# Patient Record
Sex: Female | Born: 1965 | Race: Black or African American | Hispanic: No | Marital: Married | Smoking: Current every day smoker
Health system: Southern US, Community
[De-identification: ages and names within clinical notes are randomized; demographics above are authoritative.]

## PROBLEM LIST (undated history)

## (undated) DIAGNOSIS — F319 Bipolar disorder, unspecified: Secondary | ICD-10-CM

## (undated) DIAGNOSIS — B1920 Unspecified viral hepatitis C without hepatic coma: Secondary | ICD-10-CM

## (undated) DIAGNOSIS — Z800 Family history of malignant neoplasm of digestive organs: Secondary | ICD-10-CM

## (undated) DIAGNOSIS — K219 Gastro-esophageal reflux disease without esophagitis: Secondary | ICD-10-CM

## (undated) HISTORY — DX: Gastro-esophageal reflux disease without esophagitis: K21.9

## (undated) HISTORY — PX: COLON SURGERY: SHX602

## (undated) HISTORY — DX: Bipolar disorder, unspecified: F31.9

## (undated) HISTORY — DX: Unspecified viral hepatitis C without hepatic coma: B19.20

## (undated) HISTORY — DX: Family history of malignant neoplasm of digestive organs: Z80.0

## (undated) HISTORY — PX: OTHER SURGICAL HISTORY: SHX169

---

## 2011-11-22 ENCOUNTER — Emergency Department: Payer: Self-pay | Admitting: Emergency Medicine

## 2011-11-22 LAB — COMPREHENSIVE METABOLIC PANEL
Albumin: 3.9 g/dL (ref 3.4–5.0)
Anion Gap: 7 (ref 7–16)
Calcium, Total: 9 mg/dL (ref 8.5–10.1)
Chloride: 105 mmol/L (ref 98–107)
Creatinine: 0.82 mg/dL (ref 0.60–1.30)
Potassium: 3.1 mmol/L — ABNORMAL LOW (ref 3.5–5.1)
Total Protein: 8.1 g/dL (ref 6.4–8.2)

## 2011-11-22 LAB — URINALYSIS, COMPLETE
Glucose,UR: NEGATIVE mg/dL (ref 0–75)
Nitrite: NEGATIVE
Ph: 5 (ref 4.5–8.0)
Specific Gravity: 1.026 (ref 1.003–1.030)
Squamous Epithelial: 1
WBC UR: 3 /HPF (ref 0–5)

## 2011-11-22 LAB — CBC
HCT: 44.7 % (ref 35.0–47.0)
MCH: 27.6 pg (ref 26.0–34.0)
MCV: 85 fL (ref 80–100)
Platelet: 271 10*3/uL (ref 150–440)
RDW: 13.2 % (ref 11.5–14.5)

## 2011-11-22 LAB — DRUG SCREEN, URINE
Cocaine Metabolite,Ur ~~LOC~~: POSITIVE (ref ?–300)
MDMA (Ecstasy)Ur Screen: POSITIVE (ref ?–500)
Methadone, Ur Screen: NEGATIVE (ref ?–300)
Phencyclidine (PCP) Ur S: NEGATIVE (ref ?–25)
Tricyclic, Ur Screen: NEGATIVE (ref ?–1000)

## 2011-11-22 LAB — ETHANOL
Ethanol %: <0.003 % (ref 0.000–0.080)
Ethanol: <3 mg/dL

## 2011-11-22 LAB — MAGNESIUM: Magnesium: 2 mg/dL

## 2011-11-22 LAB — TSH: Thyroid Stimulating Horm: 0.13 u[IU]/mL — ABNORMAL LOW

## 2012-02-07 ENCOUNTER — Emergency Department: Payer: Self-pay | Admitting: Emergency Medicine

## 2012-05-02 ENCOUNTER — Inpatient Hospital Stay: Payer: Self-pay | Admitting: Psychiatry

## 2012-05-02 LAB — URINALYSIS, COMPLETE
Bacteria: NONE SEEN
Bilirubin,UR: NEGATIVE
Ketone: NEGATIVE
Ph: 5 (ref 4.5–8.0)
RBC,UR: 4 /HPF (ref 0–5)
Squamous Epithelial: 6
WBC UR: 30 /HPF (ref 0–5)

## 2012-05-02 LAB — CBC
HGB: 13.3 g/dL (ref 12.0–16.0)
MCH: 29 pg (ref 26.0–34.0)
MCHC: 33.3 g/dL (ref 32.0–36.0)
Platelet: 271 10*3/uL (ref 150–440)
RBC: 4.58 10*6/uL (ref 3.80–5.20)

## 2012-05-02 LAB — COMPREHENSIVE METABOLIC PANEL
Albumin: 3.7 g/dL (ref 3.4–5.0)
Alkaline Phosphatase: 88 U/L (ref 50–136)
Anion Gap: 10 (ref 7–16)
BUN: 6 mg/dL — ABNORMAL LOW (ref 7–18)
Creatinine: 0.62 mg/dL (ref 0.60–1.30)
Glucose: 71 mg/dL (ref 65–99)
Osmolality: 281 (ref 275–301)
SGOT(AST): 33 U/L (ref 15–37)
SGPT (ALT): 26 U/L (ref 12–78)
Total Protein: 7.5 g/dL (ref 6.4–8.2)

## 2012-05-02 LAB — DRUG SCREEN, URINE
Barbiturates, Ur Screen: NEGATIVE (ref ?–200)
Cannabinoid 50 Ng, Ur ~~LOC~~: NEGATIVE (ref ?–50)
Cocaine Metabolite,Ur ~~LOC~~: NEGATIVE (ref ?–300)
Methadone, Ur Screen: NEGATIVE (ref ?–300)
Opiate, Ur Screen: NEGATIVE (ref ?–300)
Phencyclidine (PCP) Ur S: NEGATIVE (ref ?–25)
Tricyclic, Ur Screen: NEGATIVE (ref ?–1000)

## 2012-05-03 LAB — LIPID PANEL
Cholesterol: 168 mg/dL (ref 0–200)
HDL Cholesterol: 66 mg/dL — ABNORMAL HIGH (ref 40–60)
Ldl Cholesterol, Calc: 89 mg/dL (ref 0–100)
VLDL Cholesterol, Calc: 13 mg/dL (ref 5–40)

## 2012-05-03 LAB — TSH: Thyroid Stimulating Horm: 0.315 u[IU]/mL — ABNORMAL LOW

## 2012-05-24 ENCOUNTER — Emergency Department: Payer: Self-pay | Admitting: Emergency Medicine

## 2012-05-29 LAB — CBC
HCT: 45.9 % (ref 35.0–47.0)
MCH: 29.6 pg (ref 26.0–34.0)
MCHC: 33.5 g/dL (ref 32.0–36.0)
MCV: 88 fL (ref 80–100)
Platelet: 231 10*3/uL (ref 150–440)
RDW: 13.7 % (ref 11.5–14.5)

## 2012-05-29 LAB — URINALYSIS, COMPLETE
Blood: NEGATIVE
Nitrite: NEGATIVE
Ph: 6 (ref 4.5–8.0)
Protein: NEGATIVE
Specific Gravity: 1.003 (ref 1.003–1.030)
Squamous Epithelial: 4

## 2012-05-29 LAB — COMPREHENSIVE METABOLIC PANEL
Albumin: 4.1 g/dL (ref 3.4–5.0)
Alkaline Phosphatase: 96 U/L (ref 50–136)
Anion Gap: 10 (ref 7–16)
Calcium, Total: 9.4 mg/dL (ref 8.5–10.1)
Chloride: 103 mmol/L (ref 98–107)
Co2: 25 mmol/L (ref 21–32)
EGFR (African American): >60
EGFR (Non-African Amer.): >60
Glucose: 69 mg/dL (ref 65–99)
Potassium: 3.7 mmol/L (ref 3.5–5.1)
SGOT(AST): 23 U/L (ref 15–37)
SGPT (ALT): 15 U/L (ref 12–78)
Total Protein: 8.6 g/dL — ABNORMAL HIGH (ref 6.4–8.2)

## 2012-05-29 LAB — DRUG SCREEN, URINE
Benzodiazepine, Ur Scrn: NEGATIVE (ref ?–200)
Cocaine Metabolite,Ur ~~LOC~~: NEGATIVE (ref ?–300)
Opiate, Ur Screen: NEGATIVE (ref ?–300)
Phencyclidine (PCP) Ur S: NEGATIVE (ref ?–25)
Tricyclic, Ur Screen: NEGATIVE (ref ?–1000)

## 2012-05-29 LAB — PREGNANCY, URINE: Pregnancy Test, Urine: NEGATIVE m[IU]/mL

## 2012-05-29 LAB — ETHANOL: Ethanol %: <0.003 % (ref 0.000–0.080)

## 2012-06-02 ENCOUNTER — Inpatient Hospital Stay: Payer: Self-pay | Admitting: Psychiatry

## 2012-06-03 LAB — URINALYSIS, COMPLETE
Bacteria: NONE SEEN
Blood: NEGATIVE
Ketone: NEGATIVE
Leukocyte Esterase: NEGATIVE
Protein: NEGATIVE
RBC,UR: 1 /HPF (ref 0–5)
Specific Gravity: 1.004 (ref 1.003–1.030)
Squamous Epithelial: <1

## 2012-06-06 LAB — VALPROIC ACID LEVEL: Valproic Acid: 107 ug/mL — ABNORMAL HIGH

## 2012-06-06 LAB — AMMONIA: Ammonia, Plasma: 50 mcmol/L — ABNORMAL HIGH (ref 11–32)

## 2012-06-18 LAB — COMPREHENSIVE METABOLIC PANEL
Albumin: 3.3 g/dL — ABNORMAL LOW (ref 3.4–5.0)
BUN: 5 mg/dL — ABNORMAL LOW (ref 7–18)
Bilirubin,Total: 0.2 mg/dL (ref 0.2–1.0)
Calcium, Total: 8.5 mg/dL (ref 8.5–10.1)
Chloride: 102 mmol/L (ref 98–107)
Co2: 29 mmol/L (ref 21–32)
Creatinine: 0.72 mg/dL (ref 0.60–1.30)
EGFR (Non-African Amer.): >60
Potassium: 4.1 mmol/L (ref 3.5–5.1)
Sodium: 139 mmol/L (ref 136–145)
Total Protein: 7.4 g/dL (ref 6.4–8.2)

## 2012-06-18 LAB — CBC
HCT: 36.6 % (ref 35.0–47.0)
HGB: 12 g/dL (ref 12.0–16.0)
Platelet: 203 10*3/uL (ref 150–440)
RBC: 4.13 10*6/uL (ref 3.80–5.20)
WBC: 4 10*3/uL (ref 3.6–11.0)

## 2012-06-18 LAB — AMMONIA: Ammonia, Plasma: 42 mcmol/L — ABNORMAL HIGH (ref 11–32)

## 2012-10-02 ENCOUNTER — Emergency Department: Payer: Self-pay | Admitting: Emergency Medicine

## 2012-10-02 LAB — COMPREHENSIVE METABOLIC PANEL
Alkaline Phosphatase: 89 U/L (ref 50–136)
BUN: 7 mg/dL (ref 7–18)
Bilirubin,Total: 0.4 mg/dL (ref 0.2–1.0)
Calcium, Total: 8.9 mg/dL (ref 8.5–10.1)
Chloride: 104 mmol/L (ref 98–107)
Co2: 30 mmol/L (ref 21–32)
Creatinine: 0.71 mg/dL (ref 0.60–1.30)
EGFR (African American): >60
Glucose: 97 mg/dL (ref 65–99)
Potassium: 3.8 mmol/L (ref 3.5–5.1)
SGOT(AST): 17 U/L (ref 15–37)
SGPT (ALT): 14 U/L (ref 12–78)
Sodium: 139 mmol/L (ref 136–145)
Total Protein: 7.4 g/dL (ref 6.4–8.2)

## 2012-10-02 LAB — DRUG SCREEN, URINE
Amphetamines, Ur Screen: NEGATIVE (ref ?–1000)
Cannabinoid 50 Ng, Ur ~~LOC~~: NEGATIVE (ref ?–50)
Cocaine Metabolite,Ur ~~LOC~~: NEGATIVE (ref ?–300)
Methadone, Ur Screen: NEGATIVE (ref ?–300)
Phencyclidine (PCP) Ur S: NEGATIVE (ref ?–25)

## 2012-10-02 LAB — URINALYSIS, COMPLETE
Bacteria: NONE SEEN
Ph: 6 (ref 4.5–8.0)
Protein: NEGATIVE
RBC,UR: 1 /HPF (ref 0–5)
Specific Gravity: 1.024 (ref 1.003–1.030)
WBC UR: 2 /HPF (ref 0–5)

## 2012-10-02 LAB — TSH: Thyroid Stimulating Horm: 0.65 u[IU]/mL

## 2012-10-02 LAB — VALPROIC ACID LEVEL: Valproic Acid: 69 ug/mL

## 2012-10-02 LAB — CBC
HCT: 40.3 % (ref 35.0–47.0)
HGB: 13.6 g/dL (ref 12.0–16.0)
MCH: 30.3 pg (ref 26.0–34.0)
MCV: 90 fL (ref 80–100)
RDW: 12.1 % (ref 11.5–14.5)

## 2012-10-02 LAB — ETHANOL: Ethanol %: <0.003 % (ref 0.000–0.080)

## 2014-10-19 NOTE — Consult Note (Signed)
PATIENT NAME:  [name] Ball, [name] Ball MR#:  [id number] DATE OF BIRTH:  1966-03-21  DATE OF CONSULTATION:  05/31/2012  REFERRING PHYSICIAN:   CONSULTING PHYSICIAN:  Devion Chriscoe K. Henrietta Cieslewicz, MD  SUBJECTIVE: Staff reports that the patient has been agitated and upset during early hours of the morning but currently she has calmed down. The patient was started on her medications from the group home which will be continued.   OBJECTIVE: The patient is seen laying in the bed, appears sleepy. She is alert and oriented but she continues to have delusional thinking and some of the delusions are fixed and gets agitated during the day. Currently she is calm. Behavior is unpredictable and she can get very upset and argumentative with agitation. Insight and judgment guarded versus impaired at this time.   IMPRESSION: Schizoaffective disorder with psychosis.   PLAN: Continue current medications and watch. When the patient is stabilized, she will be considered to be moved to inpatient unit for further help.   ____________________________ Wallace Cullens. Franchot Mimes, MD skc:drc D: 05/31/2012 20:36:32 ET T: 06/01/2012 11:43:17 ET JOB#: [id number]  cc: Arlyn Leak K. Franchot Mimes, MD, <Dictator> Dewain Penning MD ELECTRONICALLY SIGNED 06/01/2012 16:26
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
## 2014-10-19 NOTE — Consult Note (Signed)
Brief Consult Note: Diagnosis: cocaine abuse.   Patient was seen by consultant.   Consult note dictated.   Discussed with Attending MD.   Comments: Psychiatry: Patient seen. She is currently dening any suicidal ideation and is calm and cooperative. Currently lucid. Patient not acutely dangerous and not likely to benefit from hospital. Releaase IVC. Advise dc from ER.  Electronic Signatures: Gonzella Lex (MD)  (Signed 08-Aug-13 17:37)  Authored: Brief Consult Note   Last Updated: 08-Aug-13 17:37 by Gonzella Lex (MD)

## 2014-10-19 NOTE — Discharge Summary (Signed)
PATIENT NAME:  [name] Ball, [name] Ball MR#:  [id number] DATE OF BIRTH:  09/07/1965  DATE OF ADMISSION:  05/02/2012 DATE OF DISCHARGE:  05/09/2012  HOSPITAL COURSE: See dictated history and physical for details of admission. This is a 49 year old woman with a history of schizophrenia who was petitioned in the Emergency Room because of her obvious psychosis and bizarre behavior. Her insight remained impaired for most of her hospital stay. She was often argumentative. She was not, however, hostile or violent and did not display any suicidal behavior. She was passively cooperative with medication but did not participate very well for many days with programming. She was given daily information and supportive therapy about the diagnosis and the purpose of treatment. We also worked with her around her living situation. The patient does have a guardian and is going to be seen by the ACT team at her group home but the responsibility has recently changed and they are only barely beginning to be familiar with her. Eventually the patient was able to have a reasonable agreement and negotiate medicine that she thought was appropriate. It turned out that she thought that Risperdal and Depakote were appropriate medications. I thought that was a completely reasonable choice as well and her medicine was changed to Risperdal and Depakote. She tolerated these fine and actually did seem to be less psychotic and less bizarre, although if one got her on her delusional topics they were still present. 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 Fortunately, it turns out her Hepatitis B, her syphilis, and her HIV were all negative. Hepatitis C was negative. She had concerns about her thyroid function which is understandable given her appearance which does look hyperthyroid. However, it did not actually  appear from the testing that her thyroid condition required any further change. She had slightly low TSH. She is referred back to her outpatient physician for follow-up treatment with this. Prior to discharge the patient was counseled about the use of long-acting injectable medications. She actually consented to have a Risperdal decanoate shot done and was given 37.5 mg of Risperdal Sustenna prior to discharge. She tolerated this fine. The ACT team felt more confident with her likelihood of outpatient compliance. All in all the patient seemed to be functioning much better, have a more reasonable plan for the future, show good insight about the importance of staying on medication and staying away from drugs. No acute dangerousness at discharge.   LABORATORY DATA: CBC on admission normal. Chemistry panel just showed a slightly low BUN at 6, chloride slightly elevated at 108. Alcohol undetectable. TSH slightly low at 0.35. Urinalysis showed positive leukocyte esterase, multiple white blood cells. Drug screen was positive for MDMA. Lipid panel showed an elevated HDL at 66, total cholesterol 168, triglycerides 66. Generally good news. Follow-up thyroid was still only slightly low at 0.3. Thyroid panel showed that while she had a low TSH her T4 was normal at 8.4 as was her T3 uptake and free thyroxine. HIV test was nonreactive. Hepatitis C test was negative. Hepatitis B surface antigen was negative. RPR titer was nonreactive.   DISCHARGE MEDICATIONS:  1. Risperdal long-acting 37.5 mg q.2 weeks with the next dose due about November 20th.   2. Depakote 500 mg per day.  3. Doxycycline 100 mg q.12 hours for another 7 days. 4. Risperdal oral 1 mg twice a day.  5. Desyrel 100  mg at night.  6. Hydroxyzine 50 mg q.4 hours p.r.n. for agitation.  7. The patient was treated with Macrodantin for five days in the hospital for the urinary tract infection.   MENTAL STATUS EXAM AT DISCHARGE: Reasonably well groomed, casually  dressed woman, looks her stated age. Good eye contact. Normal psychomotor activity during the interview, although at times she will still rock herself back and forth when she's sitting alone. It does not appear to be tardive dyskinesia or akathisia. Affect was euthymic and reactive. Smiling. Thoughts were lucid. Generally no loosening of associations. No bizarre content to her speech. Still has evidence of some delusional content if one asks about the right things such as whether she was a secret agent when she was a baby. Denies auditory or visual hallucinations. Denies suicidal or homicidal ideation. Baseline intelligence at least average, possibly elevated. Short and long-term memory appear to be grossly intact. Insight and judgment still a little bit impaired but certainly better.   DISPOSITION: She is being discharged back to a group home with follow-up by the ACT team.   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   SECONDARY DIAGNOSES:  AXIS I: History of polysubstance dependence, in early remission.   AXIS II: Deferred.   AXIS III: Urinary tract infection, currently treated.   AXIS IV: Severe from chronic burden of illness, having to change her environment recently, being away from her family.   AXIS V: Functioning at time of discharge 56.   ____________________________ Gonzella Lex, MD jtc:drc D: 05/09/2012 14:51:50 ET T: 05/10/2012 15:22:12 ET JOB#: [id number]  cc: Gonzella Lex, MD, <Dictator> Gonzella Lex MD ELECTRONICALLY SIGNED 05/12/2012 10:48
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
## 2014-10-24 NOTE — Consult Note (Signed)
Brief Consult Note: Diagnosis: schizophrenia.   Patient was seen by consultant.   Consult note dictated.   Comments: Psychiatry: Patient seen.She has a past history of schizophrenia and substance abuse. Recent move to area. Called 911 herself to come to ER. Complains of anxiety. Denies any suicidal or homicidal ideation. No active positive psychotic symptoms. Currently calm. Alreadt set up for ACT treatment at group home. Suggest she beb discharged back to her group home. She agrees.  Electronic Signatures: Gonzella Lex (MD)  (Signed 24-May-13 15:45)  Authored: Brief Consult Note   Last Updated: 24-May-13 15:45 by Gonzella Lex (MD)

## 2016-03-14 DIAGNOSIS — K219 Gastro-esophageal reflux disease without esophagitis: Secondary | ICD-10-CM | POA: Insufficient documentation

## 2016-03-14 DIAGNOSIS — K2100 Gastro-esophageal reflux disease with esophagitis, without bleeding: Secondary | ICD-10-CM

## 2016-03-14 DIAGNOSIS — B1920 Unspecified viral hepatitis C without hepatic coma: Secondary | ICD-10-CM | POA: Insufficient documentation

## 2016-03-14 DIAGNOSIS — F319 Bipolar disorder, unspecified: Secondary | ICD-10-CM | POA: Insufficient documentation

## 2016-03-14 DIAGNOSIS — F3111 Bipolar disorder, current episode manic without psychotic features, mild: Secondary | ICD-10-CM

## 2016-03-15 ENCOUNTER — Encounter: Payer: Self-pay | Admitting: Gastroenterology

## 2016-03-15 ENCOUNTER — Ambulatory Visit (INDEPENDENT_AMBULATORY_CARE_PROVIDER_SITE_OTHER): Payer: Medicaid Other | Admitting: Gastroenterology

## 2016-03-15 DIAGNOSIS — B1920 Unspecified viral hepatitis C without hepatic coma: Secondary | ICD-10-CM | POA: Diagnosis not present

## 2016-03-15 NOTE — Patient Instructions (Signed)
You are scheduled for a RUQ abdominal US/tissue elastography at [facility] on Wednesday, Sept 20th @ 9:30am. Please arrive at 9:15am and check in at the medical mall. You are NOT to eat or drink Tuesday after midnight. If you need to rescheduled for any reason, please call [phone].
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
## 2016-03-21 ENCOUNTER — Ambulatory Visit: Payer: Self-pay

## 2016-03-26 ENCOUNTER — Ambulatory Visit
Admission: RE | Admit: 2016-03-26 | Discharge: 2016-03-26 | Disposition: A | Discharge status: Home / Self Care | Payer: Medicaid Other | Source: Ambulatory Visit | Attending: Gastroenterology | Admitting: Gastroenterology

## 2016-03-26 DIAGNOSIS — B1920 Unspecified viral hepatitis C without hepatic coma: Secondary | ICD-10-CM | POA: Diagnosis present

## 2016-03-26 DIAGNOSIS — R932 Abnormal findings on diagnostic imaging of liver and biliary tract: Secondary | ICD-10-CM | POA: Insufficient documentation

## 2016-03-28 ENCOUNTER — Telehealth: Payer: Self-pay

## 2016-03-28 NOTE — Telephone Encounter (Signed)
Contacted group home and results given to nurse. Called [phone].

## 2016-03-28 NOTE — Telephone Encounter (Signed)
Opened encounter in error  

## 2016-03-28 NOTE — Telephone Encounter (Signed)
-----   Message from Lucilla Lame, MD sent at 03/27/2016  7:58 AM EDT ----- Let this patient know that her hepatitis C viral load is negative and her antibodies positive meaning that she has either been treated or resolved the infection on her own. She should have her viral level checked again in 1 year.

## 2016-03-30 LAB — CBC WITH DIFFERENTIAL/PLATELET
BASOS ABS: 0 10*3/uL (ref 0.0–0.2)
BASOS: 0 %
EOS (ABSOLUTE): 0.1 10*3/uL (ref 0.0–0.4)
Eos: 2 %
Hematocrit: 41.4 % (ref 34.0–46.6)
Hemoglobin: 13.9 g/dL (ref 11.1–15.9)
Immature Grans (Abs): 0 10*3/uL (ref 0.0–0.1)
Immature Granulocytes: 0 %
Lymphocytes Absolute: 2.2 10*3/uL (ref 0.7–3.1)
Lymphs: 38 %
MCH: 29.6 pg (ref 26.6–33.0)
MCHC: 33.6 g/dL (ref 31.5–35.7)
MCV: 88 fL (ref 79–97)
MONOS ABS: 0.5 10*3/uL (ref 0.1–0.9)
Monocytes: 9 %
NEUTROS ABS: 2.8 10*3/uL (ref 1.4–7.0)
Neutrophils: 51 %
PLATELETS: 288 10*3/uL (ref 150–379)
RBC: 4.7 x10E6/uL (ref 3.77–5.28)
RDW: 13.6 % (ref 12.3–15.4)
WBC: 5.7 10*3/uL (ref 3.4–10.8)

## 2016-03-30 LAB — HEPATIC FUNCTION PANEL
ALT: 6 IU/L (ref 0–32)
AST: 14 IU/L (ref 0–40)
Albumin: 4.3 g/dL (ref 3.5–5.5)
Alkaline Phosphatase: 93 IU/L (ref 39–117)
Bilirubin Total: 0.4 mg/dL (ref 0.0–1.2)
Bilirubin, Direct: 0.11 mg/dL (ref 0.00–0.40)
Total Protein: 7.5 g/dL (ref 6.0–8.5)

## 2016-03-30 LAB — HEPATITIS B SURFACE ANTIGEN: HEP B S AG: NEGATIVE

## 2016-03-30 LAB — ANA: Anti Nuclear Antibody(ANA): NEGATIVE

## 2016-03-30 LAB — ANTI-SMOOTH MUSCLE ANTIBODY, IGG: Smooth Muscle Ab: 37 Units — ABNORMAL HIGH (ref 0–19)

## 2016-03-30 LAB — MITOCHONDRIAL ANTIBODIES: Mitochondrial Ab: 12.2 Units (ref 0.0–20.0)

## 2016-03-30 LAB — HEPATITIS A ANTIBODY, TOTAL: HEP A TOTAL AB: NEGATIVE

## 2016-03-30 LAB — HEPATITIS C GENOTYPE

## 2016-03-30 LAB — HCV RT-PCR, QUANT (NON-GRAPH)

## 2016-03-30 LAB — IGG, IGA, IGM
IgA/Immunoglobulin A, Serum: 198 mg/dL (ref 87–352)
IgG (Immunoglobin G), Serum: 1453 mg/dL (ref 700–1600)
IgM (Immunoglobulin M), Srm: 65 mg/dL (ref 26–217)

## 2016-03-30 LAB — HEPATITIS B SURFACE ANTIBODY,QUALITATIVE: HEP B SURFACE AB, QUAL: NONREACTIVE

## 2016-03-30 LAB — PROTIME-INR
INR: 1 (ref 0.8–1.2)
PROTHROMBIN TIME: 10.8 s (ref 9.1–12.0)

## 2016-03-30 LAB — CERULOPLASMIN: Ceruloplasmin: 36.9 mg/dL (ref 19.0–39.0)

## 2016-03-30 LAB — HEPATITIS C ANTIBODY: Hep C Virus Ab: 4.7 s/co ratio — ABNORMAL HIGH (ref 0.0–0.9)

## 2016-03-30 LAB — ALPHA-1-ANTITRYPSIN: A1 ANTITRYPSIN: 144 mg/dL (ref 90–200)

## 2017-03-27 ENCOUNTER — Emergency Department (HOSPITAL_COMMUNITY): Payer: Medicaid Other

## 2017-03-27 ENCOUNTER — Emergency Department (HOSPITAL_COMMUNITY)
Admission: EM | Admit: 2017-03-27 | Discharge: 2017-03-27 | Disposition: A | Discharge status: Home / Self Care | Payer: Medicaid Other | Attending: Emergency Medicine | Admitting: Emergency Medicine

## 2017-03-27 ENCOUNTER — Encounter (HOSPITAL_COMMUNITY): Payer: Self-pay | Admitting: *Deleted

## 2017-03-27 DIAGNOSIS — Z79899 Other long term (current) drug therapy: Secondary | ICD-10-CM | POA: Insufficient documentation

## 2017-03-27 DIAGNOSIS — F17200 Nicotine dependence, unspecified, uncomplicated: Secondary | ICD-10-CM | POA: Diagnosis not present

## 2017-03-27 DIAGNOSIS — F22 Delusional disorders: Secondary | ICD-10-CM | POA: Insufficient documentation

## 2017-03-27 DIAGNOSIS — R42 Dizziness and giddiness: Secondary | ICD-10-CM | POA: Insufficient documentation

## 2017-03-27 LAB — URINALYSIS, ROUTINE W REFLEX MICROSCOPIC
BILIRUBIN URINE: NEGATIVE
Glucose, UA: NEGATIVE mg/dL
Hgb urine dipstick: NEGATIVE
KETONES UR: 5 mg/dL — AB
LEUKOCYTES UA: NEGATIVE
Nitrite: NEGATIVE
PH: 5 (ref 5.0–8.0)
Protein, ur: NEGATIVE mg/dL
SPECIFIC GRAVITY, URINE: 1.024 (ref 1.005–1.030)

## 2017-03-27 LAB — COMPREHENSIVE METABOLIC PANEL
ALBUMIN: 3.8 g/dL (ref 3.5–5.0)
ALT: 14 U/L (ref 14–54)
ANION GAP: 10 (ref 5–15)
AST: 26 U/L (ref 15–41)
Alkaline Phosphatase: 59 U/L (ref 38–126)
BUN: 15 mg/dL (ref 6–20)
CHLORIDE: 103 mmol/L (ref 101–111)
CO2: 26 mmol/L (ref 22–32)
CREATININE: 0.73 mg/dL (ref 0.44–1.00)
Calcium: 9.4 mg/dL (ref 8.9–10.3)
GFR calc non Af Amer: >60 mL/min (ref >60)
Glucose, Bld: 80 mg/dL (ref 65–99)
POTASSIUM: 4.1 mmol/L (ref 3.5–5.1)
SODIUM: 139 mmol/L (ref 135–145)
Total Bilirubin: 0.6 mg/dL (ref 0.3–1.2)
Total Protein: 7.2 g/dL (ref 6.5–8.1)

## 2017-03-27 LAB — CBC WITH DIFFERENTIAL/PLATELET
BASOS PCT: 0 %
Basophils Absolute: 0 10*3/uL (ref 0.0–0.1)
EOS ABS: 0 10*3/uL (ref 0.0–0.7)
EOS PCT: 1 %
HCT: 39.9 % (ref 36.0–46.0)
Hemoglobin: 13.3 g/dL (ref 12.0–15.0)
LYMPHS ABS: 1.4 10*3/uL (ref 0.7–4.0)
Lymphocytes Relative: 33 %
MCH: 29.9 pg (ref 26.0–34.0)
MCHC: 33.3 g/dL (ref 30.0–36.0)
MCV: 89.7 fL (ref 78.0–100.0)
MONO ABS: 0.5 10*3/uL (ref 0.1–1.0)
MONOS PCT: 11 %
Neutro Abs: 2.3 10*3/uL (ref 1.7–7.7)
Neutrophils Relative %: 55 %
Platelets: 184 10*3/uL (ref 150–400)
RBC: 4.45 MIL/uL (ref 3.87–5.11)
RDW: 12.1 % (ref 11.5–15.5)
WBC: 4.1 10*3/uL (ref 4.0–10.5)

## 2017-03-27 LAB — RAPID URINE DRUG SCREEN, HOSP PERFORMED
AMPHETAMINES: NOT DETECTED
BENZODIAZEPINES: POSITIVE — AB
Barbiturates: NOT DETECTED
COCAINE: NOT DETECTED
OPIATES: NOT DETECTED
TETRAHYDROCANNABINOL: NOT DETECTED

## 2017-03-27 LAB — I-STAT BETA HCG BLOOD, ED (MC, WL, AP ONLY)

## 2017-03-27 LAB — ETHANOL

## 2017-03-27 MED ORDER — QUETIAPINE FUMARATE 100 MG PO TABS
50.0000 mg | ORAL_TABLET | Freq: Every day | ORAL | Status: DC
Start: 2017-03-27 — End: 2017-03-27
  Administered 2017-03-27: 50 mg via ORAL
  Filled 2017-03-27: qty 1

## 2017-03-27 MED ORDER — QUETIAPINE FUMARATE 50 MG PO TABS: 50.0000 mg | ORAL_TABLET | Freq: Every day | ORAL | 0 refills | Status: DC

## 2017-03-27 NOTE — ED Notes (Signed)
Has eaten meal   A bit restless but easily re-directed

## 2017-03-27 NOTE — ED Notes (Signed)
Meal provided 

## 2017-03-27 NOTE — ED Notes (Signed)
Awaiting reeval and disposition

## 2017-03-27 NOTE — ED Notes (Signed)
Pt insistant that she go outside to smoke

## 2017-03-27 NOTE — ED Notes (Signed)
Call to [facility] they will call back

## 2017-03-27 NOTE — ED Notes (Signed)
Out of bed awaiting results

## 2017-03-27 NOTE — ED Triage Notes (Signed)
Pt brought in by [facility] from [facility]. EMS reports that pt's medication, Haldol was changed on 03/20/17 and since then pt has dizzy, stumbling around and unsteady on her feet. EMS reports that pt has not fallen since this change in medication. Pt is alert and verbal, but pt is not able to carry conversation without getting very sidetracked. Pt alert to self and place, disoriented to time.

## 2017-03-28 ENCOUNTER — Emergency Department (HOSPITAL_COMMUNITY)
Admission: EM | Admit: 2017-03-28 | Discharge: 2017-03-28 | Disposition: A | Discharge status: Home / Self Care | Payer: Medicaid Other | Attending: Emergency Medicine | Admitting: Emergency Medicine

## 2017-03-28 ENCOUNTER — Encounter (HOSPITAL_COMMUNITY): Payer: Self-pay

## 2017-03-28 DIAGNOSIS — Z79899 Other long term (current) drug therapy: Secondary | ICD-10-CM | POA: Diagnosis not present

## 2017-03-28 DIAGNOSIS — R4589 Other symptoms and signs involving emotional state: Secondary | ICD-10-CM | POA: Diagnosis not present

## 2017-03-28 DIAGNOSIS — F319 Bipolar disorder, unspecified: Secondary | ICD-10-CM | POA: Insufficient documentation

## 2017-03-28 DIAGNOSIS — F17200 Nicotine dependence, unspecified, uncomplicated: Secondary | ICD-10-CM | POA: Diagnosis not present

## 2017-03-28 DIAGNOSIS — F69 Unspecified disorder of adult personality and behavior: Secondary | ICD-10-CM

## 2017-03-28 MED ORDER — QUETIAPINE FUMARATE 100 MG PO TABS
50.0000 mg | ORAL_TABLET | Freq: Every day | ORAL | Status: DC
  Administered 2017-03-28: 50 mg via ORAL
  Filled 2017-03-28: qty 1

## 2017-03-28 NOTE — ED Provider Notes (Signed)
[facility] DEPT Provider Note   CSN: [id number] Arrival date & time: 03/28/17  1921     History   Chief Complaint Chief Complaint  Patient presents with  . Medical Clearance    HPI [name] Ball is a 51 y.o. female.  HPI   43yF sent for evaluation of aggressive behavior and not sleeping. Just evaluated for the same. Recommended seroquel be restarted but apparently hasnt even had it yet. From what I can tell, there doesn't seem to be an acute change otherwise.   Past Medical History:  Diagnosis Date  . Bipolar disorder ([place])   . GERD (gastroesophageal reflux disease)   . Hepatitis C virus infection without hepatic coma     Patient Active Problem List   Diagnosis Date Noted  . Hepatitis C virus infection without hepatic coma   . Bipolar disorder ([place])   . GERD (gastroesophageal reflux disease)     Past Surgical History:  Procedure Laterality Date  . abdominal cyst removed    . CESAREAN SECTION    . COLON SURGERY      OB History    No data available       Home Medications    Prior to Admission medications   Medication Sig Start Date End Date Taking? Authorizing Provider  divalproex (DEPAKOTE) 250 MG DR tablet Take 250 mg by mouth 3 (three) times daily.    [provider]  donepezil (ARICEPT) 5 MG tablet Take 5 mg by mouth at bedtime.    [provider]  haloperidol decanoate (HALDOL DECANOATE) 50 MG/ML injection Inject 100 mg into the muscle every 28 (twenty-eight) days.    [provider]  ibuprofen (ADVIL,MOTRIN) 200 MG tablet Take 200 mg by mouth every 6 (six) hours as needed.    [provider]  meclizine (ANTIVERT) 25 MG tablet Take 25 mg by mouth 2 (two) times daily.    [provider]  pantoprazole (PROTONIX) 20 MG tablet Take 20 mg by mouth daily.    [provider]  QUEtiapine (SEROQUEL) 50 MG tablet Take 1 tablet (50 mg total) by mouth at bedtime. 03/27/17   Mesner, Corene Cornea, MD    Family  History No family history on file.  Social History Social History  Substance Use Topics  . Smoking status: Current Every Day Smoker  . Smokeless tobacco: Never Used  . Alcohol use No     Allergies   Trazodone and nefazodone   Review of Systems Review of Systems  Level 5 caveat because of psychiatric illness.   Physical Exam Updated Vital Signs BP (!) 117/98 (BP Location: Left Arm)   Pulse 89   Temp 97.6 F (36.4 C) (Oral)   SpO2 97%   Physical Exam  Constitutional: She appears well-developed and well-nourished. No distress.  HENT:  Head: Normocephalic and atraumatic.  Eyes: Conjunctivae are normal. Right eye exhibits no discharge. Left eye exhibits no discharge.  Neck: Neck supple.  Cardiovascular: Normal rate, regular rhythm and normal heart sounds.  Exam reveals no gallop and no friction rub.   No murmur heard. Pulmonary/Chest: Effort normal and breath sounds normal. No respiratory distress.  Abdominal: Soft. She exhibits no distension. There is no tenderness.  Musculoskeletal: She exhibits no edema or tenderness.  Neurological: She is alert.  Skin: Skin is warm and dry.  Psychiatric:  Awake. Alert. Answers questions but most answers unintelligible. Generally cooperative.   Nursing note and vitals reviewed.    ED Treatments / Results  Labs (all labs  ordered are listed, but only abnormal results are displayed) Labs Reviewed - No data to display  EKG  EKG Interpretation None       Radiology Mr Brain Wo Contrast  Result Date: 03/27/2017 CLINICAL DATA:  Ataxia, stroke suspected EXAM: MRI HEAD WITHOUT CONTRAST TECHNIQUE: Multiplanar, multiecho pulse sequences of the brain and surrounding structures were obtained without intravenous contrast. COMPARISON:  None. FINDINGS: Brain: Image quality degraded by motion. The patient was not able to complete the study Ventricle size and cerebral volume normal. Negative for acute infarct. No significant chronic  ischemia. Negative for mass or edema. Vascular: Normal arterial flow void Skull and upper cervical spine: Negative Sinuses/Orbits: Mucous retention cyst right maxillary sinus. Remaining sinuses clear. Negative orbit Other: None IMPRESSION: Incomplete study, degraded by motion. No acute abnormality. Electronically Signed   By: Franchot Gallo M.D.   On: 03/27/2017 13:28   Dg Femur Min 2 Views Right  Result Date: 03/27/2017 CLINICAL DATA:  Several recent falls EXAM: RIGHT FEMUR 2 VIEWS COMPARISON:  None. FINDINGS: Frontal and lateral views were obtained. There is no appreciable fracture or dislocation. No abnormal periosteal reaction. There is slight narrowing in the hip joint region. There is slight patellofemoral joint space narrowing. No erosive change. 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 No fracture or dislocation. No abnormal periosteal reaction. Electronically Signed   By: Lowella Grip III M.D.   On: 03/27/2017 13:43    Procedures Procedures (including critical care time)  Medications Ordered in ED Medications - No data to display   Initial Impression / Assessment and Plan / ED Course  I have reviewed the triage vital signs and the nursing notes.  Pertinent labs & imaging results that were available during my care of the patient were reviewed by me and considered in my medical decision making (see chart for details).     51yF sent for behavior reasons after just evaluated for the same. Advised restarting on seroquel but apparently hasn't even taken it yet. No acute change since last evaluation. Needs time to be on meds. I do not feel she requires repeat psych or medical w/u at this point.   Final Clinical Impressions(s) / ED Diagnoses   Final diagnoses:  Behavior problem, adult    New Prescriptions New Prescriptions   No medications on file     Virgel Manifold, MD 04/16/17 1132

## 2017-03-28 NOTE — ED Triage Notes (Signed)
Pt is from [facility] home where she reportedly has been uncooperative, talking about "friends" in her room, hearing voices. Pt seen here yesterday for same.  Pt apparently had medication doses adjusted recently

## 2017-03-28 NOTE — ED Notes (Signed)
Spoke with [employer], asked about transportation,  She stated that she would have to call her supervisor.  Stated that she would call back.

## 2017-03-30 NOTE — ED Provider Notes (Signed)
[facility] DEPT Provider Note   CSN: [id number] Arrival date & time: 03/27/17  1142     History   Chief Complaint Chief Complaint  Patient presents with  . Dizziness    HPI [name] Ball is a 51 y.o. female.   Mental Health Problem  Presenting symptoms: delusional and paranoid behavior   Patient accompanied by:  Caregiver Degree of incapacity (severity):  Mild Onset quality:  Gradual Duration:  7 days Timing:  Constant Progression:  Worsening Chronicity:  Recurrent Context: recent medication change   Treatment compliance:  Some of the time Relieved by:  None tried Worsened by:  Nothing Ineffective treatments:  None tried   Past Medical History:  Diagnosis Date  . Bipolar disorder ([place])   . GERD (gastroesophageal reflux disease)   . Hepatitis C virus infection without hepatic coma     Patient Active Problem List   Diagnosis Date Noted  . Hepatitis C virus infection without hepatic coma   . Bipolar disorder ([place])   . GERD (gastroesophageal reflux disease)     Past Surgical History:  Procedure Laterality Date  . abdominal cyst removed    . CESAREAN SECTION    . COLON SURGERY      OB History    No data available       Home Medications    Prior to Admission medications   Medication Sig Start Date End Date Taking? Authorizing Provider  benztropine (COGENTIN) 0.5 MG tablet Take 0.5 mg by mouth 2 (two) times daily.    [provider]  diltiazem (DILACOR XR) 120 MG 24 hr capsule Take 120 mg by mouth daily.    [provider]  divalproex (DEPAKOTE) 250 MG DR tablet Take 750 mg by mouth at bedtime.     [provider]  haloperidol (HALDOL) 10 MG tablet Take 10 mg by mouth 3 (three) times daily.    [provider]  haloperidol decanoate (HALDOL DECANOATE) 100 MG/ML injection Inject 100 mg into the muscle every 28 (twenty-eight) days.    [provider]  ibuprofen (ADVIL,MOTRIN) 200 MG tablet Take 200-400 mg  by mouth every 4 (four) hours as needed for moderate pain.     [provider]  LORazepam (ATIVAN) 1 MG tablet Take 1 mg by mouth every 6 (six) hours as needed for anxiety.    [provider]  pantoprazole (PROTONIX) 20 MG tablet Take 20 mg by mouth daily.    [provider]  QUEtiapine (SEROQUEL) 50 MG tablet Take 50 mg by mouth at bedtime.    [provider]  traZODone (DESYREL) 100 MG tablet Take 100 mg by mouth at bedtime as needed for sleep.    [provider]  zolpidem (AMBIEN) 10 MG tablet Take 10 mg by mouth at bedtime.    [provider]    Family History No family history on file.  Social History Social History  Substance Use Topics  . Smoking status: Current Every Day Smoker  . Smokeless tobacco: Never Used  . Alcohol use No     Allergies   Trazodone and nefazodone   Review of Systems Review of Systems  Psychiatric/Behavioral: Positive for paranoia.  All other systems reviewed and are negative.    Physical Exam Updated Vital Signs BP 126/67 (BP Location: Left Arm)   Pulse 95   Temp 97.7 F (36.5 C) (Oral)   Resp 18   Ht 5\' 6"  (1.676 m)   Wt 81.6 kg (180 lb)  SpO2 100%   BMI 29.05 kg/m   Physical Exam  Constitutional: She is oriented to person, place, and time. She appears well-developed and well-nourished.  HENT:  Head: Normocephalic and atraumatic.  Eyes: Conjunctivae and EOM are normal.  Neck: Normal range of motion.  Cardiovascular: Normal rate and regular rhythm.   Pulmonary/Chest: No stridor. No respiratory distress.  Abdominal: Soft. Bowel sounds are normal. She exhibits no distension.  Neurological: She is alert and oriented to person, place, and time. No cranial nerve deficit. Coordination normal.  Skin: Skin is warm and dry.  Psychiatric: She is actively hallucinating. Thought content is paranoid and delusional. She expresses no homicidal and no suicidal ideation.  Nursing note and  vitals reviewed.    ED Treatments / Results  Labs (all labs ordered are listed, but only abnormal results are displayed) Labs Reviewed  RAPID URINE DRUG SCREEN, HOSP PERFORMED - Abnormal; Notable for the following:       Result Value   Benzodiazepines POSITIVE (*)    All other components within normal limits  URINALYSIS, ROUTINE W REFLEX MICROSCOPIC - Abnormal; Notable for the following:    Color, Urine AMBER (*)    APPearance TURBID (*)    Ketones, ur 5 (*)    Bacteria, UA RARE (*)    Squamous Epithelial / LPF 6-30 (*)    All other components within normal limits  COMPREHENSIVE METABOLIC PANEL  ETHANOL  CBC WITH DIFFERENTIAL/PLATELET  I-STAT BETA HCG BLOOD, ED ([facility], [facility], [facility] ONLY)    EKG  EKG Interpretation  Date/Time:  Wednesday March 27 2017 12:21:06 EDT Ventricular Rate:  77 PR Interval:  178 QRS Duration: 76 QT Interval:  370 QTC Calculation: 418 R Axis:   46 Text Interpretation:  Normal sinus rhythm Normal ECG Confirmed by Noemi Chapel [id number]) on 03/29/2017 7:41:55 AM       Radiology No results found.  Procedures Procedures (including critical care time)  Medications Ordered in ED Medications - No data to display   Initial Impression / Assessment and Plan / ED Course  I have reviewed the triage vital signs and the nursing notes.  Pertinent labs & imaging results that were available during my care of the patient were reviewed by me and considered in my medical decision making (see chart for details).     Worsening psychosis since being taken off of seroquel a few weeks ago. Started having ataxia as well and has some ataxia in ER. Rest of neuro exam ok. No ct scanner so got MRI of brain which was limited but normal. Workup negative. Suspect she has worsening delusions but is in a group home with a psychiatrist helping take care of her and thus is in a safe place and not in danger of harming herself. Will restart seroquel.   Final Clinical Impressions(s)  / ED Diagnoses   Final diagnoses:  Dizziness  Delusions [facility])    New Prescriptions Discharge Medication List as of 03/27/2017  4:36 PM       Joquan Lotz, Corene Cornea, MD 03/30/17 [phone]

## 2017-03-31 ENCOUNTER — Emergency Department (HOSPITAL_COMMUNITY)
Admission: EM | Admit: 2017-03-31 | Discharge: 2017-04-03 | Disposition: A | Discharge status: Other Acute Inpatient Hospital | Payer: Medicaid Other | Attending: Emergency Medicine | Admitting: Emergency Medicine

## 2017-03-31 ENCOUNTER — Encounter (HOSPITAL_COMMUNITY): Payer: Self-pay | Admitting: Cardiology

## 2017-03-31 DIAGNOSIS — Z79899 Other long term (current) drug therapy: Secondary | ICD-10-CM | POA: Insufficient documentation

## 2017-03-31 DIAGNOSIS — F17200 Nicotine dependence, unspecified, uncomplicated: Secondary | ICD-10-CM | POA: Insufficient documentation

## 2017-03-31 DIAGNOSIS — R440 Auditory hallucinations: Secondary | ICD-10-CM | POA: Diagnosis not present

## 2017-03-31 DIAGNOSIS — R4589 Other symptoms and signs involving emotional state: Secondary | ICD-10-CM | POA: Diagnosis present

## 2017-03-31 DIAGNOSIS — F319 Bipolar disorder, unspecified: Secondary | ICD-10-CM | POA: Insufficient documentation

## 2017-03-31 LAB — CBC WITH DIFFERENTIAL/PLATELET
Basophils Absolute: 0 10*3/uL (ref 0.0–0.1)
Basophils Relative: 0 %
Eosinophils Absolute: 0 10*3/uL (ref 0.0–0.7)
Eosinophils Relative: 0 %
HCT: 41.4 % (ref 36.0–46.0)
HEMOGLOBIN: 13.8 g/dL (ref 12.0–15.0)
LYMPHS ABS: 1.1 10*3/uL (ref 0.7–4.0)
LYMPHS PCT: 24 %
MCH: 30 pg (ref 26.0–34.0)
MCHC: 33.3 g/dL (ref 30.0–36.0)
MCV: 90 fL (ref 78.0–100.0)
MONOS PCT: 8 %
Monocytes Absolute: 0.4 10*3/uL (ref 0.1–1.0)
NEUTROS PCT: 67 %
Neutro Abs: 3.1 10*3/uL (ref 1.7–7.7)
Platelets: 189 10*3/uL (ref 150–400)
RBC: 4.6 MIL/uL (ref 3.87–5.11)
RDW: 12.6 % (ref 11.5–15.5)
WBC: 4.7 10*3/uL (ref 4.0–10.5)

## 2017-03-31 LAB — COMPREHENSIVE METABOLIC PANEL
ALBUMIN: 4.3 g/dL (ref 3.5–5.0)
ALK PHOS: 73 U/L (ref 38–126)
ALT: 17 U/L (ref 14–54)
ANION GAP: 13 (ref 5–15)
AST: 26 U/L (ref 15–41)
BILIRUBIN TOTAL: 0.7 mg/dL (ref 0.3–1.2)
BUN: 10 mg/dL (ref 6–20)
CALCIUM: 9.5 mg/dL (ref 8.9–10.3)
CO2: 27 mmol/L (ref 22–32)
Chloride: 101 mmol/L (ref 101–111)
Creatinine, Ser: 0.78 mg/dL (ref 0.44–1.00)
GFR calc Af Amer: >60 mL/min (ref >60)
GFR calc non Af Amer: >60 mL/min (ref >60)
GLUCOSE: 104 mg/dL — AB (ref 65–99)
POTASSIUM: 3.5 mmol/L (ref 3.5–5.1)
SODIUM: 141 mmol/L (ref 135–145)
TOTAL PROTEIN: 7.8 g/dL (ref 6.5–8.1)

## 2017-03-31 LAB — ACETAMINOPHEN LEVEL: Acetaminophen (Tylenol), Serum: <10 ug/mL — ABNORMAL LOW (ref 10–30)

## 2017-03-31 LAB — SALICYLATE LEVEL: Salicylate Lvl: <7 mg/dL (ref 2.8–30.0)

## 2017-03-31 LAB — ETHANOL: Alcohol, Ethyl (B): <5 mg/dL (ref <10)

## 2017-03-31 MED ORDER — LORAZEPAM 1 MG PO TABS
1.0000 mg | ORAL_TABLET | Freq: Four times a day (QID) | ORAL | Status: DC | PRN
  Administered 2017-03-31 – 2017-04-02 (×4): 1 mg via ORAL
  Filled 2017-03-31 (×4): qty 1

## 2017-03-31 MED ORDER — DILTIAZEM HCL ER 120 MG PO CP24
120.0000 mg | ORAL_CAPSULE | Freq: Every day | ORAL | Status: DC
  Administered 2017-03-31 – 2017-04-03 (×4): 120 mg via ORAL
  Filled 2017-03-31 (×7): qty 1

## 2017-03-31 MED ORDER — ALUM & MAG HYDROXIDE-SIMETH 200-200-20 MG/5ML PO SUSP: 30.0000 mL | Freq: Four times a day (QID) | ORAL | Status: DC | PRN

## 2017-03-31 MED ORDER — ONDANSETRON HCL 4 MG PO TABS
4.0000 mg | ORAL_TABLET | Freq: Three times a day (TID) | ORAL | Status: DC | PRN
  Filled 2017-03-31: qty 1

## 2017-03-31 MED ORDER — BENZTROPINE MESYLATE 1 MG PO TABS
0.5000 mg | ORAL_TABLET | Freq: Two times a day (BID) | ORAL | Status: DC
  Administered 2017-03-31 – 2017-04-03 (×6): 0.5 mg via ORAL
  Filled 2017-03-31 (×7): qty 1

## 2017-03-31 MED ORDER — ACETAMINOPHEN 325 MG PO TABS: 650.0000 mg | ORAL_TABLET | ORAL | Status: DC | PRN

## 2017-03-31 MED ORDER — IBUPROFEN 400 MG PO TABS
200.0000 mg | ORAL_TABLET | ORAL | Status: DC | PRN
  Administered 2017-03-31: 400 mg via ORAL
  Filled 2017-03-31: qty 1

## 2017-03-31 MED ORDER — HALOPERIDOL 5 MG PO TABS
10.0000 mg | ORAL_TABLET | Freq: Three times a day (TID) | ORAL | Status: DC
  Administered 2017-03-31 – 2017-04-03 (×9): 10 mg via ORAL
  Filled 2017-03-31 (×10): qty 2

## 2017-03-31 MED ORDER — DIVALPROEX SODIUM 250 MG PO DR TAB
750.0000 mg | DELAYED_RELEASE_TABLET | Freq: Every day | ORAL | Status: DC
  Administered 2017-03-31 – 2017-04-02 (×3): 750 mg via ORAL
  Filled 2017-03-31 (×3): qty 3

## 2017-03-31 MED ORDER — PANTOPRAZOLE SODIUM 20 MG PO TBEC
20.0000 mg | DELAYED_RELEASE_TABLET | Freq: Every day | ORAL | Status: DC
  Administered 2017-03-31: 20 mg via ORAL
  Filled 2017-03-31 (×2): qty 1

## 2017-03-31 MED ORDER — DILTIAZEM HCL 30 MG PO TABS
ORAL_TABLET | ORAL | Status: AC
  Filled 2017-03-31: qty 1

## 2017-03-31 MED ORDER — ZOLPIDEM TARTRATE 5 MG PO TABS
10.0000 mg | ORAL_TABLET | Freq: Every day | ORAL | Status: DC
  Administered 2017-03-31 – 2017-04-02 (×3): 10 mg via ORAL
  Filled 2017-03-31 (×3): qty 2

## 2017-03-31 NOTE — ED Notes (Signed)
Received report on pt, pt lying supine on stretcher, no distress noted, resp even and non labored, sitter at bedside,

## 2017-03-31 NOTE — ED Notes (Signed)
Meal provided 
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
## 2017-03-31 NOTE — ED Provider Notes (Signed)
[facility] DEPT Provider Note   CSN: [id number] Arrival date & time: 03/31/17  1217     History   Chief Complaint Chief Complaint  Patient presents with  . Psychiatric Evaluation    HPI [name] Ball is a 51 y.o. female.  HPI  51 year old female with a history of hepatitis C and bipolar disorder presents from her group home with aggressive behavior. The history is limited as the patient appears to have active psychiatric disease and does not follow along well. I talked to one of the members of the group home who told me that for the past 1 week she's not been acting herself. She has been wandering around, taking her clothes off, being aggressive to staff, and talking about killing herself. She had her Seroquel removed by her psychiatrist about one week ago and staff thinks this is when this started to become apparent. She was seen in the ED 4 days ago and discharged home on Seroquel. Seen again a couple days ago and then discharge back. Because she continues to be aggressive she was sent back to the ER. No fevers at the facility.  To me patient denies suicidal thoughts. She complains of her right thigh hurting but cannot vomit what happened. She otherwise denies chest pain or headaches. Appears to be talking to someone else.  Past Medical History:  Diagnosis Date  . Bipolar disorder ([place])   . GERD (gastroesophageal reflux disease)   . Hepatitis C virus infection without hepatic coma     Patient Active Problem List   Diagnosis Date Noted  . Hepatitis C virus infection without hepatic coma   . Bipolar disorder ([place])   . GERD (gastroesophageal reflux disease)     Past Surgical History:  Procedure Laterality Date  . abdominal cyst removed    . CESAREAN SECTION    . COLON SURGERY      OB History    No data available       Home Medications    Prior to Admission medications   Medication Sig Start Date End Date Taking? Authorizing Provider  benztropine (COGENTIN)  0.5 MG tablet Take 0.5 mg by mouth 2 (two) times daily.   Yes [provider]  diltiazem (DILACOR XR) 120 MG 24 hr capsule Take 120 mg by mouth daily.   Yes [provider]  divalproex (DEPAKOTE) 250 MG DR tablet Take 750 mg by mouth at bedtime.    Yes [provider]  haloperidol (HALDOL) 10 MG tablet Take 10 mg by mouth 3 (three) times daily.   Yes [provider]  haloperidol decanoate (HALDOL DECANOATE) 100 MG/ML injection Inject 100 mg into the muscle every 28 (twenty-eight) days.   Yes [provider]  ibuprofen (ADVIL,MOTRIN) 200 MG tablet Take 200-400 mg by mouth every 4 (four) hours as needed for moderate pain.    Yes [provider]  LORazepam (ATIVAN) 1 MG tablet Take 1 mg by mouth every 6 (six) hours as needed for anxiety.   Yes [provider]  pantoprazole (PROTONIX) 20 MG tablet Take 20 mg by mouth daily.   Yes [provider]  QUEtiapine (SEROQUEL) 50 MG tablet Take 50 mg by mouth at bedtime.   Yes [provider]  traZODone (DESYREL) 100 MG tablet Take 100 mg by mouth at bedtime as needed for sleep.   Yes [provider]  zolpidem (AMBIEN) 10 MG tablet Take 10 mg by mouth at bedtime.   Yes [provider]  Family History History reviewed. No pertinent family history.  Social History Social History  Substance Use Topics  . Smoking status: Current Every Day Smoker  . Smokeless tobacco: Never Used  . Alcohol use No     Allergies   Trazodone and nefazodone   Review of Systems Review of Systems  Unable to perform ROS: Psychiatric disorder     Physical Exam Updated Vital Signs BP (!) 142/90   Pulse (!) 113   Temp 98.7 F (37.1 C) (Oral)   Resp 18   Ht 5\' 7"  (1.702 m)   Wt 81.6 kg (180 lb)   SpO2 100%   BMI 28.19 kg/m   Physical Exam  Constitutional: She appears well-developed and well-nourished.  HENT:  Head: Normocephalic and atraumatic.  Right Ear:  External ear normal.  Left Ear: External ear normal.  Nose: Nose normal.  Eyes: Right eye exhibits no discharge. Left eye exhibits no discharge.  Cardiovascular: Normal rate, regular rhythm and normal heart sounds.   Pulmonary/Chest: Effort normal and breath sounds normal.  Abdominal: Soft. There is no tenderness.  Musculoskeletal:  No thigh tenderness or swelling  Neurological: She is alert.  Awake, alert but disoriented. Frequently mutters, sometimes incomprehensible. No facial droop. 5/5 strength in all 4 extremities.  Normal gait  Skin: Skin is warm and dry.  Psychiatric: She is not agitated and not aggressive.  Nursing note and vitals reviewed.    ED Treatments / Results  Labs (all labs ordered are listed, but only abnormal results are displayed) Labs Reviewed  ACETAMINOPHEN LEVEL - Abnormal; Notable for the following:       Result Value   Acetaminophen (Tylenol), Serum <10 (*)    All other components within normal limits  COMPREHENSIVE METABOLIC PANEL - Abnormal; Notable for the following:    Glucose, Bld 104 (*)    All other components within normal limits  ETHANOL  SALICYLATE LEVEL  CBC WITH DIFFERENTIAL/PLATELET  RAPID URINE DRUG SCREEN, HOSP PERFORMED  URINALYSIS, ROUTINE W REFLEX MICROSCOPIC    EKG  EKG Interpretation None       Radiology No results found.  Procedures Procedures (including critical care time)  Medications Ordered in ED Medications  benztropine (COGENTIN) tablet 0.5 mg (not administered)  diltiazem (DILACOR XR) 24 hr capsule 120 mg (not administered)  divalproex (DEPAKOTE) DR tablet 750 mg (not administered)  ibuprofen (ADVIL,MOTRIN) tablet 200-400 mg (not administered)  LORazepam (ATIVAN) tablet 1 mg (not administered)  pantoprazole (PROTONIX) EC tablet 20 mg (not administered)  zolpidem (AMBIEN) tablet 10 mg (not administered)  haloperidol (HALDOL) tablet 10 mg (not administered)  acetaminophen (TYLENOL) tablet 650 mg (not  administered)  ondansetron (ZOFRAN) tablet 4 mg (not administered)  alum & mag hydroxide-simeth (MAALOX/MYLANTA) 200-200-20 MG/5ML suspension 30 mL (not administered)     Initial Impression / Assessment and Plan / ED Course  I have reviewed the triage vital signs and the nursing notes.  Pertinent labs & imaging results that were available during my care of the patient were reviewed by me and considered in my medical decision making (see chart for details).     Patient is calm and in no acute distress here. However given her erratic and aggressive behavior that the group home is reporting, she will need psychiatric consult and management. She appears medically stable for psychiatric treatment and disposition. Her home medicines have been ordered.  Final Clinical Impressions(s) / ED Diagnoses   Final diagnoses:  Bipolar affective disorder, remission status unspecified ([place])  New Prescriptions New Prescriptions   No medications on file     Sherwood Gambler, MD 03/31/17 [phone]

## 2017-03-31 NOTE — BH Assessment (Addendum)
Tele Assessment Note   Patient Name: Amandine Covino MRN: [id number] Referring Physician: Sherwood Gambler, MD Location of Patient: [facility] ED Location of Provider: [place] is an 51 y.o. female who presents unaccompanied to [facility] ED via EMS from [facility] group home. Pt has a history of bipolar disorder and was treated at [facility] ED 03/27/17-03/28/17 due to dizziness and decreased function following a change in Pt's Haldol prescription on 03/20/17. Pt returned to [facility] today because group home staff reported she has not slept and been more aggressive since discharge.  Tried to push staff member out of chair this morning. Group home staff reported to ED staff that Pt has not been acting normally for the past week. They reported she has been wandering around, taking her clothes off, being aggressive to staff, and talking about killing herself.    Pt is disorganized and unable to answer questions appropriately. When asked to give her name Pt attempted to spell it but was incorrect. Pt cannot say where she is or why she is here. Her speech is mostly incoherent. When asked if she could say where she was Pt replied, "I want a pair of red glasses." Pt has poor eye contact and is restless. Pt was difficult for ED staff to redirect. Pt appears confused and disorganized. She appeared pleasant at times but preoccupied.   Pt's guardian is listed as [name] [phone].   Diagnosis: Bipolar I Disorder, Current Episode Manic, Severe With Psychotic Features  Past Medical History:  Past Medical History:  Diagnosis Date  . Bipolar disorder ([place])   . GERD (gastroesophageal reflux disease)   . Hepatitis C virus infection without hepatic coma     Past Surgical History:  Procedure Laterality Date  . abdominal cyst removed    . CESAREAN SECTION    . COLON SURGERY      Family History: History reviewed. No pertinent family  history.  Social History:  reports that she has been smoking.  She has never used smokeless tobacco. She reports that she does not drink alcohol or use drugs.  Additional Social History:  Alcohol / Drug Use Pain Medications: See MAR Prescriptions: See MAR Over the Counter: See MAR History of alcohol / drug use?: No history of alcohol / drug abuse Longest period of sobriety (when/how long): NA  CIWA: CIWA-Ar BP: (!) 142/90 Pulse Rate: (!) 113 COWS:    PATIENT STRENGTHS: (choose at least two) Average or above average intelligence Financial means  Allergies:  Allergies  Allergen Reactions  . Trazodone And Nefazodone     Pt stated doesn't make her feel good    Home Medications:  (Not in a hospital admission)  OB/GYN Status:  No LMP recorded. Patient is postmenopausal.  General Assessment Data Location of Assessment: [facility] TTS Assessment: In system Is this a Tele or Face-to-Face Assessment?: Tele Assessment Is this an Initial Assessment or a Re-assessment for this encounter?: Initial Assessment Marital status: Single Maiden name: unknown Is patient pregnant?: No Pregnancy Status: No Living Arrangements: Group Home ([facility] group [facility]) Can pt return to current living arrangement?: Yes Admission Status: Voluntary Is patient capable of signing voluntary admission?: No Referral Source: Other (Group home) Insurance type: Medicaid     Crisis Care Plan Living Arrangements: Group Home ([facility] group [facility]) Legal Guardian: Other: Ludger Nutting [phone]) Name of Psychiatrist: Unknown Name of Therapist: Unknown  Education Status Is patient currently in school?: No Current Grade:  NA Highest grade of school patient has completed: NA Name of school: NA Contact person: NA  Risk to self with the past 6 months Suicidal Ideation: No (Unable to assess due to active psychosis) Has patient been a risk to self within the past 6 months prior to  admission? : Other (comment) (Unable to assess due to active psychosis) Suicidal Intent:  (Unable to assess due to active psychosis) Has patient had any suicidal intent within the past 6 months prior to admission? : Other (comment) (Unable to assess due to active psychosis) Is patient at risk for suicide?: No Suicidal Plan?: No Has patient had any suicidal plan within the past 6 months prior to admission? : Other (comment) (Unable to assess due to active psychosis) Access to Means: No What has been your use of drugs/alcohol within the last 12 months?: None known Previous Attempts/Gestures:  (Unable to assess due to active psychosis) How many times?: 0 (Unable to assess due to active psychosis) Other Self Harm Risks: Unable to assess due to active psychosis Triggers for Past Attempts: Other (Comment) (Unable to assess due to active psychosis) Intentional Self Injurious Behavior: None Family Suicide History: Unknown, Unable to assess Recent stressful life event(s): Other (Comment) (Unable to assess due to active psychosis) Persecutory voices/beliefs?:  (Unable to assess due to active psychosis) Depression:  (Unable to assess due to active psychosis) Depression Symptoms:  (Unable to assess due to active psychosis) Substance abuse history and/or treatment for substance abuse?: No Suicide prevention information given to non-admitted patients: Not applicable  Risk to Others within the past 6 months Homicidal Ideation: No Does patient have any lifetime risk of violence toward others beyond the six months prior to admission? : No Thoughts of Harm to Others: No Current Homicidal Intent: No Current Homicidal Plan: No Access to Homicidal Means: No Identified Victim: none History of harm to others?: No Assessment of Violence: None Noted Violent Behavior Description: No known history of violence Does patient have access to weapons?: No Criminal Charges Pending?: No Does patient have a court  date: No Is patient on probation?: No  Psychosis Hallucinations: Auditory (Pt appears to be responding to internal stimuli) Delusions: Unspecified  Mental Status Report Appearance/Hygiene: Disheveled Eye Contact: Poor Motor Activity: Restlessness Speech: Incoherent Level of Consciousness: Unable to assess Mood: Pleasant Affect: Preoccupied Anxiety Level: None Thought Processes: Irrelevant Judgement: Impaired Orientation: Person Obsessive Compulsive Thoughts/Behaviors: Unable to Assess  Cognitive Functioning Concentration: Poor Memory: Unable to Assess IQ: Average Insight: Poor Impulse Control: Poor Appetite:  (Unable to assess due to active psychosis) Weight Loss:  (Unable to assess due to active psychosis) Weight Gain:  (Unable to assess due to active psychosis) Sleep: Unable to Assess Vegetative Symptoms: Decreased grooming  ADLScreening [facility] Assessment Services) Patient's cognitive ability adequate to safely complete daily activities?: No Patient able to express need for assistance with ADLs?: Yes Independently performs ADLs?: Yes (appropriate for developmental age)  Prior Inpatient Therapy Prior Inpatient Therapy: Yes Prior Therapy Dates: unknown Prior Therapy Facilty/Provider(s): unknown Reason for Treatment: Bipolar disorder  Prior Outpatient Therapy Prior Outpatient Therapy: Yes Prior Therapy Dates: Current Prior Therapy Facilty/Provider(s): unknown Reason for Treatment: bipolar disorder Does patient have an ACCT team?: No Does patient have Intensive In-House Services?  : No Does patient have [facility] services? : No Does patient have P4CC services?: No  ADL Screening (condition at time of admission) Patient's cognitive ability adequate to safely complete daily activities?: No Is the patient deaf or have difficulty hearing?: No Does the  patient have difficulty seeing, even when wearing glasses/contacts?: No Does the patient have difficulty concentrating,  remembering, or making decisions?: Yes Patient able to express need for assistance with ADLs?: Yes Does the patient have difficulty dressing or bathing?: No Independently performs ADLs?: Yes (appropriate for developmental age) Feeding: Independent Does the patient have difficulty walking or climbing stairs?: No Weakness of Legs: None Weakness of Arms/Hands: None       Abuse/Neglect Assessment (Assessment to be complete while patient is alone) Physical Abuse:  (Unable to assess due to psychosis.) Verbal Abuse:  (Unable to assess due to psychosis.) Sexual Abuse:  (Unable to assess due to psychosis.) Exploitation of patient/patient's resources:  (Unable to assess due to psychosis.) Self-Neglect:  (Unable to assess due to psychosis.)     Advance Directives (For Healthcare) Does Patient Have a Medical Advance Directive?: No Would patient like information on creating a medical advance directive?: No - Patient declined    Additional Information 1:1 In Past 12 Months?: Yes CIRT Risk: No Elopement Risk: Yes Does patient have medical clearance?: Yes     Disposition: Gave clinical report to Lindon Romp, NP who saId Pt meets criteria for inpatient psychiatric treatment. Inocencio Homes, [facility], confirmed adult unit is at capacity. TTS will contact facilities for placement. Notified Dr. Lajean Saver and Adonis Brook, RN of recommendation.  Disposition Initial Assessment Completed for this Encounter: Yes Disposition of Patient: Other dispositions Other disposition(s): Other (Comment)  This service was provided via telemedicine using a 2-way, interactive audio and video technology.  Names of all persons participating in this telemedicine service and their role in this encounter. Name: Silvanna Morrow Role: Patient            Orpah Greek Anson Fret, [facility], [facility], [facility] Triage Specialist [phone]  Evelena Peat 03/31/2017 7:43 PM

## 2017-04-01 LAB — RAPID URINE DRUG SCREEN, HOSP PERFORMED
AMPHETAMINES: NOT DETECTED
BARBITURATES: NOT DETECTED
BENZODIAZEPINES: POSITIVE — AB
Cocaine: NOT DETECTED
Opiates: NOT DETECTED
Tetrahydrocannabinol: NOT DETECTED

## 2017-04-01 LAB — URINALYSIS, ROUTINE W REFLEX MICROSCOPIC
BILIRUBIN URINE: NEGATIVE
GLUCOSE, UA: NEGATIVE mg/dL
HGB URINE DIPSTICK: NEGATIVE
KETONES UR: 5 mg/dL — AB
LEUKOCYTES UA: NEGATIVE
NITRITE: NEGATIVE
PROTEIN: 100 mg/dL — AB
Specific Gravity, Urine: 1.03 (ref 1.005–1.030)
pH: 5 (ref 5.0–8.0)

## 2017-04-01 MED ORDER — PANTOPRAZOLE SODIUM 40 MG PO TBEC
40.0000 mg | DELAYED_RELEASE_TABLET | Freq: Every day | ORAL | Status: DC
  Administered 2017-04-01 – 2017-04-03 (×3): 40 mg via ORAL
  Filled 2017-04-01 (×3): qty 1

## 2017-04-01 NOTE — ED Notes (Signed)
Per [facility], patient needs EKG is requested by facility looking at patient for acceptance. EKG obtained.

## 2017-04-01 NOTE — Progress Notes (Signed)
Patient meets criteria for inpatient treatment. CSW faxed referrals to the following inpatient facilities for review:  [facility], [facility], [facility], [facility]    TTS will continue to seek bed placement.   Radonna Ricker MSW, [place] Disposition [phone]

## 2017-04-01 NOTE — ED Notes (Signed)
Pt remains resting, no distress noted, sitter remains at bedside,

## 2017-04-01 NOTE — ED Notes (Signed)
Pt resting will arouse, sitter remains at bedside, no distress noted

## 2017-04-01 NOTE — ED Notes (Signed)
EKG given to Dr. Mesner. 

## 2017-04-02 NOTE — Progress Notes (Addendum)
CSW received call back from [facility].  They are denying acceptance due to acuity.  Their MD does not think she would be able to participate in treatment due to confusion.   CSW spoke with [facility] ED Nurse, Theadora Rama D., RN and asked if patient appeared to be less confused and or communicative.  Per nurse, pt continues to be restless and confused but not aggressive.  When asked she does not know why she is in the hospital.    Pt declined at [facility] as pt is confused and their MD felt she would likely not be able to participate in treatment.  Patient was last assessed on 03/31/17.  Disposition CSW is requesting that patient be assessed by [facility] on 04/03/17 for updated disposition and treatment recommendation.Pt currently resides in a group home and has care from a psychiatrist.    Romie Minus T. Judi Cong, MSW, [place] Disposition Clinical Social Work [phone] (cell) [phone] (office)

## 2017-04-02 NOTE — ED Notes (Signed)
Pt very restless, up and down from bed.  Pt currently playing with sheet from bed. Pt cooperative in taking her meds.

## 2017-04-02 NOTE — ED Notes (Signed)
Pt continues to be restless in room, activity blanket ordered through house supervisor

## 2017-04-02 NOTE — ED Notes (Signed)
Per sitter pt up in room and restless, no change since ativan given per sitter.

## 2017-04-03 ENCOUNTER — Inpatient Hospital Stay (HOSPITAL_COMMUNITY)
Admission: AD | Admit: 2017-04-03 | Discharge: 2017-04-26 | DRG: 885 | Disposition: A | Discharge status: Home / Self Care | Payer: Medicaid Other | Attending: Psychiatry | Admitting: Psychiatry

## 2017-04-03 ENCOUNTER — Encounter (HOSPITAL_COMMUNITY): Payer: Self-pay | Admitting: Registered Nurse

## 2017-04-03 ENCOUNTER — Encounter (HOSPITAL_COMMUNITY): Payer: Self-pay

## 2017-04-03 DIAGNOSIS — R270 Ataxia, unspecified: Secondary | ICD-10-CM | POA: Diagnosis present

## 2017-04-03 DIAGNOSIS — F29 Unspecified psychosis not due to a substance or known physiological condition: Secondary | ICD-10-CM | POA: Diagnosis not present

## 2017-04-03 DIAGNOSIS — F312 Bipolar disorder, current episode manic severe with psychotic features: Principal | ICD-10-CM | POA: Diagnosis present

## 2017-04-03 DIAGNOSIS — F419 Anxiety disorder, unspecified: Secondary | ICD-10-CM | POA: Diagnosis not present

## 2017-04-03 DIAGNOSIS — G4700 Insomnia, unspecified: Secondary | ICD-10-CM | POA: Diagnosis present

## 2017-04-03 DIAGNOSIS — F319 Bipolar disorder, unspecified: Secondary | ICD-10-CM | POA: Diagnosis not present

## 2017-04-03 DIAGNOSIS — F209 Schizophrenia, unspecified: Secondary | ICD-10-CM | POA: Diagnosis not present

## 2017-04-03 DIAGNOSIS — F17210 Nicotine dependence, cigarettes, uncomplicated: Secondary | ICD-10-CM | POA: Diagnosis present

## 2017-04-03 DIAGNOSIS — K219 Gastro-esophageal reflux disease without esophagitis: Secondary | ICD-10-CM | POA: Diagnosis present

## 2017-04-03 DIAGNOSIS — F0391 Unspecified dementia with behavioral disturbance: Secondary | ICD-10-CM | POA: Diagnosis not present

## 2017-04-03 DIAGNOSIS — B1920 Unspecified viral hepatitis C without hepatic coma: Secondary | ICD-10-CM | POA: Diagnosis present

## 2017-04-03 DIAGNOSIS — R443 Hallucinations, unspecified: Secondary | ICD-10-CM | POA: Diagnosis not present

## 2017-04-03 DIAGNOSIS — R450 Nervousness: Secondary | ICD-10-CM | POA: Diagnosis not present

## 2017-04-03 DIAGNOSIS — F39 Unspecified mood [affective] disorder: Secondary | ICD-10-CM | POA: Diagnosis not present

## 2017-04-03 LAB — AMMONIA: AMMONIA: 34 umol/L (ref 9–35)

## 2017-04-03 LAB — VALPROIC ACID LEVEL: VALPROIC ACID LVL: 91 ug/mL (ref 50.0–100.0)

## 2017-04-03 MED ORDER — LORAZEPAM 1 MG PO TABS
ORAL_TABLET | ORAL | Status: AC
  Administered 2017-04-03: 1 mg via ORAL
  Filled 2017-04-03: qty 1

## 2017-04-03 MED ORDER — LORAZEPAM 1 MG PO TABS
1.0000 mg | ORAL_TABLET | Freq: Four times a day (QID) | ORAL | Status: DC | PRN
  Administered 2017-04-03: 1 mg via ORAL

## 2017-04-03 MED ORDER — BENZTROPINE MESYLATE 0.5 MG PO TABS
0.5000 mg | ORAL_TABLET | Freq: Two times a day (BID) | ORAL | Status: DC
  Administered 2017-04-04 – 2017-04-26 (×45): 0.5 mg via ORAL
  Filled 2017-04-03 (×52): qty 1

## 2017-04-03 MED ORDER — IBUPROFEN 400 MG PO TABS
ORAL_TABLET | ORAL | Status: AC
  Administered 2017-04-03: 400 mg via ORAL
  Filled 2017-04-03: qty 1

## 2017-04-03 MED ORDER — DILTIAZEM HCL ER 120 MG PO CP24
120.0000 mg | ORAL_CAPSULE | Freq: Every day | ORAL | Status: DC
  Administered 2017-04-04 – 2017-04-26 (×22): 120 mg via ORAL
  Filled 2017-04-03 (×25): qty 1

## 2017-04-03 MED ORDER — IBUPROFEN 200 MG PO TABS
200.0000 mg | ORAL_TABLET | ORAL | Status: DC | PRN
  Administered 2017-04-03 – 2017-04-25 (×8): 400 mg via ORAL
  Filled 2017-04-03 (×5): qty 2
  Filled 2017-04-03: qty 1
  Filled 2017-04-03 (×2): qty 2

## 2017-04-03 MED ORDER — HALOPERIDOL 5 MG PO TABS
5.0000 mg | ORAL_TABLET | Freq: Three times a day (TID) | ORAL | Status: DC
  Administered 2017-04-04 – 2017-04-06 (×8): 5 mg via ORAL
  Filled 2017-04-03 (×14): qty 1

## 2017-04-03 MED ORDER — LORAZEPAM 0.5 MG PO TABS
0.5000 mg | ORAL_TABLET | Freq: Four times a day (QID) | ORAL | Status: DC | PRN
  Administered 2017-04-04 – 2017-04-05 (×2): 0.5 mg via ORAL
  Filled 2017-04-03 (×4): qty 1

## 2017-04-03 MED ORDER — ZOLPIDEM TARTRATE 5 MG PO TABS: 5.0000 mg | ORAL_TABLET | Freq: Every day | ORAL | Status: DC

## 2017-04-03 MED ORDER — ZOLPIDEM TARTRATE 10 MG PO TABS
ORAL_TABLET | ORAL | Status: AC
  Filled 2017-04-03: qty 1

## 2017-04-03 MED ORDER — ONDANSETRON HCL 4 MG PO TABS
4.0000 mg | ORAL_TABLET | Freq: Three times a day (TID) | ORAL | Status: DC | PRN
  Administered 2017-04-24: 4 mg via ORAL
  Filled 2017-04-03: qty 1

## 2017-04-03 MED ORDER — PANTOPRAZOLE SODIUM 40 MG PO TBEC
40.0000 mg | DELAYED_RELEASE_TABLET | Freq: Every day | ORAL | Status: DC
  Administered 2017-04-04 – 2017-04-26 (×23): 40 mg via ORAL
  Filled 2017-04-03 (×25): qty 1

## 2017-04-03 MED ORDER — NICOTINE 7 MG/24HR TD PT24
7.0000 mg | MEDICATED_PATCH | Freq: Every day | TRANSDERMAL | Status: DC
  Administered 2017-04-04 – 2017-04-25 (×13): 7 mg via TRANSDERMAL
  Filled 2017-04-03 (×26): qty 1

## 2017-04-03 MED ORDER — ZOLPIDEM TARTRATE 5 MG PO TABS
10.0000 mg | ORAL_TABLET | Freq: Once | ORAL | Status: AC
  Administered 2017-04-03: 10 mg via ORAL

## 2017-04-03 MED ORDER — QUETIAPINE FUMARATE 50 MG PO TABS
50.0000 mg | ORAL_TABLET | Freq: Every day | ORAL | Status: DC
  Filled 2017-04-03 (×4): qty 1

## 2017-04-03 MED ORDER — MAGNESIUM HYDROXIDE 400 MG/5ML PO SUSP: 30.0000 mL | Freq: Every day | ORAL | Status: DC | PRN

## 2017-04-03 MED ORDER — DIVALPROEX SODIUM 500 MG PO DR TAB
750.0000 mg | DELAYED_RELEASE_TABLET | Freq: Every day | ORAL | Status: DC
  Administered 2017-04-04 – 2017-04-18 (×14): 750 mg via ORAL
  Filled 2017-04-03 (×20): qty 1

## 2017-04-03 MED ORDER — HALOPERIDOL 5 MG PO TABS
10.0000 mg | ORAL_TABLET | Freq: Three times a day (TID) | ORAL | Status: DC
  Filled 2017-04-03: qty 2

## 2017-04-03 NOTE — Consult Note (Signed)
  Chart and medications reviewed.  Notes indicated that patient continues to be restless and confused.  Medications indicated that she is currently taking:  Cogentin 0.5 mg bid Depakote XR 750 mg Q hs Haldol 10 mg Tid Ativan 1 mg Q 6 hr prn Ambien 10 mg Q hs No Depakote level taken.  Last recorded level 10/02/2012.  Will order level prior to any medication changes.  Patient recently denied at [facility] related to confusion.    Shuvon B. Rankin FNP-BC

## 2017-04-03 NOTE — ED Notes (Signed)
IVC papers faxed to Magistrate and called earlier. 

## 2017-04-03 NOTE — Tx Team (Signed)
Initial Treatment Plan 04/03/2017 11:06 PM [name] Ball FVW:[id number]    PATIENT STRESSORS: Medication change or noncompliance   PATIENT STRENGTHS: Ability for insight Communication skills   PATIENT IDENTIFIED PROBLEMS: Bipolar, schizoaffective  "My back hurts"  "My mom died"  Medication changes  Hallucinations  Suicide Risk           DISCHARGE CRITERIA:  Adequate post-discharge living arrangements Improved stabilization in mood, thinking, and/or behavior Need for constant or close observation no longer present  PRELIMINARY DISCHARGE PLAN: Attend aftercare/continuing care group Return to previous living arrangement  PATIENT/FAMILY INVOLVEMENT: This treatment plan has been presented to and reviewed with the patient, [name] Ball .The patient and family have been given the opportunity to ask questions and make suggestions.  Elenore Rota, RN 04/03/2017, 11:06 PM

## 2017-04-03 NOTE — ED Provider Notes (Signed)
Pt stable She is awaiting placement No acute issues at this time    Ripley Fraise, MD 04/03/17 [phone]

## 2017-04-03 NOTE — ED Notes (Signed)
Per [facility], pt needs to be IVC'd because she is psychotic and fax ivc paperwork to [phone].  Reports that pt should be able to come to [facility] this afternoon.

## 2017-04-03 NOTE — Progress Notes (Signed)
Patient ID: [name] Ball, female   DOB: 1966-06-10, 51 y.o.   MRN: [id number]  Pt arrives from [facility] ED after being IVC'ed due to confusion, aggressive behaviors, hallucinations and suicidal ideation. Pt behavior is restless, mood is imitable. In paper scrubs. Speech is soft and slow, incoherent at times. Pt forwards little to writer during interaction, electively mute for most of our interaction. Reports back pain and denies any thoughts of hurting herself or others. Also states "my mother died recently" with a sad facial expression. Pt presents as confused and delusional. Reports to Probation officer that her husband and sister are at [facility] while trying to open the main door and other patients doors on the unit. Pt seen pressing random buttons in the hallway. Pt says "you have to let me leave." Refuses to let staff take her vitals and place armband on her wrist.   Admission completed with information from reporting nurse, chart review and interaction with patient. Pt refused to sign consent forms and allow staff to do a skin search. Belongings search completed by Probation officer, see belongings sheet. AC and charge RN notified of patients behaviors. Other staff member was able to approach pt and she agreed to take some of her medications. See MAR. Report given to accepting RN. Pt currently lying in bed. Will continue to monitor for 1:1 need.

## 2017-04-03 NOTE — Progress Notes (Signed)
Psychoeducational Group Note  Date:  04/03/2017 Time:  2210 Group Topic/Focus:  Wrap-Up Group:   The focus of this group is to help patients review their daily goal of treatment and discuss progress on daily workbooks.  Participation Level: Did Not Attend  Participation Quality:  Not Applicable  Affect:  Not Applicable  Cognitive:  Not Applicable  Insight:  Not Applicable  Engagement in Group: Not Applicable  Additional Comments:  The patient did not attend group since she was too agitated.   Yaron Grasse S 04/03/2017, 10:10 PM

## 2017-04-03 NOTE — Progress Notes (Signed)
Report received from admitting RN.  Pt is resting in bed with eyes closed.  Respirations are even and unlabored.  Will continue to monitor and assess.

## 2017-04-03 NOTE — Social Work (Signed)
Call from Symphanie ([place] guardian), states she has been guardian for patient for several years.  Patient lives at [facility] and can return at [place]; is on ACT team w [facility].  Requests call from [place] to give further information.  Edwyna Shell, LCSW Lead Clinical Social Worker Phone:  [phone]

## 2017-04-03 NOTE — Progress Notes (Addendum)
Per Leonia Reader , [facility], patient has been accepted to [facility], bed 506-1 ; Accepting provider is Dr. Dwyane Dee; Attending provider is Dr. Parke Poisson.  Patient can arrive once [facility] has received the patient's IVC paperwork and once the bed is available. [facility] anticipating discharges.  Number for report is [phone].   Leonia Reader, notified the patient's nurse. AC will call when bed is available and patient can transport.    Radonna Ricker MSW, [place] Disposition [phone]  Per Leonia Reader, AC< RN, Patient may arrive at 8:30 PM.  [facility] still needs IVC paperwork prior to admission.  Areatha Keas. Judi Cong, MSW, [place] Disposition Clinical Social Work [phone] (cell) [phone] (office)

## 2017-04-04 DIAGNOSIS — F17210 Nicotine dependence, cigarettes, uncomplicated: Secondary | ICD-10-CM

## 2017-04-04 DIAGNOSIS — F312 Bipolar disorder, current episode manic severe with psychotic features: Principal | ICD-10-CM

## 2017-04-04 DIAGNOSIS — K219 Gastro-esophageal reflux disease without esophagitis: Secondary | ICD-10-CM

## 2017-04-04 DIAGNOSIS — B1920 Unspecified viral hepatitis C without hepatic coma: Secondary | ICD-10-CM

## 2017-04-04 MED ORDER — ZOLPIDEM TARTRATE 5 MG PO TABS
5.0000 mg | ORAL_TABLET | Freq: Every evening | ORAL | Status: DC | PRN
  Administered 2017-04-04 – 2017-04-25 (×21): 5 mg via ORAL
  Filled 2017-04-04 (×22): qty 1

## 2017-04-04 MED ORDER — LORAZEPAM 1 MG PO TABS
1.0000 mg | ORAL_TABLET | Freq: Once | ORAL | Status: AC
  Administered 2017-04-04: 1 mg via ORAL
  Filled 2017-04-04: qty 1

## 2017-04-04 NOTE — Progress Notes (Signed)
Pt came out of her room and began slowly pacing the hallway, talking softly, incomprehensible for the most part.  PRN medication for anxiety was offered and pt took the medication in her hand and dropped it on the ground.  Will continue to monitor and assess.

## 2017-04-04 NOTE — BHH Suicide Risk Assessment (Signed)
[place] INPATIENT:  Family/Significant Other Suicide Prevention Education  Suicide Prevention Education:  Education Completed; Ludger Nutting, [phone] (Legal Guardian) has been identified by the patient as the family member/significant other with whom the patient will be residing, and identified as the person(s) who will aid the patient in the event of a mental health crisis (suicidal ideations/suicide attempt).  With written consent from the patient, the family member/significant other has been provided the following suicide prevention education, prior to the and/or following the discharge of the patient.  The suicide prevention education provided includes the following:  Suicide risk factors  Suicide prevention and interventions  National Suicide Hotline telephone number  [facility] assessment telephone number  [facility] Emergency Assistance [place] and/or Residential Mobile Crisis Unit telephone number  Request made of family/significant other to:  Remove weapons (e.g., guns, rifles, knives), all items previously/currently identified as safety concern.    Remove drugs/medications (over-the-counter, prescriptions, illicit drugs), all items previously/currently identified as a safety concern.  The family member/significant other verbalizes understanding of the suicide prevention education information provided.  The family member/significant other agrees to remove the items of safety concern listed above.  Frutoso Chase Timothea Bodenheimer 04/04/2017, 11:45 AM

## 2017-04-04 NOTE — Tx Team (Signed)
Interdisciplinary Treatment and Diagnostic Plan Update  04/04/2017 Time of Session: 10:52 AM  [name] Ball MRN: [id number]  Principal Diagnosis: Bipolar affective disorder, current episode, mania with psychotic features. Secondary Diagnoses: Active Problems:   Bipolar disorder, current episode manic, severe with psychotic features ([place])   Current Medications:  Current Facility-Administered Medications  Medication Dose Route Frequency Provider Last Rate Last Dose  . benztropine (COGENTIN) tablet 0.5 mg  0.5 mg Oral BID Rankin, Shuvon B, NP   0.5 mg at 04/04/17 0818  . diltiazem (DILACOR XR) 24 hr capsule 120 mg  120 mg Oral Daily Rankin, Shuvon B, NP   120 mg at 04/04/17 0817  . divalproex (DEPAKOTE) DR tablet 750 mg  750 mg Oral QHS Rankin, Shuvon B, NP      . haloperidol (HALDOL) tablet 5 mg  5 mg Oral TID Patriciaann Clan E, PA-C   5 mg at 04/04/17 0818  . ibuprofen (ADVIL,MOTRIN) tablet 200-400 mg  200-400 mg Oral Q4H PRN Rankin, Shuvon B, NP   400 mg at 04/03/17 2039  . LORazepam (ATIVAN) tablet 0.5 mg  0.5 mg Oral Q6H PRN Patriciaann Clan E, PA-C      . magnesium hydroxide (MILK OF MAGNESIA) suspension 30 mL  30 mL Oral Daily PRN Rankin, Shuvon B, NP      . nicotine (NICODERM CQ - dosed in mg/24 hr) patch 7 mg  7 mg Transdermal Daily Simon, Spencer E, PA-C      . ondansetron (ZOFRAN) tablet 4 mg  4 mg Oral Q8H PRN Rankin, Shuvon B, NP      . pantoprazole (PROTONIX) EC tablet 40 mg  40 mg Oral Daily Rankin, Shuvon B, NP   40 mg at 04/04/17 0818  . QUEtiapine (SEROQUEL) tablet 50 mg  50 mg Oral QHS Patriciaann Clan E, PA-C        PTA Medications: Prescriptions Prior to Admission  Medication Sig Dispense Refill Last Dose  . benztropine (COGENTIN) 0.5 MG tablet Take 0.5 mg by mouth 2 (two) times daily.   unknown  . diltiazem (DILACOR XR) 120 MG 24 hr capsule Take 120 mg by mouth daily.   unknown  . divalproex (DEPAKOTE) 250 MG DR tablet Take 750 mg by mouth at bedtime.    unknown  .  haloperidol (HALDOL) 10 MG tablet Take 10 mg by mouth 3 (three) times daily.   unknown  . haloperidol decanoate (HALDOL DECANOATE) 100 MG/ML injection Inject 100 mg into the muscle every 28 (twenty-eight) days.   unknown  . ibuprofen (ADVIL,MOTRIN) 200 MG tablet Take 200-400 mg by mouth every 4 (four) hours as needed for moderate pain.    unknown  . LORazepam (ATIVAN) 1 MG tablet Take 1 mg by mouth every 6 (six) hours as needed for anxiety.   unknown  . pantoprazole (PROTONIX) 20 MG tablet Take 20 mg by mouth daily.   unknown  . QUEtiapine (SEROQUEL) 50 MG tablet Take 50 mg by mouth at bedtime.   unknown  . traZODone (DESYREL) 100 MG tablet Take 100 mg by mouth at bedtime as needed for sleep.   unknown  . zolpidem (AMBIEN) 10 MG tablet Take 10 mg by mouth at bedtime.   unknown    Treatment Modalities: Medication Management, Group therapy, Case management,  1 to 1 session with clinician, Psychoeducation, Recreational therapy.  Patient Stressors: Medication change or noncompliance  Patient Strengths: Ability for insight Communication skills   Physician Treatment Plan for Primary Diagnosis: Bipolar affective disorder, current episode,  mania with psychotic features. Long Term Goal(s): Improvement in symptoms so as ready for discharge  Short Term Goals: Ability to identify changes in lifestyle to reduce recurrence of condition will improve Ability to verbalize feelings will improve Ability to demonstrate self-control will improve  Medication Management: Evaluate patient's response, side effects, and tolerance of medication regimen.  Therapeutic Interventions: 1 to 1 sessions, Unit Group sessions and Medication administration.  Evaluation of Outcomes: Progressing  Physician Treatment Plan for Secondary Diagnosis: Active Problems:   Bipolar disorder, current episode manic, severe with psychotic features ([place])  Long Term Goal(s): Improvement in symptoms so as ready for discharge  Short  Term Goals: Ability to identify changes in lifestyle to reduce recurrence of condition will improve Ability to verbalize feelings will improve Ability to demonstrate self-control will improve  Medication Management: Evaluate patient's response, side effects, and tolerance of medication regimen.  Therapeutic Interventions: 1 to 1 sessions, Unit Group sessions and Medication administration.  Evaluation of Outcomes: Progressing   RN Treatment Plan for Primary Diagnosis: Bipolar affective disorder, current episode, mania with psychotic features. Long Term Goal(s): Knowledge of disease and therapeutic regimen to maintain health will improve  Short Term Goals: Ability to remain free from injury will improve, Ability to disclose and discuss suicidal ideas, Ability to identify and develop effective coping behaviors will improve and Compliance with prescribed medications will improve  Medication Management: RN will administer medications as ordered by provider, will assess and evaluate patient's response and provide education to patient for prescribed medication. RN will report any adverse and/or side effects to prescribing provider.  Therapeutic Interventions: 1 on 1 counseling sessions, Psychoeducation, Medication administration, Evaluate responses to treatment, Monitor vital signs and CBGs as ordered, Perform/monitor CIWA, COWS, AIMS and Fall Risk screenings as ordered, Perform wound care treatments as ordered.  Evaluation of Outcomes: Progressing   LCSW Treatment Plan for Primary Diagnosis: Bipolar affective disorder, current episode, mania with psychotic features. Long Term Goal(s): Safe transition to appropriate next level of care at discharge, Engage patient in therapeutic group addressing interpersonal concerns.  Short Term Goals: Engage patient in aftercare planning with referrals and resources, Facilitate acceptance of mental health diagnosis and concerns, Identify triggers associated with  mental health/substance abuse issues and Increase skills for wellness and recovery  Therapeutic Interventions: Assess for all discharge needs, 1 to 1 time with Social worker, Explore available resources and support systems, Assess for adequacy in community support network, Educate family and significant other(s) on suicide prevention, Complete Psychosocial Assessment, Interpersonal group therapy.  Evaluation of Outcomes: Progressing   Progress in Treatment: Attending groups: No Participating in groups: No Taking medication as prescribed: Yes Toleration of medication: Yes, no side effects reported at this time Family/Significant other contact made: Ludger Nutting [phone], guardian  Patient understands diagnosis: No, limited insight  Discussing patient identified problems/goals with staff: Yes Medical problems stabilized or resolved: Yes Denies suicidal/homicidal ideation: Yes Issues/concerns per patient self-inventory: None Other: N/A  New problem(s) identified: None identified at this time.   New Short Term/Long Term Goal(s):  Guardian, Symphanie Uvaldo Rising, wants Kim to receive help with "Medication stabilization".   Discharge Plan or Barriers: Pt will return to [facility]   Reason for Continuation of Hospitalization: Delusions  Hallucinations Medication stabilization Suicidal ideation   Estimated Length of Stay: 04/09/17  Attendees: Patient: [name] Ball  04/04/2017  10:52 AM  Physician: Neita Garnet, MD 04/04/2017  10:52 AM  Nursing: Jonette Mate, RN 04/04/2017  10:52 AM  RN Care  Manager: Lars Pinks, RN 04/04/2017  10:52 AM  Social Worker: Ripley Fraise, LCSW; Verdis Frederickson, Social Work Intern 04/04/2017  10:52 AM  Recreational Therapist: Victorino Sparrow, LRT 04/04/2017  10:52 AM  Other: Norberto Sorenson, [place] 04/04/2017  10:52 AM  Other:  04/04/2017  10:52 AM  Other: 04/04/2017  10:52 AM    Scribe for Treatment Team: Darleen Crocker,  Student-Social Work 04/04/2017 10:52 AM

## 2017-04-04 NOTE — Progress Notes (Signed)
Adult Psychoeducational Group Note  Date:  04/04/2017 Time:  10:53 PM  Group Topic/Focus:  Wrap-Up Group:   The focus of this group is to help patients review their daily goal of treatment and discuss progress on daily workbooks.  Participation Level:  Active  Participation Quality:  Appropriate  Affect:  Appropriate  Cognitive:  Appropriate  Insight: Appropriate  Engagement in Group:  Engaged  Modes of Intervention:  Discussion  Additional Comments:  Pt stated her goal was to interact more with her peers. Pt stated that she accomplished her goal today. Pt rated her day a 6 out of 10. Pt stated the goal for tomorrow was to attend all groupsl  Candy Sledge 04/04/2017, 10:53 PM

## 2017-04-04 NOTE — BHH Counselor (Signed)
Adult Comprehensive Assessment  Patient ID: [name] Ball, female   DOB: 09-20-1965, 51 y.o.   MRN: [id number]  Information Source: Information source: Patient (Legal guardian, Ludger Nutting)  Current Stressors:  Educational / Learning stressors: N/A Employment / Job issues: Pt receives disability, is unemployed  Family Relationships: Pt has 2 sisters, mother passed away last year, no contact with father  Museum/gallery curator / Lack of resources (include bankruptcy): [place] / Lack of housing: Lives at [facility] group home  Physical health (include injuries & life threatening diseases): N/A Social relationships: Pt has limited contact with 2 sisters and 1 daughter Substance abuse: Prior history of Cocaine use before 2013  Bereavement / Loss: Mother passed away last year   Living/Environment/Situation:  Living Arrangements: Group Home How long has patient lived in current situation?: Since April 2017 What is atmosphere in current home: Chaotic  Family History:  Marital status: Single Are you sexually active?: No What is your sexual orientation?: Heterosexual  Does patient have children?: Yes How many children?: 3 How is patient's relationship with their children?: Pt only has contact with one adult daughter, has never lived with the children   Childhood History:  By whom was/is the patient raised?: Mother Description of patient's relationship with caregiver when they were a child: No information at this time Patient's description of current relationship with people who raised him/her: None, mother has passed away, has no contact with father Does patient have siblings?: Yes Number of Siblings: 2 Description of patient's current relationship with siblings: Limited contact with both sisters  Did patient suffer any verbal/emotional/physical/sexual abuse as a child?: No (No information at this time ) Did patient suffer from severe childhood neglect?: No Has  patient ever been sexually abused/assaulted/raped as an adolescent or adult?: No Was the patient ever a victim of a crime or a disaster?: No Witnessed domestic violence?: No Has patient been effected by domestic violence as an adult?: No  Education:  Highest grade of school patient has completed: No information at this time Currently a student?: No Learning disability?: No  Employment/Work Situation:   Employment situation: On disability Why is patient on disability: Mental Health Diagnosis How long has patient been on disability: Entire life Patient's job has been impacted by current illness: Yes Describe how patient's job has been impacted: Pt is unable to work due to mental health diagnosis What is the longest time patient has a held a job?: No information at this time Where was the patient employed at that time?: No information at this time  Has patient ever been in the [employer]?: No Has patient ever served in combat?: No Did You Receive Any Psychiatric Treatment/Services While in Passenger transport manager?: No Are There Guns or Other Weapons in [place]?: No Are These Weapons Safely Secured?: Yes  Financial Resources:   Financial resources: Teacher, early years/pre Does patient have a Programmer, applications or guardian?: Yes Name of representative payee or guardian: Brewing technologist   Alcohol/Substance Abuse:   What has been your use of drugs/alcohol within the last 12 months?: N/A (Prior Cocaine use before 2013 ) If attempted suicide, did drugs/alcohol play a role in this?: No Alcohol/Substance Abuse Treatment Hx: Denies past history Has alcohol/substance abuse ever caused legal problems?: No  Social Support System:   Heritage manager System: Poor Describe Community Support System: Group home residents, limited contact with siblings and adult children  Type of faith/religion: No information at this time How does patient's faith help to cope with current  illness?: No information at this  time   Leisure/Recreation:   Leisure and Hobbies: No information at this time  Strengths/Needs:   What things does the patient do well?: No information at this time In what areas does patient struggle / problems for patient: No information at this time  Discharge Plan:   Does patient have access to transportation?: Yes Will patient be returning to same living situation after discharge?: Yes Currently receiving community mental health services: Yes (From Whom) If no, would patient like referral for services when discharged?: No Does patient have financial barriers related to discharge medications?: No  Summary/Recommendations:   Summary and Recommendations (to be completed by the evaluator): Shabree is a 51 year old African-American female who has been diagnosed with BIPOLAR 1 DISORDER CURRENT EPISODE ;MANIC SEVERE WITH PSYCHOTIC FEATURES.  She presents with VH and AH.  Her legal guardian is Ludger Nutting, of [place]. DSS.  Upon discharge she will return to [facility] group home.  While in the hospital she can benefit from crisis stabilization, medication management, therapeutic milieu, and referral for services.   Darleen Crocker. 04/04/2017

## 2017-04-04 NOTE — BHH Suicide Risk Assessment (Signed)
[facility] Admission Suicide Risk Assessment   Nursing information obtained from:   patient and chart  Demographic factors:   51 year old , separated,  Current Mental Status:   see below Loss Factors:   chronic mental illness Historical Factors:   has been diagnosed with Bipolar Disorder  Risk Reduction Factors:   resilience   Total Time spent with patient: 45 minutes Principal Problem: Bipolar Disorder with Psychotic Features versus Schizoaffective Disorder Diagnosis:   Patient Active Problem List   Diagnosis Date Noted  . Bipolar disorder, current episode manic, severe with psychotic features ([place]) [F31.2] 04/03/2017  . Hepatitis C virus infection without hepatic coma [B19.20]   . Bipolar disorder ([place]) [F31.9]   . GERD (gastroesophageal reflux disease) [K21.9]     Continued Clinical Symptoms:    The "Alcohol Use Disorders Identification Test", Guidelines for Use in Primary Care, Second Edition.  World Pharmacologist [facility]). Score between 0-7:  no or low risk or alcohol related problems. Score between 8-15:  moderate risk of alcohol related problems. Score between 16-19:  high risk of alcohol related problems. Score 20 or above:  warrants further diagnostic evaluation for alcohol dependence and treatment.   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 She has a legal guardian ( [name] Ball via [place]) . She is managed by ACT team.  At this time she is  limited historian and presents thought disordered, disorganized. Presented to ED restless, confused, disorganized , internally preoccupied  Chart notes indicate patient worsened following recent changes made to her medication regimen, resulting in decreased functional level , aggression, poor sleep. Reports from ACT team to CSW is that they suspect patient may have been cheeking /non compliant with PO medications prior to admission  At this time presents alert, pleasant,  not angry or irritable,significantly  thought disordered, at times laughing or giggling inappropriately. Presents with  rambling speech at times,difficult to follow what she is saying . States, for example " moneymen, wait for a moment, I need to go get clothes, but I will come back". She states she does experience auditory hallucinations at times, but I don't know what they are about ". She reports she sometimes feels like people want to " get her" but denies any suicidal or homicidal  Ideations. Denies feeling depressed at this time.  9/30 BMP and CBC unremarkable, 10/3 Ammonia serum level 34 10/3 Valproic Acid Serum level 91. 9/26 Head MRI poor quality due to motion but no acute abnormality noted  Dx-  Schizoaffective Disorder   Plan- Inpatient Admission. Continue Haldol 5 mgrs TID. Patient is also on Haldol Decanoate IM Q month- team will work on determining when next scheduled IM Haldol Injection is scheduled for. Continue Depakote 750 mgrs QHS . Continue Cogentin 0.5 mgrs BID. Continue Ativan 0.5 mgrs Q 6 hours PRN Anxiety .     Musculoskeletal: Strength & Muscle Tone: within normal limits Gait & Station: slow Patient leans: N/A  Psychiatric Specialty Exam: Physical Exam  ROS denies headache, denies chest pain, no shortness of breath, no vomiting   Blood pressure 107/65, pulse (!) 103, temperature 98 F (36.7 C), temperature source Oral, resp. rate 18, height 5\' 6"  (1.676 m), weight 81.6 kg (179 lb 14.3 oz).Body mass index is 29.04 kg/m.  General Appearance: Fairly Groomed  Eye Contact:  Fair  Speech:  variable, rambling at times   Volume:  Normal not loud   Mood:  denies depression and does not present sad or subdued   Affect:  inappropriate affect at times, laughs for no apparent reason  Thought Process:  Disorganized and Descriptions of Associations: Loose  Orientation:  Other:  alert and attentive, but presents confused, unable to answer orientation questions , but this may be  in part due to thought disorder, does not present with fluctuating sensorium at this time  Thought Content:  denies hallucinations, does appear internally preoccupied at times   Suicidal Thoughts:  No denies suicidal or self injurious ideations, denies homicidal or violent ideations   Homicidal Thoughts:  No  Memory:  recent and remote limited  Judgement:  Impaired  Insight:  Lacking  Psychomotor Activity:  some pacing , but not overtly restless or psychomotorically agitated   Concentration:  Concentration: Poor and Attention Span: Poor  Recall:  Poor  Fund of Knowledge:  Fair  Language:  Fair  Akathisia:  Negative  Handed:  Right  AIMS (if indicated):     Assets:  Resilience Social Support  ADL's:  Impaired  Cognition:  Impaired,  Moderate  Sleep:  Number of Hours: 5.5      COGNITIVE FEATURES THAT CONTRIBUTE TO RISK:  Closed-mindedness and Loss of executive function    SUICIDE RISK:   Moderate:  Frequent suicidal ideation with limited intensity, and duration, some specificity in terms of plans, no associated intent, good self-control, limited dysphoria/symptomatology, some risk factors present, and identifiable protective factors, including available and accessible social support.  PLAN OF CARE: Patient will be admitted to inpatient psychiatric unit for stabilization and safety. Will provide and encourage milieu participation. Provide medication management and maked adjustments as needed.  Will follow daily.    I certify that inpatient services furnished can reasonably be expected to improve the patient's condition.   Jenne Campus, MD 04/04/2017, 3:08 PM

## 2017-04-04 NOTE — Progress Notes (Signed)
Recreation Therapy Notes  04/04/17  0910:  LRT met with patient to complete recreation assessment.  Pt unable to complete assessment.  Pt couldn't focus on the questions.  Pt kept talking about her sister, children and other things.  Pt wouldn't sit steal and was preoccupied with leaving.  LRT will try to assess pt again on tomorrow.   Victorino Sparrow, LRT/CTRS      Ria Comment, Jakory Matsuo A 04/04/2017 2:36 PM

## 2017-04-04 NOTE — Plan of Care (Signed)
Problem: Safety: Goal: Periods of time without injury will increase Outcome: Progressing Pt has not harmed self or others tonight.  She denies SI/HI.

## 2017-04-04 NOTE — Progress Notes (Signed)
Pt testing door to the unit and walking up to doors of other pts.  Pt placed on unit restriction.  Pt redirectable at this time.  Requires frequent redirection.  She allowed staff to obtain vital signs this morning.  Pt is currently resting in her bed.  Will continue to monitor and assess.

## 2017-04-04 NOTE — BHH Group Notes (Signed)
LCSW Group Therapy 04/04/2017 1:15pm  Type of Therapy and Topic:  Group Therapy:  Change and Accountability  Participation Level:  None  Description of Group In this group, patients discussed power and accountability for change.  The group identified the challenges related to accountability and the difficulty of accepting the outcomes of negative behaviors.  Patients were encouraged to openly discuss a challenge/change they could take responsibility for.  Patients discussed the use of "change talk" and positive thinking as ways to support achievement of personal goals.  The group discussed ways to give support and empowerment to peers.  Therapeutic Goals: 1. Patients will state the relationship between personal power and accountability in the change process 2. Patients will identify the positive and negative consequences of a personal choice they have made 3. Patients will identify one challenge/choice they will take responsibility for making 4. Patients will discuss the role of "change talk" and the impact of positive thinking as it supports successful personal change 5. Patients will verbalize support and affirmation of change efforts in peers  Summary of Patient Progress:  Anniyah attended group but left the room after a few minutes.  She returned to the room and laid down in a chair.  She was staring out the window for a short time before she left again.  She returned near the end of the group session but did not share anything with the group.  She whispered to other group members during the story.  Therapeutic Modalities Solution Focused Brief Therapy Motivational Interviewing Cognitive Behavioral Therapy  Riley Lam Work 04/04/2017 1:27 PM

## 2017-04-04 NOTE — BHH Counselor (Signed)
Spoke with [name] with [facility] ACT team, [phone].  She states they have worked with pt for 1.5 years.  Was stable when they started working with her, but within last 6 months she has become increasingly disorganized and delusional.  Although pt is on an injectable, ACT team is wondering if she may be cheeking her meds as she is on a robust regimen.  They will fax med list over.  [name] knows there is one family member who is in touch with Kady ; they take her for visits on holidays.

## 2017-04-04 NOTE — Progress Notes (Signed)
D: Patient denies SI, HI or AVH although patient intermittently reports seeing her "sister" through the window in the door. Patient has a depressed mood, flat affect and appears internally preoccupied at times.  She is awake and alert but does seem to have some mild confusion and is disorganized with thought process.  Pt. Is visible on the unit and has been attending groups although does require redirection at times without difficulty.  A: Patient given emotional support from RN. Patient encouraged to come to staff with concerns and/or questions. Patient's medication routine continued. Patient's orders and plan of care reviewed.   R: Patient remains appropriate and cooperative. Will continue to monitor patient q15 minutes for safety.

## 2017-04-04 NOTE — Progress Notes (Signed)
D: Pt was in the hallway upon initial approach.  Pt presents with anxious, preoccupied affect and mood.  She paces the hallway frequently, talking when nobody else is speaking to her.  Pt is difficult to understand at times and she has flight of ideas.  Pt asked staff "what did you do with my baby?"  Pt believed a staff member was her daughter.  She states "do me a favor and let's go outside and get some wings."  When asked how her day was, she states "it was a lot of being tedious" and then began talking off topic.  Pt denies SI/HI, reports pain from headache of 6/10.  When asked if she is having hallucinations, she states "ain't got time for hallucinations, baby."  Pt has been visible in milieu.  She is intrusive with staff and peers at times.  Requires frequent redirection to stay away from doors to the unit.  Pt attended evening group.    A: Introduced self to pt.  Actively listened to pt and offered support and encouragement. Medication administered per order.  PRN medication administered for pain and sleep.  On-call provider notified of pt's anxiety and it was not yet time for another PRN medication for anxiety.  Ativan 1 mg POX1 was ordered and administered.  Q15 minute safety checks maintained.  R: Pt is safe on the unit.  Pt is compliant with medications.  Will continue to monitor and assess.

## 2017-04-04 NOTE — H&P (Signed)
Psychiatric Admission Assessment Adult  Patient Identification: [name] Ball  MRN:  [id number]  Date of Evaluation:  04/04/2017  Chief Complaint:  BIPOLAR 1 DISORDER CURRENT EPISODE ;MANIC SEVERE WITH PSYCHOTIC FEATURES  Principal Diagnosis: Bipolar affective disorder, current episode, mania with psychotic features.  Diagnosis:   Patient Active Problem List   Diagnosis Date Noted  . Bipolar disorder, current episode manic, severe with psychotic features ([place]) [F31.2] 04/03/2017  . Hepatitis C virus infection without hepatic coma [B19.20]   . Bipolar disorder ([place]) [F31.9]   . GERD (gastroesophageal reflux disease) [K21.9]    History of Present Illness: This is an admission assessment for this 51 year old AA female with hx of mental illness. 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 She is admitted to the [facility] with complaints of declining mental state, ataxia, worsening delusions, wandering tendencies, aggressive behavior & worsening delusions. During this assessment; [name] Ball reports, "I just went to put an application. I went to some kind of church. I came from [place]. My sister said you should not do that because I want some kind of family. I live [place] [address]. I don't take medicines. I got my apartment & my boyfriend is just there".  Objective: This patient is seen, chart reviewed. She is alert, grossly confused, disoriented & unaware of situation. Her speech is garbled, nonsensical & illogical. She wanders & paces needing frequent redirection.  Associated Signs/Symptoms:  Depression Symptoms:  Unale to assees, patient is a poor historin & highly confused.  (Hypo) Manic Symptoms:  Distractibility,  Anxiety Symptoms:  Unable to assess, patient is highly confused & a poor historian.  Psychotic Symptoms:  Unable to assess due to patient's confused state.  PTSD Symptoms: NA  Total Time spent with patient: 1 hour  Past Psychiatric History: Bipolar affective disorder.  Is  the patient at risk to self? No. she is closely monitored. Has the patient been a risk to self in the past 6 months? Unsure, unable to assess.  Has the patient been a risk to self within the distant past? Unsure, unable to assess.   Is the patient a risk to others?Unsure, unable to assess.    Has the patient been a risk to others in the past 6 months? Unsure, unable to assess.   Has the patient been a risk to others within the distant past? Unsure, unable to assess.    Prior Inpatient Therapy: Possibly, yes. Prior Outpatient Therapy: Possibly, yes.  Alcohol Screening: Patient refused Alcohol Screening Tool: Yes  Substance Abuse History in the last 12 months:  No.  Consequences of Substance Abuse: NA  Previous Psychotropic Medications: Yes   Psychological Evaluations: No   Past Medical History:  Past Medical History:  Diagnosis Date  . Bipolar disorder ([place])   . GERD (gastroesophageal reflux disease)   . Hepatitis C virus infection without hepatic coma     Past Surgical History:  Procedure Laterality Date  . abdominal cyst removed    . CESAREAN SECTION    . COLON SURGERY     Family History: History reviewed. No pertinent family history. Family Psychiatric  History: Unable to assess due to confused state.  Tobacco Screening: Have you used any form of tobacco in the last 30 days? (Cigarettes, Smokeless Tobacco, Cigars, and/or Pipes): Yes (per chart review) Tobacco use, Select all that apply: 4 or less cigarettes per day Are you interested in Tobacco Cessation Medications?: Yes, will notify MD for an order Counseled patient on smoking cessation including recognizing  danger situations, developing coping skills and basic information about quitting provided: Refused/Declined practical counseling  Social History:  History  Alcohol Use No     History  Drug Use No    Additional Social History: Pain Medications: See MAR Prescriptions: See MAR Over the Counter: See  MAR History of alcohol / drug use?: No history of alcohol / drug abuse Longest period of sobriety (when/how long): NA  Allergies:   Allergies  Allergen Reactions  . Trazodone And Nefazodone     Pt stated doesn't make her feel good   Lab Results:  Results for orders placed or performed during the hospital encounter of 03/31/17 (from the past 48 hour(s))  Valproic acid level     Status: None   Collection Time: 04/03/17 10:53 AM  Result Value Ref Range   Valproic Acid Lvl 91 50.0 - 100.0 ug/mL  Ammonia     Status: None   Collection Time: 04/03/17 10:53 AM  Result Value Ref Range   Ammonia 34 9 - 35 umol/L   Blood Alcohol level:  Lab Results  Component Value Date   ETH <5 03/31/2017   ETH <5 66/12/3014   Metabolic Disorder Labs:  No results found for: HGBA1C, MPG No results found for: PROLACTIN Lab Results  Component Value Date   CHOL 168 05/03/2012   TRIG 66 05/03/2012   HDL 66 (H) 05/03/2012   VLDL 13 05/03/2012   LDLCALC 89 05/03/2012   Current Medications: Current Facility-Administered Medications  Medication Dose Route Frequency Provider Last Rate Last Dose  . benztropine (COGENTIN) tablet 0.5 mg  0.5 mg Oral BID Rankin, Shuvon B, NP   0.5 mg at 04/04/17 0818  . diltiazem (DILACOR XR) 24 hr capsule 120 mg  120 mg Oral Daily Rankin, Shuvon B, NP   120 mg at 04/04/17 0817  . divalproex (DEPAKOTE) DR tablet 750 mg  750 mg Oral QHS Rankin, Shuvon B, NP      . haloperidol (HALDOL) tablet 5 mg  5 mg Oral TID Patriciaann Clan E, PA-C   5 mg at 04/04/17 0818  . ibuprofen (ADVIL,MOTRIN) tablet 200-400 mg  200-400 mg Oral Q4H PRN Rankin, Shuvon B, NP   400 mg at 04/03/17 2039  . LORazepam (ATIVAN) tablet 0.5 mg  0.5 mg Oral Q6H PRN Patriciaann Clan E, PA-C      . magnesium hydroxide (MILK OF MAGNESIA) suspension 30 mL  30 mL Oral Daily PRN Rankin, Shuvon B, NP      . nicotine (NICODERM CQ - dosed in mg/24 hr) patch 7 mg  7 mg Transdermal Daily Simon, Spencer E, PA-C      .  ondansetron (ZOFRAN) tablet 4 mg  4 mg Oral Q8H PRN Rankin, Shuvon B, NP      . pantoprazole (PROTONIX) EC tablet 40 mg  40 mg Oral Daily Rankin, Shuvon B, NP   40 mg at 04/04/17 0818  . QUEtiapine (SEROQUEL) tablet 50 mg  50 mg Oral QHS Patriciaann Clan E, PA-C       PTA Medications: Prescriptions Prior to Admission  Medication Sig Dispense Refill Last Dose  . benztropine (COGENTIN) 0.5 MG tablet Take 0.5 mg by mouth 2 (two) times daily.   unknown  . diltiazem (DILACOR XR) 120 MG 24 hr capsule Take 120 mg by mouth daily.   unknown  . divalproex (DEPAKOTE) 250 MG DR tablet Take 750 mg by mouth at bedtime.    unknown  . haloperidol (HALDOL) 10 MG tablet Take 10  mg by mouth 3 (three) times daily.   unknown  . haloperidol decanoate (HALDOL DECANOATE) 100 MG/ML injection Inject 100 mg into the muscle every 28 (twenty-eight) days.   unknown  . ibuprofen (ADVIL,MOTRIN) 200 MG tablet Take 200-400 mg by mouth every 4 (four) hours as needed for moderate pain.    unknown  . LORazepam (ATIVAN) 1 MG tablet Take 1 mg by mouth every 6 (six) hours as needed for anxiety.   unknown  . pantoprazole (PROTONIX) 20 MG tablet Take 20 mg by mouth daily.   unknown  . QUEtiapine (SEROQUEL) 50 MG tablet Take 50 mg by mouth at bedtime.   unknown  . traZODone (DESYREL) 100 MG tablet Take 100 mg by mouth at bedtime as needed for sleep.   unknown  . zolpidem (AMBIEN) 10 MG tablet Take 10 mg by mouth at bedtime.   unknown   Musculoskeletal: Strength & Muscle Tone: within normal limits Gait & Station: normal Patient leans: N/A  Psychiatric Specialty Exam: Physical Exam  ROS  Blood pressure 107/65, pulse (!) 103, temperature 98 F (36.7 C), temperature source Oral, resp. rate 18, height _0  (1.676 m), weight 81.6 kg (179 lb 14.3 oz).Body mass index is 29.04 kg/m.  General Appearance: Confused, disoriented, illogical  Eye Contact:  Fair  Speech:  Garbled, nonsensical  Volume:  Decreased  Mood:  Unable to assess,  patient is confused.  Affect:  Inappropriate  Thought Process:  Disorganized and Descriptions of Associations: Tangential  Orientation:  Other:  Orineted to name only  Thought Content:  Illogical and Tangential  Suicidal Thoughts:  Unable to answer  Homicidal Thoughts:  Unable to assess  Memory:  Immediate;   Poor Recent;   Poor Remote;   Poor  Judgement:  Impaired  Insight:  Lacking  Psychomotor Activity:  Patient is pacing along the unit hall ways, entering other patient's rooms   Concentration:  Concentration: Poor and Attention Span: Poor  Recall:  Poor  Fund of Knowledge:  Poor  Language:  Poor  Akathisia:  Negative  Handed:  Right  AIMS (if indicated):     Assets:  Social Support  ADL's:  Impaired  Cognition:  Impaired,  Severe  Sleep:  Number of Hours: 5.5   Treatment Plan/Recommendations: 1. Admit for crisis management and stabilization, estimated length of stay 3-5 days.   2. Medication management to reduce current symptoms to base line and improve the patient's overall level of functioning: See MAR, Md's SRA & Treatment plan.   3. Treat health problems as indicated.  4. Develop treatment plan to decrease risk of & the need for readmission.  5. Psycho-social education regarding self care.  6. Health care follow up as needed for medical problems.  7. Review, reconcile, and reinstate any pertinent home medications for other health issues where appropriate. 8. Call for consults with hospitalist for any additional specialty patient care services as needed.  Observation Level/Precautions:  15 minute checks  Laboratory:  Per ED  Psychotherapy: Group sessions  Medications: See [facility]   Consultations: As needed.   Discharge Concerns: Safety, mood stability.   Estimated LOS: 5-7 days  Other: Admit to the 500-Hall    Physician Treatment Plan for Primary Diagnosis: Will initiate medication management for mood stability. Set up an outpatient psychiatric services for medication  management. Will encourage medication adherence with psychiatric medications.  Long Term Goal(s): Improvement in symptoms so as ready for discharge  Short Term Goals: Ability to identify changes in lifestyle to  reduce recurrence of condition will improve and Ability to verbalize feelings will improve  Physician Treatment Plan for Secondary Diagnosis: Active Problems:   Bipolar disorder, current episode manic, severe with psychotic features ([employer])  Long Term Goal(s): Improvement in symptoms so as ready for discharge  Short Term Goals: Ability to identify changes in lifestyle to reduce recurrence of condition will improve, Ability to verbalize feelings will improve and Ability to demonstrate self-control will improve  I certify that inpatient services furnished can reasonably be expected to improve the patient's condition.    Encarnacion Slates, NP, PMHNP, FNP-BC. 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 She has a legal guardian ( Ms. Donnella Bi via [place]) . She is managed by ACT team.  At this time she is  limited historian and presents thought disordered, disorganized. Presented to ED restless, confused, disorganized , internally preoccupied  Chart notes indicate patient worsened following recent changes made to her medication regimen, resulting in decreased functional level , aggression, poor sleep. Reports from ACT team to CSW is that they suspect patient may have been cheeking /non compliant with PO medications prior to admission  At this time presents alert, pleasant, not angry or irritable,significantly  thought disordered, at times laughing or giggling inappropriately. Presents with  rambling speech at times,difficult to follow what she is saying . States, for example " moneymen, wait for a moment, I need to go get clothes, but I will come  back". She states she does experience auditory hallucinations at times, but I don't know what they are about ". She reports she sometimes feels like people want to " get her" but denies any suicidal or homicidal  Ideations. Denies feeling depressed at this time.  9/30 BMP and CBC unremarkable, 10/3 Ammonia serum level 34 10/3 Valproic Acid Serum level 91. 9/26 Head MRI poor quality due to motion but no acute abnormality noted  Dx-  Schizoaffective Disorder   Plan- Inpatient Admission. Continue Haldol 5 mgrs TID. Patient is also on Haldol Decanoate IM Q month- team will work on determining when next scheduled IM Haldol Injection is scheduled for. Continue Depakote 750 mgrs QHS . Continue Cogentin 0.5 mgrs BID. Continue Ativan 0.5 mgrs Q 6 hours PRN Anxiety .

## 2017-04-04 NOTE — Progress Notes (Signed)
Patient ID: [name] Ball, female   DOB: 07-18-1965, 51 y.o.   MRN: [id number]  Patients' legal guardian is [name]. She can be reached at [phone].

## 2017-04-04 NOTE — Progress Notes (Signed)
Recreation Therapy Notes  Date: 04/04/17 Time: 1000 Location: 500 Hall Dayroom  Group Topic: Communication, Team Building, Problem Solving  Goal Area(s) Addresses:  Patient will effectively work with peer towards shared goal.  Patient will identify skill used to make activity successful.  Patient will identify how skills used during activity can be used to reach post d/c goals.   Behavioral Response: Minimal  Intervention: Rubber discs  Activity: Each patient was given a rubber disc with one extra disc on the nurse's station.  Patients were to work together to get to the end of the hall and back to their starting point.    Education: Education officer, community, Dentist.   Education Outcome: Acknowledges education/In group clarification offered/Needs additional education.   Clinical Observations/Feedback:  Pt attempted to participate but was unable to focus and stay on task.  Pt stated she was seeing people she was related to.    Victorino Sparrow, LRT/CTRS         Victorino Sparrow A 04/04/2017 10:52 AM

## 2017-04-05 DIAGNOSIS — R450 Nervousness: Secondary | ICD-10-CM

## 2017-04-05 DIAGNOSIS — G4700 Insomnia, unspecified: Secondary | ICD-10-CM

## 2017-04-05 DIAGNOSIS — F419 Anxiety disorder, unspecified: Secondary | ICD-10-CM

## 2017-04-05 DIAGNOSIS — R443 Hallucinations, unspecified: Secondary | ICD-10-CM

## 2017-04-05 LAB — HEMOGLOBIN A1C
Hgb A1c MFr Bld: 4.6 % — ABNORMAL LOW (ref 4.8–5.6)
Mean Plasma Glucose: 85.32 mg/dL

## 2017-04-05 LAB — LIPID PANEL
Cholesterol: 130 mg/dL (ref 0–200)
HDL: 57 mg/dL (ref >40)
LDL Cholesterol: 63 mg/dL (ref 0–99)
TRIGLYCERIDES: 51 mg/dL (ref <150)
Total CHOL/HDL Ratio: 2.3 RATIO
VLDL: 10 mg/dL (ref 0–40)

## 2017-04-05 LAB — RPR: RPR: NONREACTIVE

## 2017-04-05 LAB — FOLATE: Folate: 21.1 ng/mL (ref >5.9)

## 2017-04-05 LAB — TSH: TSH: 0.94 u[IU]/mL (ref 0.350–4.500)

## 2017-04-05 LAB — VITAMIN B12: VITAMIN B 12: 810 pg/mL (ref 180–914)

## 2017-04-05 MED ORDER — HALOPERIDOL DECANOATE 100 MG/ML IM SOLN
150.0000 mg | Freq: Once | INTRAMUSCULAR | Status: AC
  Administered 2017-04-05: 150 mg via INTRAMUSCULAR
  Filled 2017-04-05: qty 2
  Filled 2017-04-05: qty 1.5

## 2017-04-05 NOTE — Progress Notes (Signed)
Adult Psychoeducational Group Note  Date:  04/05/2017 Time:  11:22 PM  Group Topic/Focus:  Wrap-Up Group:   The focus of this group is to help patients review their daily goal of treatment and discuss progress on daily workbooks.  Participation Level:  Did Not Attend  Participation Quality:  Did not attend  Affect:  Did not attend  Cognitive:  Did not attend  Insight: None  Engagement in Group:  Did not attend  Modes of Intervention:  Did not attend  Additional Comments:  Pt did not attend the evening wrap up group.  Candy Sledge 04/05/2017, 11:22 PM

## 2017-04-05 NOTE — Progress Notes (Signed)
D: Pt A to self and place. Denies SI, HI, AVH and pain. However, pt presents anxious, restless and preoccupied. Pt is disorganized with flight of ideas. Per pt "my sister is coming to get me now, I just need to get my clothes baby and get outside, why are you leaving me here". Continues to talk off topic during verbal interaction and intrusive with staff and peers. Spent most of this shift at nursing station and at double doors requiring multiple verbal redirections throughout this shift. A: Medications given as per MD's orders. Emotional support and encouragement offered to pt. Q 15 minutes safety checks maintained without issues.  R: Pt compliant with medications. Denies adverse drug reactions. Tolerates all PO intake well. POC remains effective for mood stability and safety.

## 2017-04-05 NOTE — Social Work (Signed)
MAR requested from [facility].    Edwyna Shell, LCSW Lead Clinical Social Worker Phone:  [phone]

## 2017-04-05 NOTE — Progress Notes (Signed)
Writer has observed patient pacing the hallway and standing in front of the door leading off the 500 hall the majority of the evening. She is redirectable when asked to step back from the door. She has asked several times if she can leave now and that her daughter is coming to pick her up. She has flight of ideas and is preoccupied with leaving. Writer encouraged her to take her medication which she eventually did after much persuasion. Writer informed her that she would not be leaving tonight and her daughter was not her to pick her up. Writer  encouraged her to get some rest for tomorrow. She eventually went to her room and laid down. Safety maintained on unit with 15 min checks.

## 2017-04-05 NOTE — [facility] Group Notes (Signed)
[place] LCSW Group Therapy  04/05/2017 3:13 PM  Type of Therapy:  Group Therapy  Participation Level: Invited,did not attend  Summary of Progress/Problems:  Chaplain led group explored concept of hope and its relevance to mental health recovery.  Patients explored themes including what matters to them personally, how others responses are similar/different, and what they are hopeful for.  Group members discussed relevance of social supports, innter strength and using their own stories to craft a recovery path.    [name] Ball

## 2017-04-05 NOTE — Progress Notes (Signed)
Recreation Therapy Notes  04/05/17  1245:  LRT attempted to do assessment with pt.  Pt was unable to focus.  Pt was preoccupied with going outside to get a cigarette.  Pt also stated she could see her sister and son.  Pt even mentioned seeing smoke in the nurses station.  LRT unable to complete assessment with pt.   Victorino Sparrow, LRT/CTRS        Ria Comment, Windle Huebert A 04/05/2017 2:01 PM

## 2017-04-05 NOTE — Progress Notes (Signed)
[facility] MD Progress Note  04/05/2017 2:18 PM [name] Ball  MRN:  [id number]   Subjective:  Patient evaluated by NP and MD. [name] Ball reports she need to get home to answer the amber alert on her son. Noted that patient was admitted due to delusion and paroniria.  Seen to have disorganized and tangential thoughts. Per staffing notes patient has poor insight with personal space and boundaries issuse.  -[facility] faxed over patient current medication's. Noted that Haloperidol is due, NP to order. Pharmacy has reviewed current home medication that was provided via fax. Support, Encouragement and Reassurance was provided.     Principal Problem: <principal problem not specified> Diagnosis:   Patient Active Problem List   Diagnosis Date Noted  . Bipolar disorder, current episode manic, severe with psychotic features ([place]) [F31.2] 04/03/2017  . Hepatitis C virus infection without hepatic coma [B19.20]   . Bipolar disorder ([place]) [F31.9]   . GERD (gastroesophageal reflux disease) [K21.9]    Total Time spent with patient: 30 minutes  Past Psychiatric History:   Past Medical History:  Past Medical History:  Diagnosis Date  . Bipolar disorder ([place])   . GERD (gastroesophageal reflux disease)   . Hepatitis C virus infection without hepatic coma     Past Surgical History:  Procedure Laterality Date  . abdominal cyst removed    . CESAREAN SECTION    . COLON SURGERY     Family History: History reviewed. No pertinent family history. Family Psychiatric  History:  Social History:  History  Alcohol Use No     History  Drug Use No    Social History   Social History  . Marital status: Unknown    Spouse name: N/A  . Number of children: N/A  . Years of education: N/A   Social History Main Topics  . Smoking status: Current Every Day Smoker  . Smokeless tobacco: Never Used  . Alcohol use No  . Drug use: No  . Sexual activity: Not Asked   Other Topics Concern  . None    Social History Narrative  . None   Additional Social History:    Pain Medications: See MAR Prescriptions: See MAR Over the Counter: See MAR History of alcohol / drug use?: No history of alcohol / drug abuse Longest period of sobriety (when/how long): NA                    Sleep: Fair  Appetite:  Fair  Current Medications: Current Facility-Administered Medications  Medication Dose Route Frequency Provider Last Rate Last Dose  . benztropine (COGENTIN) tablet 0.5 mg  0.5 mg Oral BID Rankin, Shuvon B, NP   0.5 mg at 04/05/17 0808  . diltiazem (DILACOR XR) 24 hr capsule 120 mg  120 mg Oral Daily Rankin, Shuvon B, NP   120 mg at 04/05/17 0808  . divalproex (DEPAKOTE) DR tablet 750 mg  750 mg Oral QHS Rankin, Shuvon B, NP   750 mg at 04/04/17 1958  . haloperidol (HALDOL) tablet 5 mg  5 mg Oral TID Patriciaann Clan E, PA-C   5 mg at 04/05/17 1214  . haloperidol decanoate (HALDOL DECANOATE) 100 MG/ML injection 150 mg  150 mg Intramuscular Once Derrill Center, NP      . ibuprofen (ADVIL,MOTRIN) tablet 200-400 mg  200-400 mg Oral Q4H PRN Rankin, Shuvon B, NP   400 mg at 04/04/17 1958  . LORazepam (ATIVAN) tablet 0.5 mg  0.5 mg Oral Q6H PRN Laverle Hobby,  PA-C   0.5 mg at 04/04/17 1609  . magnesium hydroxide (MILK OF MAGNESIA) suspension 30 mL  30 mL Oral Daily PRN Rankin, Shuvon B, NP      . nicotine (NICODERM CQ - dosed in mg/24 hr) patch 7 mg  7 mg Transdermal Daily Patriciaann Clan E, PA-C   7 mg at 04/05/17 8250  . ondansetron (ZOFRAN) tablet 4 mg  4 mg Oral Q8H PRN Rankin, Shuvon B, NP      . pantoprazole (PROTONIX) EC tablet 40 mg  40 mg Oral Daily Rankin, Shuvon B, NP   40 mg at 04/05/17 0808  . zolpidem (AMBIEN) tablet 5 mg  5 mg Oral QHS PRN Cobos, Myer Peer, MD   5 mg at 04/04/17 2144    Lab Results:  Results for orders placed or performed during the hospital encounter of 04/03/17 (from the past 48 hour(s))  Vitamin B12     Status: None   Collection Time: 04/05/17   6:32 AM  Result Value Ref Range   Vitamin B-12 810 180 - 914 pg/mL    Comment: (NOTE) This assay is not validated for testing neonatal or myeloproliferative syndrome specimens for Vitamin B12 levels. Performed at [facility], [place] [address], [place], [place] [address]   Folate     Status: None   Collection Time: 04/05/17  6:32 AM  Result Value Ref Range   Folate 21.1 >5.9 ng/mL    Comment: Performed at [place] [address] [place], [place] [address]  TSH     Status: None   Collection Time: 04/05/17  6:32 AM  Result Value Ref Range   TSH 0.940 0.350 - 4.500 uIU/mL    Comment: Performed by a 3rd Generation assay with a functional sensitivity of <=0.01 uIU/mL. Performed at [facility], [place] [address] [place], [place] [address]   Lipid panel     Status: None   Collection Time: 04/05/17  6:32 AM  Result Value Ref Range   Cholesterol 130 0 - 200 mg/dL   Triglycerides 51 <150 mg/dL   HDL 57 >40 mg/dL   Total CHOL/HDL Ratio 2.3 RATIO   VLDL 10 0 - 40 mg/dL   LDL Cholesterol 63 0 - 99 mg/dL    Comment:        Total Cholesterol/HDL:CHD Risk Coronary Heart Disease Risk Table                     Men   Women  1/2 Average Risk   3.4   3.3  Average Risk       5.0   4.4  2 X Average Risk   9.6   7.1  3 X Average Risk  23.4   11.0        Use the calculated Patient Ratio above and the CHD Risk Table to determine the patient's CHD Risk.        ATP III CLASSIFICATION (LDL):  <100     mg/dL   Optimal  100-129  mg/dL   Near or Above                    Optimal  130-159  mg/dL   Borderline  160-189  mg/dL   High  >190     mg/dL   Very High Performed at [place] [address], [place], [place] [address]   Hemoglobin A1c     Status: Abnormal  Collection Time: 04/05/17  6:32 AM  Result Value Ref Range   Hgb A1c MFr Bld 4.6 (L) 4.8 - 5.6 %    Comment: (NOTE) Pre diabetes:          5.7%-6.4% Diabetes:              >6.4% Glycemic  control for   <7.0% adults with diabetes    Mean Plasma Glucose 85.32 mg/dL    Comment: Performed at [place] [address] [place], [place] [address]    Blood Alcohol level:  Lab Results  Component Value Date   [facility] <5 03/31/2017   ETH <5 43/15/4008    Metabolic Disorder Labs: Lab Results  Component Value Date   HGBA1C 4.6 (L) 04/05/2017   MPG 85.32 04/05/2017   No results found for: PROLACTIN Lab Results  Component Value Date   CHOL 130 04/05/2017   TRIG 51 04/05/2017   HDL 57 04/05/2017   CHOLHDL 2.3 04/05/2017   VLDL 10 04/05/2017   LDLCALC 63 04/05/2017   LDLCALC 89 05/03/2012    Physical Findings: AIMS:  , ,  ,  ,    CIWA:    COWS:     Musculoskeletal: Strength & Muscle Tone: within normal limits Gait & Station: normal Patient leans: N/A  Psychiatric Specialty Exam: Physical Exam  Vitals reviewed. Neurological: She is alert.  Psychiatric: She has a normal mood and affect.    Review of Systems  Psychiatric/Behavioral: Positive for depression and hallucinations. The patient is nervous/anxious.     Blood pressure 105/85, pulse 74, temperature 98.5 F (36.9 C), temperature source Oral, resp. rate 18, height 5\' 6"  (1.676 m), weight 81.6 kg (179 lb 14.3 oz), SpO2 98 %.Body mass index is 29.04 kg/m.  General Appearance: Disheveled and Disorganized per MD patient noted to have slight improvement   Eye Contact:  Fair  Speech:  Clear and Coherent and mumbled   Volume:  Normal  Mood:  Anxious  Affect:  Patient reports feeling okay and ready to go  Thought Process:  Disorganized and Descriptions of Associations: Tangential  Orientation:  Other:  Person and Place  Thought Content:  Illogical, Paranoid Ideation and Tangential  Suicidal Thoughts:  UTA  Homicidal Thoughts:  UTA  Memory:  NA  Judgement:  Poor  Insight:  disoragnized thoughts UTA  Psychomotor Activity:  Normal- seen throughout the milieu   Concentration:  Concentration: Poor   Recall:  [employer] of Knowledge:  Poor  Language:  Poor  Akathisia:  No  Handed:  Right  AIMS (if indicated):     Assets:  Desire for Improvement Resilience Social Support  ADL's:  Intact  Cognition:  Impaired,  Severe  Sleep:  Number of Hours: 6     I agree with current treatment plan on 04/05/2017, Patient seen face-to-face for psychiatric evaluation follow-up, chart reviewed and case discussed with the MD [place]  and Treatment team. Reviewed the information documented and agree with the treatment plan.  Treatment Plan Summary: Daily contact with patient to assess and evaluate symptoms and progress in treatment and Medication management   Continue Depakote 750 mg and Haldol 5 mg TID - Monthly injection Decanoate 150 mg 04/05/2017  mood stabilization. Continue with Ambien 5mg  for insomnia Will continue to monitor vitals ,medication compliance and treatment side effects while patient is here.  - [facility] faxed over home medication ( was reviewed by pharmacy Elene P)  CSW will start working on disposition.  Patient to participate in therapeutic milieu  Derrill Center, NP 04/05/2017, 2:18 PM   Agree with NP Progress Note

## 2017-04-05 NOTE — Progress Notes (Signed)
Recreation Therapy Notes  Date: 04/05/17 Time: 1000 Location: 500 Hall Dayroom  Group Topic: Leisure Education  Goal Area(s) Addresses:  Patient will identify positive leisure activities.  Patient will identify one positive benefit of participation in leisure activities.   Behavioral Response: None  Intervention: [employer], dry erase marker, eraser, container with words  Activity: Leisure Pictionary.  Patients were divided into two groups.  One person from the group would pick a slip of paper from the container.  Patient would draw the picture of whatever word they picked on the board.  That team would have one minute to draw and guess what the picture is.  After the minute is up, they would get one final guess.  If the team doesn't guess the picture, the other team gets a chance to steal the point.  Education:  Leisure Education, Dentist  Education Outcome: Acknowledges education/In group clarification offered/Needs additional education  Clinical Observations/Feedback: Pt was unable to focus on activity.  Pt was in and out of group.  Pt eventually left and did not return.   Victorino Sparrow, LRT/CTRS         Victorino Sparrow A 04/05/2017 12:08 PM

## 2017-04-06 LAB — PROLACTIN: Prolactin: 50.6 ng/mL — ABNORMAL HIGH (ref 4.8–23.3)

## 2017-04-06 LAB — VITAMIN D 25 HYDROXY (VIT D DEFICIENCY, FRACTURES): Vit D, 25-Hydroxy: 24 ng/mL — ABNORMAL LOW (ref 30.0–100.0)

## 2017-04-06 MED ORDER — OLANZAPINE 10 MG IM SOLR: 5.0000 mg | Freq: Two times a day (BID) | INTRAMUSCULAR | Status: DC | PRN

## 2017-04-06 MED ORDER — OLANZAPINE 5 MG PO TBDP
5.0000 mg | ORAL_TABLET | Freq: Two times a day (BID) | ORAL | Status: DC | PRN
  Administered 2017-04-15 – 2017-04-21 (×2): 5 mg via ORAL
  Filled 2017-04-06 (×3): qty 1

## 2017-04-06 MED ORDER — HALOPERIDOL DECANOATE 100 MG/ML IM SOLN: 150.0000 mg | INTRAMUSCULAR | Status: DC

## 2017-04-06 MED ORDER — HALOPERIDOL 5 MG PO TABS
10.0000 mg | ORAL_TABLET | Freq: Two times a day (BID) | ORAL | Status: DC
  Administered 2017-04-06 – 2017-04-22 (×32): 10 mg via ORAL
  Filled 2017-04-06 (×37): qty 2
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
## 2017-04-06 NOTE — B[facility] Group Notes (Signed)
  [facility]/BMU LCSW Group Therapy Note  Date/Time:  04/06/2017 11:15AM-12:00PM  Type of Therapy and Topic:  Group Therapy:  Feelings About Hospitalization  Participation Level:  Minimal   Description of Group This process group involved patients discussing their feelings related to being hospitalized, as well as the benefits they see to being in the hospital.  These feelings and benefits were itemized.  The group then brainstormed specific ways in which they could seek those same benefits when they discharge and return home.  Therapeutic Goals 1. Patient will identify and describe positive and negative feelings related to hospitalization 2. Patient will verbalize benefits of hospitalization to themselves personally 3. Patients will brainstorm together ways they can obtain similar benefits in the outpatient setting, identify barriers to wellness and possible solutions  Summary of Patient Progress:  The patient expressed her primary feelings about being hospitalized are "at first I didn't want to adapt but then I changed."  When she did share comments in group they were off topic and did not make sense.  She eventually left group.  Therapeutic Modalities Cognitive Behavioral Therapy Motivational Interviewing    Selmer Dominion, LCSW 04/06/2017, 9:53 AM
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
## 2017-04-07 MED ORDER — LOPERAMIDE HCL 2 MG PO CAPS
ORAL_CAPSULE | ORAL | Status: AC
  Administered 2017-04-07: 2 mg
  Filled 2017-04-07: qty 2

## 2017-04-07 MED ORDER — LOPERAMIDE HCL 2 MG PO CAPS: 2.0000 mg | ORAL_CAPSULE | ORAL | Status: DC | PRN

## 2017-04-07 NOTE — Progress Notes (Signed)
Psychoeducational Group Note  Date:  04/07/2017 Time:  2030  Group Topic/Focus:  wrap up group  Participation Level: Did Not Attend  Participation Quality:  Not Applicable  Affect:  Not Applicable  Cognitive:  Not Applicable  Insight:  Not Applicable  Engagement in Group: Not Applicable  Additional Comments:  Pt was notified that group was beginning but remained in bed.   Winfield Rast S 04/07/2017, 9:06 PM

## 2017-04-07 NOTE — Progress Notes (Signed)
Writer observed patient in the dayroom attending group tonight. She reported that she had a good day but then started to ramble about many different subjects. When others were sharing she would laugh out loud at  inappropriate times. She was complaint with her medications tonight and still speaks about needing to take care of her babies. She has stood at the door only a few times tonight and has been redirectable. Support given and safety maintained on unit with 15 min checks.

## 2017-04-07 NOTE — Progress Notes (Signed)
D: Pt presents less irritable and tearful this shift in comparison to previous day. Remains disorganized with loose associations "I told their father to cut and eat the kid up" when asked about how she was doing today". Continue to make request to see her children and to go off the unit. Pt showered and changed clothes. Continue to need redirections and has been effective some what this shift.  A: Medications administered  as per MD's orders with verbal education and effects monitored. Safety checks continues at Q 15 minutes intervals without self harm gestures. Still unable to obtain urine for UA & culture this shift. R: Pt tolerated all PO intake well. Compliant with medications when offered. Denies adverse drug reactions when assessed. Remains safe on unit. POC continues for safety and mood stability.

## 2017-04-07 NOTE — Progress Notes (Signed)
[facility] MD Progress Note  04/07/2017 11:37 AM [name] Ball  MRN:  [id number]   Subjective: [name] Ball reports " I want to go home"   Objective:[name] Ball is awake, alert. Seen standing at the nursing stations. Patient is tangential and disorganized and needs continuous redirection. RN reports patient is taking medications and no side effects noted. Patient continues to report concerns with her children and ex husband. Support, encouragement and reassurance was provided.        Principal Problem: Bipolar disorder, current episode manic, severe with psychotic features ([place]) Diagnosis:   Patient Active Problem List   Diagnosis Date Noted  . Bipolar disorder, current episode manic, severe with psychotic features ([place]) [F31.2] 04/03/2017  . Hepatitis C virus infection without hepatic coma [B19.20]   . Bipolar disorder ([place]) [F31.9]   . GERD (gastroesophageal reflux disease) [K21.9]    Total Time spent with patient: 25 minutes  Past Psychiatric History: Please see H&P.   Past Medical History:  Past Medical History:  Diagnosis Date  . Bipolar disorder ([place])   . GERD (gastroesophageal reflux disease)   . Hepatitis C virus infection without hepatic coma     Past Surgical History:  Procedure Laterality Date  . abdominal cyst removed    . CESAREAN SECTION    . COLON SURGERY     Family History: Please see H&P.  Family Psychiatric  History: Please see H&P.  Social History: Please see H&P.  History  Alcohol Use No     History  Drug Use No    Social History   Social History  . Marital status: Unknown    Spouse name: N/A  . Number of children: N/A  . Years of education: N/A   Social History Main Topics  . Smoking status: Current Every Day Smoker  . Smokeless tobacco: Never Used  . Alcohol use No  . Drug use: No  . Sexual activity: Not Asked   Other Topics Concern  . None   Social History Narrative  . None   Additional Social History:    Pain Medications: See  MAR Prescriptions: See MAR Over the Counter: See MAR History of alcohol / drug use?: No history of alcohol / drug abuse Longest period of sobriety (when/how long): NA                    Sleep: Fair  Appetite:  Fair  Current Medications: Current Facility-Administered Medications  Medication Dose Route Frequency Provider Last Rate Last Dose  . benztropine (COGENTIN) tablet 0.5 mg  0.5 mg Oral BID Rankin, Shuvon B, NP   0.5 mg at 04/07/17 0745  . diltiazem (DILACOR XR) 24 hr capsule 120 mg  120 mg Oral Daily Rankin, Shuvon B, NP   120 mg at 04/07/17 0747  . divalproex (DEPAKOTE) DR tablet 750 mg  750 mg Oral QHS Rankin, Shuvon B, NP   750 mg at 04/06/17 2104  . haloperidol (HALDOL) tablet 10 mg  10 mg Oral BID Ursula Alert, MD   10 mg at 04/07/17 0745  . [START ON 05/06/2017] haloperidol decanoate (HALDOL DECANOATE) 100 MG/ML injection 150 mg  150 mg Intramuscular Q30 days Eappen, Ria Clock, MD      . ibuprofen (ADVIL,MOTRIN) tablet 200-400 mg  200-400 mg Oral Q4H PRN Rankin, Shuvon B, NP   400 mg at 04/04/17 1958  . magnesium hydroxide (MILK OF MAGNESIA) suspension 30 mL  30 mL Oral Daily PRN Rankin, Shuvon B, NP      .  nicotine (NICODERM CQ - dosed in mg/24 hr) patch 7 mg  7 mg Transdermal Daily Patriciaann Clan E, PA-C   7 mg at 04/07/17 0745  . OLANZapine zydis (ZYPREXA) disintegrating tablet 5 mg  5 mg Oral BID PRN Ursula Alert, MD       Or  . OLANZapine (ZYPREXA) injection 5 mg  5 mg Intramuscular BID PRN Eappen, Saramma, MD      . ondansetron (ZOFRAN) tablet 4 mg  4 mg Oral Q8H PRN Rankin, Shuvon B, NP      . pantoprazole (PROTONIX) EC tablet 40 mg  40 mg Oral Daily Rankin, Shuvon B, NP   40 mg at 04/07/17 0745  . zolpidem (AMBIEN) tablet 5 mg  5 mg Oral QHS PRN Nidhi Jacome, Myer Peer, MD   5 mg at 04/06/17 2104    Lab Results:  No results found for this or any previous visit (from the past 48 hour(s)).  Blood Alcohol level:  Lab Results  Component Value Date   ETH <5  03/31/2017   ETH <5 32/20/2542    Metabolic Disorder Labs: Lab Results  Component Value Date   HGBA1C 4.6 (L) 04/05/2017   MPG 85.32 04/05/2017   Lab Results  Component Value Date   PROLACTIN 50.6 (H) 04/05/2017   Lab Results  Component Value Date   CHOL 130 04/05/2017   TRIG 51 04/05/2017   HDL 57 04/05/2017   CHOLHDL 2.3 04/05/2017   VLDL 10 04/05/2017   LDLCALC 63 04/05/2017   LDLCALC 89 05/03/2012    Physical Findings: AIMS:  , ,  ,  ,    CIWA:    COWS:     Musculoskeletal: Strength & Muscle Tone: within normal limits Gait & Station: normal Patient leans: N/A  Psychiatric Specialty Exam: Physical Exam  Nursing note and vitals reviewed.   Review of Systems  Psychiatric/Behavioral: Positive for depression and hallucinations. The patient is nervous/anxious and has insomnia.   All other systems reviewed and are negative.   Blood pressure (!) 118/103, pulse (!) 116, temperature 98.2 F (36.8 C), temperature source Oral, resp. rate 18, height 5\' 6"  (1.676 m), weight 81.6 kg (179 lb 14.3 oz), SpO2 98 %.Body mass index is 29.04 kg/m.  General Appearance: Guarded   Eye Contact:  Fair  Speech:  Normal Rate   Volume:  Normal  Mood:  Anxious and Depressed  Affect:  Labile  Thought Process:  Disorganized, Irrelevant and Descriptions of Associations: Tangential  Orientation:  Other:  self  Thought Content:  Delusions, Hallucinations: Auditory, Ideas of Reference:   Paranoia Delusions, Paranoid Ideation, Rumination and Tangential  Suicidal Thoughts:  No  Homicidal Thoughts:  No  Memory:  Immediate;   unable to assess Recent;   Poor Remote;   Poor  Judgement:  Poor  Insight:  Shallow  Psychomotor Activity:  Restlessness  Concentration:  Concentration: Poor and Attention Span: Poor  Recall:  Poor  Fund of Knowledge:  Poor  Language:  Fair  Akathisia:  No  Handed:  Right  AIMS (if indicated):     Assets:  Desire for Improvement Resilience Social Support   ADL's:  Intact  Cognition:  Impaired,  Mild  Sleep:  Number of Hours: 6.75     Treatment Plan Summary: Daily contact with patient to assess and evaluate symptoms and progress in treatment and Medication management    Will continue today 04/07/17 plan as below except where it is noted.  Continue Depakote ER 750 mg po qhs  for mood sx. Depakote level therapeutic on admission. Continue Haldol to 10 mg po bid for psychosis. Haldol decanoate IM 150 mg - received 04/05/2017 - repeat q30 days. Discontinue Ativan - unknown if this is causing cognitive issues. Will continue Ambien 5 mg po qhs for insomnia. Order UA /Urine culture- pending  CSW will continue to work on disposition.   Derrill Center, NP 04/07/2017, 11:37 AM Agree with NP Progress Note

## 2017-04-07 NOTE — [facility] Group Notes (Signed)
[facility] LCSW Group Therapy Note  Date/Time:  04/07/2017  11:00AM-12:00PM  Type of Therapy and Topic:  Group Therapy:  Music and Mood  Participation Level:  Minimal   Description of Group: In this process group, members listened to a variety of genres of music and identified that different types of music evoke different responses.  Patients were encouraged to identify music that was soothing for them and music that was energizing for them.  Patients discussed how this knowledge can help with wellness and recovery in various ways including managing depression and anxiety as well as encouraging healthy sleep habits.    Therapeutic Goals: 1. Patients will explore the impact of different varieties of music on mood 2. Patients will verbalize the thoughts they have when listening to different types of music 3. Patients will identify music that is soothing to them as well as music that is energizing to them 4. Patients will discuss how to use this knowledge to assist in maintaining wellness and recovery 5. Patients will explore the use of music as a coping skill  Summary of Patient Progress:  At the beginning of group, patient expressed she felt "ready to go" and at the end of group said she felt "good, deeper."  She was in and out of group quite a bit.  She had obvious reactions to song, especially enjoying the beat.  Therapeutic Modalities: Solution Focused Brief Therapy Motivational Interviewing Activity   Selmer Dominion, LCSW 04/07/2017 8:33 AM

## 2017-04-07 NOTE — Progress Notes (Signed)
Adult Psychoeducational Group Note  Date:  04/07/2017 Time:  4:23 AM  Group Topic/Focus:  Wrap-Up Group:   The focus of this group is to help patients review their daily goal of treatment and discuss progress on daily workbooks.  Participation Level:  Active  Participation Quality:  Appropriate  Affect:  Appropriate  Cognitive:  Appropriate  Insight: Appropriate  Engagement in Group:  Engaged  Modes of Intervention:  Discussion  Additional Comments:  Pt stated her goal for today was to get some rest. Pt stated that she accomplished her goal and felt good about it. Pt rated her day and 8 out of 10. Pt stated she attended all groups held today.  Candy Sledge 04/07/2017, 4:23 AM

## 2017-04-08 NOTE — Progress Notes (Signed)
Writer spoke with patient 1:1 briefly tonight. She has been more isolative to her room. She came to nursing station and Probation officer inquired about how her day had been she reported good and bad. She then started talking about her children. She has not been standing at the doorway tonight but did Artist that she is ready to go so she can be outside.  She came to Probation officer and reported that she has diarrhea shortly after taking her hs medication, imodium was given, Probation officer will monitor effectiveness of medication. Support given and safety maintained on unit with 25 min checks.

## 2017-04-08 NOTE — [facility] Group Notes (Signed)
LCSW Group Therapy Note   04/08/2017 1:15pm   Type of Therapy and Topic:  Group Therapy:  Overcoming Obstacles   Participation Level:  None   Description of Group:    In this group patients will be encouraged to explore what they see as obstacles to their own wellness and recovery. They will be guided to discuss their thoughts, feelings, and behaviors related to these obstacles. The group will process together ways to cope with barriers, with attention given to specific choices patients can make. Each patient will be challenged to identify changes they are motivated to make in order to overcome their obstacles. This group will be process-oriented, with patients participating in exploration of their own experiences as well as giving and receiving support and challenge from other group members.   Therapeutic Goals: 1. Patient will identify personal and current obstacles as they relate to admission. 2. Patient will identify barriers that currently interfere with their wellness or overcoming obstacles.  3. Patient will identify feelings, thought process and behaviors related to these barriers. 4. Patient will identify two changes they are willing to make to overcome these obstacles:      Summary of Patient Progress   In and out multiple times.  When she settled long enough to engage, talked about how beautiful the clouds are, and how they are constantly changing.  We all took a minute to look outside and enjoy the clouds.  She appeared to appreciate this gesture.   Therapeutic Modalities:   Cognitive Behavioral Therapy Solution Focused Therapy Motivational Interviewing Relapse Prevention Therapy  Trish Mage, LCSW 04/08/2017 4:29 PM

## 2017-04-08 NOTE — Plan of Care (Signed)
Problem: Education: Goal: Will be free of psychotic symptoms Outcome: Progressing Nurse discussed depression/anxiety/coping skills with patient.   

## 2017-04-08 NOTE — Progress Notes (Signed)
Recreation Therapy Notes  Date: 04/08/17 Time: 1000 Location: 500 Hall Dayroom  Group Topic: Coping Skills  Goal Area(s) Addresses:  Patients will be able to identify positive coping skills. Patients will be able to identify the benefits of using coping skills post d/c.  Behavioral Response: None  Intervention:  Worksheet, pencils  Activity: Mind map.  Patients were given a blank mind map.  LRT and patients filled in the first eight boxes together with things that would cause them to use coping skills.  Patients identified depression, anxiety, family, resentment, anguish, placement, tiredness and loneliness.  Patients were to then identify three coping skills for each area identified.  Education: Radiographer, therapeutic, Dentist.   Education Outcome: Acknowledges understanding/In group clarification offered/Needs additional education.   Clinical Observations/Feedback: Pt sat and observed.  Pt did not participate.  Pt left early and did not return.    Victorino Sparrow, LRT/CTRS         Victorino Sparrow A 04/08/2017 12:55 PM

## 2017-04-08 NOTE — Progress Notes (Addendum)
NP stated this patient is off Unit Restriction.  Charge nurse informed.

## 2017-04-08 NOTE — Progress Notes (Signed)
Patient is on unit restriction.   Patient came to medication window this morning for medications.  Patient stated needs clothes/shoes before she is discharged.  Patient denied SI and HI this morning.  Denied A/V hallucinations.  Has been sitting in dayroom this morning.  Respirations even and unlabored.  No signs/symptoms of pain/distress noted on patient's face/body movements.   Safety maintained with 15 minute checks.

## 2017-04-08 NOTE — Progress Notes (Signed)
[name] MD Progress Note  04/08/2017 2:16 PM [name] Ball  MRN:  [id number]    Subjective: " I want to go outside too"   Objective:[name] Ball is awake, alert. Evaluated by NP and MD. Seen resting in bedroom. Patient mood and affect is improving. Patient is less tangential during this discussion and continues to ruminate with discharging and seeing her children. Patient is less intrusive and redirectable.Patient is requesting to discharge and is tearful throughout this assessment. [name] Ball is taken her medication as prescribed and tolerating medications well. Support, encouragement and reassurance was provided.        Principal Problem: Bipolar disorder, current episode manic, severe with psychotic features ([name]) Diagnosis:   Patient Active Problem List   Diagnosis Date Noted  . Bipolar disorder, current episode manic, severe with psychotic features ([name]) [F31.2] 04/03/2017  . Hepatitis C virus infection without hepatic coma [B19.20]   . Bipolar disorder ([name]) [F31.9]   . GERD (gastroesophageal reflux disease) [K21.9]    Total Time spent with patient: 25 minutes  Past Psychiatric History: Please see H&P.   Past Medical History:  Past Medical History:  Diagnosis Date  . Bipolar disorder ([name])   . GERD (gastroesophageal reflux disease)   . Hepatitis C virus infection without hepatic coma     Past Surgical History:  Procedure Laterality Date  . abdominal cyst removed    . CESAREAN SECTION    . COLON SURGERY     Family History: Please see H&P.  Family Psychiatric  History: Please see H&P.  Social History: Please see H&P.  History  Alcohol Use No     History  Drug Use No    Social History   Social History  . Marital status: Unknown    Spouse name: N/A  . Number of children: N/A  . Years of education: N/A   Social History Main Topics  . Smoking status: Current Every Day Smoker  . Smokeless tobacco: Never Used  . Alcohol use No  . Drug use: No  . Sexual  activity: Not Asked   Other Topics Concern  . None   Social History Narrative  . None   Additional Social History:    Pain Medications: See MAR Prescriptions: See MAR Over the Counter: See MAR History of alcohol / drug use?: No history of alcohol / drug abuse Longest period of sobriety (when/how long): NA                    Sleep: Fair  Appetite:  Fair  Current Medications: Current Facility-Administered Medications  Medication Dose Route Frequency Provider Last Rate Last Dose  . benztropine (COGENTIN) tablet 0.5 mg  0.5 mg Oral BID Rankin, Shuvon B, NP   0.5 mg at 04/08/17 0752  . diltiazem (DILACOR XR) 24 hr capsule 120 mg  120 mg Oral Daily Rankin, Shuvon B, NP   120 mg at 04/08/17 0752  . divalproex (DEPAKOTE) DR tablet 750 mg  750 mg Oral QHS Rankin, Shuvon B, NP   750 mg at 04/07/17 2136  . haloperidol (HALDOL) tablet 10 mg  10 mg Oral BID Ursula Alert, MD   10 mg at 04/08/17 0752  . [START ON 05/06/2017] haloperidol decanoate (HALDOL DECANOATE) 100 MG/ML injection 150 mg  150 mg Intramuscular Q30 days Eappen, Ria Clock, MD      . ibuprofen (ADVIL,MOTRIN) tablet 200-400 mg  200-400 mg Oral Q4H PRN Rankin, Shuvon B, NP   400 mg at 04/04/17 1958  . loperamide (IMODIUM)  capsule 2 mg  2 mg Oral PRN Lindon Romp A, NP      . magnesium hydroxide (MILK OF MAGNESIA) suspension 30 mL  30 mL Oral Daily PRN Rankin, Shuvon B, NP      . nicotine (NICODERM CQ - dosed in mg/24 hr) patch 7 mg  7 mg Transdermal Daily Patriciaann Clan E, PA-C   7 mg at 04/08/17 0753  . OLANZapine zydis (ZYPREXA) disintegrating tablet 5 mg  5 mg Oral BID PRN Ursula Alert, MD       Or  . OLANZapine (ZYPREXA) injection 5 mg  5 mg Intramuscular BID PRN Eappen, Saramma, MD      . ondansetron (ZOFRAN) tablet 4 mg  4 mg Oral Q8H PRN Rankin, Shuvon B, NP      . pantoprazole (PROTONIX) EC tablet 40 mg  40 mg Oral Daily Rankin, Shuvon B, NP   40 mg at 04/08/17 0756  . zolpidem (AMBIEN) tablet 5 mg  5 mg Oral  QHS PRN Cobos, Myer Peer, MD   5 mg at 04/07/17 2136    Lab Results:  No results found for this or any previous visit (from the past 48 hour(s)).  Blood Alcohol level:  Lab Results  Component Value Date   ETH <5 03/31/2017   ETH <5 97/41/6384    Metabolic Disorder Labs: Lab Results  Component Value Date   HGBA1C 4.6 (L) 04/05/2017   MPG 85.32 04/05/2017   Lab Results  Component Value Date   PROLACTIN 50.6 (H) 04/05/2017   Lab Results  Component Value Date   CHOL 130 04/05/2017   TRIG 51 04/05/2017   HDL 57 04/05/2017   CHOLHDL 2.3 04/05/2017   VLDL 10 04/05/2017   LDLCALC 63 04/05/2017   LDLCALC 89 05/03/2012    Physical Findings: AIMS: Facial and Oral Movements Muscles of Facial Expression: None, normal Lips and Perioral Area: None, normal Jaw: None, normal Tongue: None, normal,Extremity Movements Upper (arms, wrists, hands, fingers): None, normal Lower (legs, knees, ankles, toes): None, normal, Trunk Movements Neck, shoulders, hips: None, normal, Overall Severity Severity of abnormal movements (highest score from questions above): None, normal Incapacitation due to abnormal movements: None, normal Patient's awareness of abnormal movements (rate only patient's report): No Awareness, Dental Status Current problems with teeth and/or dentures?: No Does patient usually wear dentures?: No  CIWA:  CIWA-Ar Total: 1 COWS:  COWS Total Score: 2  Musculoskeletal: Strength & Muscle Tone: within normal limits Gait & Station: normal Patient leans: N/A  Psychiatric Specialty Exam: Physical Exam  Nursing note and vitals reviewed. Constitutional: She appears well-developed.    Review of Systems  Psychiatric/Behavioral: Positive for depression and hallucinations. The patient is nervous/anxious (improving ) and has insomnia.   All other systems reviewed and are negative.   Blood pressure (!) 117/44, pulse 91, temperature 98.7 F (37.1 C), temperature source Oral,  resp. rate 16, height 5\' 6"  (1.676 m), weight 81.6 kg (179 lb 14.3 oz), SpO2 98 %.Body mass index is 29.04 kg/m.  General Appearance: Casual, pleasant  and tearful   Eye Contact:  Fair  Speech:  Normal Rate   Volume:  Normal  Mood:  Anxious and Depressed  Affect:  Labile  Thought Process:  Disorganized and Irrelevant- improving   Orientation:  Other:  self  Thought Content:  Delusions, Hallucinations: Auditory, Ideas of Reference:   Paranoia Delusions, Paranoid Ideation, Rumination and Tangential  Suicidal Thoughts:  No  Homicidal Thoughts:  No  Memory:  Immediate;  unable to assess Recent;   Poor Remote;   Poor  Judgement:  Poor  Insight:  Shallow  Psychomotor Activity:  Restlessness  Concentration:  Concentration: Poor and Attention Span: Poor  Recall:  Poor  Fund of Knowledge:  Poor  Language:  Fair  Akathisia:  No  Handed:  Right  AIMS (if indicated):     Assets:  Desire for Improvement Resilience Social Support  ADL's:  Intact  Cognition:  Impaired,  Mild  Sleep:  Number of Hours: 5.75     I agree with current treatment plan on 04/08/2017, Patient seen face-to-face for psychiatric evaluation follow-up, chart reviewed. Reviewed the information documented and agree with the treatment plan.  Treatment Plan Summary: Daily contact with patient to assess and evaluate symptoms and progress in treatment and Medication management    Will continue today 04/08/17 plan as below except where it is noted.  Continue Depakote ER 750 mg po qhs for mood sx. Depakote level therapeutic on admission. Continue Haldol to 10 mg po bid for psychosis. Haldol decanoate IM 150 mg - received 04/05/2017 - repeat q30 days. Discontinued unit restriction  Discontinue Ativan - unknown if this is causing cognitive issues. Will continue Ambien 5 mg po qhs for insomnia. Order UA /Urine culture- pending  CSW will continue to work on disposition.   Derrill [place], NP 04/08/2017, 2:16 PM  Agree  with NP Progress Note

## 2017-04-08 NOTE — Progress Notes (Signed)
D:  Patient's self inventory sheet, patient sleeps good, no sleep medication given.  Poor appetite, normal energy level, good concentration.  Denied depression, hopeless and anxiety.  Denied withdrawals.  Denied SI.  Denied physical problems.  Denied physical pain.  Goal is discharge, talk to MD.  Plans to follow directions. A:  Medications administered per MD orders.  Emotional support and encouragement given patient. R:  Denied SI and HI, contracts for safety.  Denied A/V hallucinations.  Safety maintained with 15 minute checks.

## 2017-04-09 NOTE — Progress Notes (Signed)
D: Pt A & O to self and place. Visible in hall on initial contact. Presents animated with bright affect. Per pt "no honey, I just want to go home" when writer inquired about SI, HI, AVH and pain. Presents preoccupied with intermittent inappropriate laughter at nursing station and while ambulating in hall. Pt was visited by group home staff this afternoon to determine baseline status. Per group home staff pt has become worse. However, [name] Ball has been more cooperative with unit routines, less demanding about leaving unit to see her daughter / children. Off unit for lunch and returned without issues. Took naps in between groups rather than standing at double doors, crying demanding to leave and being irritable when redirected in comparison to previous days. A: Medications given as per MD's orders with verbal education and effects monitored. Encouragement and support offered to pt. Safety checks maintained at Q 15 minutes intervals without self harm gestures thus far.  R: Pt receptive to care. Attended groups this shift, though she left early per therapist. Remains safe on and off unit.

## 2017-04-09 NOTE — Tx Team (Signed)
Interdisciplinary Treatment and Diagnostic Plan Update  04/09/2017 Time of Session: 2:01 PM  [name] Ball MRN: [id number]  Principal Diagnosis: Bipolar affective disorder, current episode, mania with psychotic features. Secondary Diagnoses: Principal Problem:   Bipolar disorder, current episode manic, severe with psychotic features ([place])   Current Medications:  Current Facility-Administered Medications  Medication Dose Route Frequency Provider Last Rate Last Dose  . benztropine (COGENTIN) tablet 0.5 mg  0.5 mg Oral BID Rankin, Shuvon B, NP   0.5 mg at 04/09/17 0809  . diltiazem (DILACOR XR) 24 hr capsule 120 mg  120 mg Oral Daily Rankin, Shuvon B, NP   120 mg at 04/09/17 0809  . divalproex (DEPAKOTE) DR tablet 750 mg  750 mg Oral QHS Rankin, Shuvon B, NP   750 mg at 04/08/17 2204  . haloperidol (HALDOL) tablet 10 mg  10 mg Oral BID Ursula Alert, MD   10 mg at 04/09/17 0809  . [START ON 05/06/2017] haloperidol decanoate (HALDOL DECANOATE) 100 MG/ML injection 150 mg  150 mg Intramuscular Q30 days Eappen, Ria Clock, MD      . ibuprofen (ADVIL,MOTRIN) tablet 200-400 mg  200-400 mg Oral Q4H PRN Rankin, Shuvon B, NP   400 mg at 04/04/17 1958  . loperamide (IMODIUM) capsule 2 mg  2 mg Oral PRN Lindon Romp A, NP      . magnesium hydroxide (MILK OF MAGNESIA) suspension 30 mL  30 mL Oral Daily PRN Rankin, Shuvon B, NP      . nicotine (NICODERM CQ - dosed in mg/24 hr) patch 7 mg  7 mg Transdermal Daily Patriciaann Clan E, PA-C   7 mg at 04/09/17 0809  . OLANZapine zydis (ZYPREXA) disintegrating tablet 5 mg  5 mg Oral BID PRN Ursula Alert, MD       Or  . OLANZapine (ZYPREXA) injection 5 mg  5 mg Intramuscular BID PRN Eappen, Saramma, MD      . ondansetron (ZOFRAN) tablet 4 mg  4 mg Oral Q8H PRN Rankin, Shuvon B, NP      . pantoprazole (PROTONIX) EC tablet 40 mg  40 mg Oral Daily Rankin, Shuvon B, NP   40 mg at 04/09/17 0809  . zolpidem (AMBIEN) tablet 5 mg  5 mg Oral QHS PRN Cobos, Myer Peer, MD    5 mg at 04/08/17 2204    PTA Medications: Prescriptions Prior to Admission  Medication Sig Dispense Refill Last Dose  . benztropine (COGENTIN) 0.5 MG tablet Take 0.5 mg by mouth 2 (two) times daily.   unknown  . diltiazem (DILACOR XR) 120 MG 24 hr capsule Take 120 mg by mouth daily.   unknown  . divalproex (DEPAKOTE) 250 MG DR tablet Take 750 mg by mouth at bedtime.    unknown  . haloperidol (HALDOL) 10 MG tablet Take 10 mg by mouth 3 (three) times daily.   unknown  . haloperidol decanoate (HALDOL DECANOATE) 100 MG/ML injection Inject 150 mg into the muscle every 28 (twenty-eight) days.    unknown  . ibuprofen (ADVIL,MOTRIN) 200 MG tablet Take 200-400 mg by mouth every 4 (four) hours as needed for moderate pain.    unknown  . LORazepam (ATIVAN) 1 MG tablet Take 1 mg by mouth every 6 (six) hours as needed for anxiety.   unknown  . pantoprazole (PROTONIX) 20 MG tablet Take 20 mg by mouth daily.   unknown  . QUEtiapine (SEROQUEL) 50 MG tablet Take 50 mg by mouth at bedtime.   unknown  . traZODone (DESYREL) 100 MG  tablet Take 100 mg by mouth at bedtime as needed for sleep.   unknown  . zolpidem (AMBIEN) 10 MG tablet Take 10 mg by mouth at bedtime.   unknown    Treatment Modalities: Medication Management, Group therapy, Case management,  1 to 1 session with clinician, Psychoeducation, Recreational therapy.  Patient Stressors: Medication change or noncompliance  Patient Strengths: Ability for insight Communication skills   Physician Treatment Plan for Primary Diagnosis: Bipolar affective disorder, current episode, mania with psychotic features. Long Term Goal(s): Improvement in symptoms so as ready for discharge  Short Term Goals: Ability to identify changes in lifestyle to reduce recurrence of condition will improve Ability to verbalize feelings will improve Ability to identify changes in lifestyle to reduce recurrence of condition will improve Ability to verbalize feelings will  improve Ability to demonstrate self-control will improve  Medication Management: Evaluate patient's response, side effects, and tolerance of medication regimen.  Therapeutic Interventions: 1 to 1 sessions, Unit Group sessions and Medication administration.  Evaluation of Outcomes: Adequate for Discharge  Physician Treatment Plan for Secondary Diagnosis: Principal Problem:   Bipolar disorder, current episode manic, severe with psychotic features ([place])  Long Term Goal(s): Improvement in symptoms so as ready for discharge  Short Term Goals: Ability to identify changes in lifestyle to reduce recurrence of condition will improve Ability to verbalize feelings will improve Ability to identify changes in lifestyle to reduce recurrence of condition will improve Ability to verbalize feelings will improve Ability to demonstrate self-control will improve  Medication Management: Evaluate patient's response, side effects, and tolerance of medication regimen.  Therapeutic Interventions: 1 to 1 sessions, Unit Group sessions and Medication administration.  Evaluation of Outcomes: Adequate for Discharge   RN Treatment Plan for Primary Diagnosis: Bipolar affective disorder, current episode, mania with psychotic features. Long Term Goal(s): Knowledge of disease and therapeutic regimen to maintain health will improve  Short Term Goals: Ability to remain free from injury will improve, Ability to disclose and discuss suicidal ideas, Ability to identify and develop effective coping behaviors will improve and Compliance with prescribed medications will improve  Medication Management: RN will administer medications as ordered by provider, will assess and evaluate patient's response and provide education to patient for prescribed medication. RN will report any adverse and/or side effects to prescribing provider.  Therapeutic Interventions: 1 on 1 counseling sessions, Psychoeducation, Medication  administration, Evaluate responses to treatment, Monitor vital signs and CBGs as ordered, Perform/monitor CIWA, COWS, AIMS and Fall Risk screenings as ordered, Perform wound care treatments as ordered.  Evaluation of Outcomes: Adequate for Discharge   LCSW Treatment Plan for Primary Diagnosis: Bipolar affective disorder, current episode, mania with psychotic features. Long Term Goal(s): Safe transition to appropriate next level of care at discharge, Engage patient in therapeutic group addressing interpersonal concerns.  Short Term Goals: Engage patient in aftercare planning with referrals and resources, Facilitate acceptance of mental health diagnosis and concerns, Identify triggers associated with mental health/substance abuse issues and Increase skills for wellness and recovery  Therapeutic Interventions: Assess for all discharge needs, 1 to 1 time with Social worker, Explore available resources and support systems, Assess for adequacy in community support network, Educate family and significant other(s) on suicide prevention, Complete Psychosocial Assessment, Interpersonal group therapy.  Evaluation of Outcomes: Adequate for Discharge   Progress in Treatment: Attending groups: Yes Participating in groups: Yes Taking medication as prescribed: Yes Toleration of medication: Yes, no side effects reported at this time Family/Significant other contact made: Symphanie Boyce [phone],  guardian  Patient understands diagnosis: No, limited insight  Discussing patient identified problems/goals with staff: Yes Medical problems stabilized or resolved: Yes Denies suicidal/homicidal ideation: Yes Issues/concerns per patient self-inventory: None Other: N/A  New problem(s) identified: None identified at this time.   New Short Term/Long Term Goal(s):  Guardian, Symphanie Uvaldo Rising, wants [name] Ball to receive help with "Medication stabilization".   Discharge Plan or Barriers: Pt will return to  [facility] , follow up [facility] ACT team  Reason for Continuation of Hospitalization: Medication stabilization    Estimated Length of Stay:[facility] d/c tomorrow, thrusday at the latest  Attendees: Patient:  04/09/2017  2:01 PM  Physician: Neita Garnet, MD 04/09/2017  2:01 PM  Nursing: Jonette Mate, RN 04/09/2017  2:01 PM  RN Care Manager: Lars Pinks, RN 04/09/2017  2:01 PM  Social Worker: Ripley Fraise, LCSW; Verdis Frederickson, Social Work Intern 04/09/2017  2:01 PM  Recreational Therapist: Victorino Sparrow, LRT 04/09/2017  2:01 PM  Other: Norberto Sorenson, [place] 04/09/2017  2:01 PM  Other:  04/09/2017  2:01 PM  Other: 04/09/2017  2:01 PM    Scribe for Treatment Team: Trish Mage, LCSW 04/09/2017 2:01 PM

## 2017-04-09 NOTE — Progress Notes (Signed)
Adult Psychoeducational Group Note  Date:  04/09/2017 Time:  8:53 PM  Group Topic/Focus:  Wrap-Up Group:   The focus of this group is to help patients review their daily goal of treatment and discuss progress on daily workbooks.  Participation Level:  Active  Participation Quality:  Appropriate  Affect:  Appropriate  Cognitive:  Appropriate  Insight: Appropriate  Engagement in Group:  Engaged  Modes of Intervention:  Discussion  Additional Comments: The patient expressed that she attended groups.The patient also said that  She rates today a 10.  Nash Shearer 04/09/2017, 8:53 PM

## 2017-04-09 NOTE — Progress Notes (Signed)
Recreation Therapy Notes  Date: 04/09/17 Time: 1000 Location: 500 Dayroom  Group Topic: Wellness  Goal Area(s) Addresses:  Patient will define components of whole wellness. Patient will verbalize benefit of whole wellness.  Behavioral Response: None  Intervention:  2 Decks of Cards   Activity: Deck of Chance.  LRT had 2 decks of cards.  Each patient was given 2 cards from one deck of playing cards.  LRT would pull a card from a separate deck of cards.  If a patients number was pulled, they had to perform the exercise that goes with that card.  Education: Wellness, Dentist.   Education Outcome: Acknowledges education/In group clarification offered/Needs additional education.   Clinical Observations/Feedback:  Pt spent most of her time looking out the window.  Pt left group and did not return to group.    Victorino Sparrow, LRT/CTRS       Victorino Sparrow A 04/09/2017 12:18 PM

## 2017-04-09 NOTE — [facility] Group Notes (Signed)
[facility] LCSW Group Therapy 04/09/2017 1:15pm  Type of Therapy: Group Therapy- Feelings Around Discharge & Establishing a Supportive Framework  Participation Level:  Minimal  Description of Group:   What is a supportive framework? What does it look like feel like and how do I discern it from and unhealthy non-supportive network? Learn how to cope when supports are not helpful and don't support you. Discuss what to do when your family/friends are not supportive.  Summary of Patient Progress  Stayed the entire time, appeared engaged throughout.  "I graduated from [facility].  It was a community where people supported each other.  I was sad when it was time for me to go."   Therapeutic Modalities:   Cognitive [place], [place] 04/09/2017 4:19 PM

## 2017-04-09 NOTE — Progress Notes (Signed)
D   Pt spent most of her time in her room in bed this evening   She did come to get her medications after being prompted and took them without problem A   Verbal support and encouragement given    Medications administered and effectiveness monitored   Monitor Q 15 min for safety R   Pt remains safe

## 2017-04-09 NOTE — Progress Notes (Signed)
[facility] MD Progress Note  04/09/2017 11:31 AM [name] Ball  MRN:  [id number]    Subjective: [name] Ball reports " I am okay today"  Objective:[name] Ball seen standing at nursing station. Patient presents with smiling with a brighter affect today. Patient reports she feels ready to go outside. Per staff patient is taken medications without medication side effects. Pending follow-up visit from patient ACT. Will consider discharging back to group/care home.  Support, encouragement and reassurance was provided.   Principal Problem: Bipolar disorder, current episode manic, severe with psychotic features ([place]) Diagnosis:   Patient Active Problem List   Diagnosis Date Noted  . Bipolar disorder, current episode manic, severe with psychotic features ([place]) [F31.2] 04/03/2017  . Hepatitis C virus infection without hepatic coma [B19.20]   . Bipolar disorder ([place]) [F31.9]   . GERD (gastroesophageal reflux disease) [K21.9]    Total Time spent with patient: 25 minutes  Past Psychiatric History: Please see H&P.   Past Medical History:  Past Medical History:  Diagnosis Date  . Bipolar disorder ([place])   . GERD (gastroesophageal reflux disease)   . Hepatitis C virus infection without hepatic coma     Past Surgical History:  Procedure Laterality Date  . abdominal cyst removed    . CESAREAN SECTION    . COLON SURGERY     Family History: Please see H&P.  Family Psychiatric  History: Please see H&P.  Social History: Please see H&P.  History  Alcohol Use No     History  Drug Use No    Social History   Social History  . Marital status: Unknown    Spouse name: N/A  . Number of children: N/A  . Years of education: N/A   Social History Main Topics  . Smoking status: Current Every Day Smoker  . Smokeless tobacco: Never Used  . Alcohol use No  . Drug use: No  . Sexual activity: Not Asked   Other Topics Concern  . None   Social History Narrative  . None   Additional Social  History:    Pain Medications: See MAR Prescriptions: See MAR Over the Counter: See MAR History of alcohol / drug use?: No history of alcohol / drug abuse Longest period of sobriety (when/how long): NA                    Sleep: Fair  Appetite:  Fair  Current Medications: Current Facility-Administered Medications  Medication Dose Route Frequency Provider Last Rate Last Dose  . benztropine (COGENTIN) tablet 0.5 mg  0.5 mg Oral BID Rankin, Shuvon B, NP   0.5 mg at 04/09/17 0809  . diltiazem (DILACOR XR) 24 hr capsule 120 mg  120 mg Oral Daily Rankin, Shuvon B, NP   120 mg at 04/09/17 0809  . divalproex (DEPAKOTE) DR tablet 750 mg  750 mg Oral QHS Rankin, Shuvon B, NP   750 mg at 04/08/17 2204  . haloperidol (HALDOL) tablet 10 mg  10 mg Oral BID Ursula Alert, MD   10 mg at 04/09/17 0809  . [START ON 05/06/2017] haloperidol decanoate (HALDOL DECANOATE) 100 MG/ML injection 150 mg  150 mg Intramuscular Q30 days Eappen, Ria Clock, MD      . ibuprofen (ADVIL,MOTRIN) tablet 200-400 mg  200-400 mg Oral Q4H PRN Rankin, Shuvon B, NP   400 mg at 04/04/17 1958  . loperamide (IMODIUM) capsule 2 mg  2 mg Oral PRN Lindon Romp A, NP      . magnesium hydroxide (MILK OF  MAGNESIA) suspension 30 mL  30 mL Oral Daily PRN Rankin, Shuvon B, NP      . nicotine (NICODERM CQ - dosed in mg/24 hr) patch 7 mg  7 mg Transdermal Daily Patriciaann Clan E, PA-C   7 mg at 04/09/17 0809  . OLANZapine zydis (ZYPREXA) disintegrating tablet 5 mg  5 mg Oral BID PRN Ursula Alert, MD       Or  . OLANZapine (ZYPREXA) injection 5 mg  5 mg Intramuscular BID PRN Eappen, Saramma, MD      . ondansetron (ZOFRAN) tablet 4 mg  4 mg Oral Q8H PRN Rankin, Shuvon B, NP      . pantoprazole (PROTONIX) EC tablet 40 mg  40 mg Oral Daily Rankin, Shuvon B, NP   40 mg at 04/09/17 0809  . zolpidem (AMBIEN) tablet 5 mg  5 mg Oral QHS PRN Cobos, Myer Peer, MD   5 mg at 04/08/17 2204    Lab Results:  No results found for this or any  previous visit (from the past 48 hour(s)).  Blood Alcohol level:  Lab Results  Component Value Date   ETH <5 03/31/2017   ETH <5 92/05/9416    Metabolic Disorder Labs: Lab Results  Component Value Date   HGBA1C 4.6 (L) 04/05/2017   MPG 85.32 04/05/2017   Lab Results  Component Value Date   PROLACTIN 50.6 (H) 04/05/2017   Lab Results  Component Value Date   CHOL 130 04/05/2017   TRIG 51 04/05/2017   HDL 57 04/05/2017   CHOLHDL 2.3 04/05/2017   VLDL 10 04/05/2017   LDLCALC 63 04/05/2017   LDLCALC 89 05/03/2012    Physical Findings: AIMS: Facial and Oral Movements Muscles of Facial Expression: None, normal Lips and Perioral Area: None, normal Jaw: None, normal Tongue: None, normal,Extremity Movements Upper (arms, wrists, hands, fingers): None, normal Lower (legs, knees, ankles, toes): None, normal, Trunk Movements Neck, shoulders, hips: None, normal, Overall Severity Severity of abnormal movements (highest score from questions above): None, normal Incapacitation due to abnormal movements: None, normal Patient's awareness of abnormal movements (rate only patient's report): No Awareness, Dental Status Current problems with teeth and/or dentures?: No Does patient usually wear dentures?: No  CIWA:  CIWA-Ar Total: 1 COWS:  COWS Total Score: 2  Musculoskeletal: Strength & Muscle Tone: within normal limits Gait & Station: normal Patient leans: N/A  Psychiatric Specialty Exam: Physical Exam  Nursing note and vitals reviewed. Constitutional: She appears well-developed.  Neurological: She is alert.  Psychiatric: She has a normal mood and affect.    Review of Systems  Psychiatric/Behavioral: Positive for depression and hallucinations. The patient is nervous/anxious (improving ) and has insomnia.   All other systems reviewed and are negative.   Blood pressure 113/86, pulse 95, temperature 98.4 F (36.9 C), temperature source Oral, resp. rate 20, height 5\' 6"  (1.676  m), weight 81.6 kg (179 lb 14.3 oz), SpO2 98 %.Body mass index is 29.04 kg/m.  General Appearance: Casual, pleasant, smiling and cooperative    Eye Contact:  Fair  Speech:  Normal Rate   Volume:  Normal  Mood:  Anxious and Depressed  Affect:  Labile  Thought Process:  Disorganized and Irrelevant- improving   Orientation:  Other:  self  Thought Content:  Delusions, Hallucinations: Auditory, Ideas of Reference:   Paranoia Delusions, Paranoid Ideation, Rumination and Tangential  Suicidal Thoughts:  No  Homicidal Thoughts:  No  Memory:  Immediate;   unable to assess Recent;   Poor Remote;  Poor  Judgement:  Poor  Insight:  Shallow  Psychomotor Activity:  Restlessness  Concentration:  Concentration: Poor and Attention Span: Poor  Recall:  Poor  Fund of Knowledge:  Poor  Language:  Fair  Akathisia:  No  Handed:  Right  AIMS (if indicated):     Assets:  Desire for Improvement Resilience Social Support  ADL's:  Intact  Cognition:  Impaired,  Mild  Sleep:  Number of Hours: 4.75     I agree with current treatment plan on 04/09/2017, Patient seen face-to-face for psychiatric evaluation follow-up, chart reviewed. Reviewed the information documented and agree with the treatment plan.  Treatment Plan Summary: Daily contact with patient to assess and evaluate symptoms and progress in treatment and Medication management    Will continue today 04/09/17 plan as below except where it is noted.  Continue Depakote ER 750 mg po qhs for mood sx. Depakote level therapeutic on admission. Continue Haldol to 10 mg po bid for psychosis. Haldol decanoate IM 150 mg - received 04/05/2017 - repeat q30 days. Discontinued unit restriction on 04/08/2017 Discontinue Ativan - unknown if this is causing cognitive issues. Will continue Ambien 5 mg po qhs for insomnia. Order UA /Urine culture- pending  CSW will continue to work on disposition.   Derrill [place], NP 04/09/2017, 11:31 AM

## 2017-04-10 NOTE — Progress Notes (Signed)
Adult Psychoeducational Group Note  Date:  04/10/2017 Time:  9:08 PM  Group Topic/Focus:  Wrap-Up Group:   The focus of this group is to help patients review their daily goal of treatment and discuss progress on daily workbooks.  Participation Level:  Minimal  Participation Quality:  Monopolizing  Affect:  Flat  Cognitive:  Confused  Insight: Lacking  Engagement in Group:  Lacking  Modes of Intervention:  Discussion  Additional Comments:  The patient expressed that she rates today a 9.  Nash Shearer 04/10/2017, 9:08 PM

## 2017-04-10 NOTE — Plan of Care (Signed)
Problem: Education: Goal: Will be free of psychotic symptoms Outcome: Progressing Nurse discussed depression/anxiety/coping skills with patient.   

## 2017-04-10 NOTE — Progress Notes (Signed)
[facility] MD Progress Note  04/10/2017 4:33 PM [name] Ball  MRN:  [id number]    Subjective: [name] Ball reports " I good, I just need some coffee. Do you know when I can go home?"  Objective:[name] Ball seen resting in dayroom interacting with staff. [name] Ball is awake and alert, smiling, cooperative and pleasant. Still pending [facility] assessment on 04/09/2017 CSW to follow-up. Reports she is resting well with a good appetite. Patient continues to be tangential and disorganized  however improvement noted.  Patient to be considered for discharge  back to group/care home.  Support, encouragement and reassurance was provided.   Principal Problem: Bipolar disorder, current episode manic, severe with psychotic features ([place]) Diagnosis:   Patient Active Problem List   Diagnosis Date Noted  . Bipolar disorder, current episode manic, severe with psychotic features ([place]) [F31.2] 04/03/2017  . Hepatitis C virus infection without hepatic coma [B19.20]   . Bipolar disorder ([place]) [F31.9]   . GERD (gastroesophageal reflux disease) [K21.9]    Total Time spent with patient: 25 minutes  Past Psychiatric History: Please see H&P.   Past Medical History:  Past Medical History:  Diagnosis Date  . Bipolar disorder ([place])   . GERD (gastroesophageal reflux disease)   . Hepatitis C virus infection without hepatic coma     Past Surgical History:  Procedure Laterality Date  . abdominal cyst removed    . CESAREAN SECTION    . COLON SURGERY     Family History: Please see H&P.  Family Psychiatric  History: Please see H&P.  Social History: Please see H&P.  History  Alcohol Use No     History  Drug Use No    Social History   Social History  . Marital status: Unknown    Spouse name: N/A  . Number of children: N/A  . Years of education: N/A   Social History Main Topics  . Smoking status: Current Every Day Smoker  . Smokeless tobacco: Never Used  . Alcohol use No  . Drug use: No  . Sexual activity:  Not Asked   Other Topics Concern  . None   Social History Narrative  . None   Additional Social History:    Pain Medications: See MAR Prescriptions: See MAR Over the Counter: See MAR History of alcohol / drug use?: No history of alcohol / drug abuse Longest period of sobriety (when/how long): NA                    Sleep: Fair  Appetite:  Fair  Current Medications: Current Facility-Administered Medications  Medication Dose Route Frequency Provider Last Rate Last Dose  . benztropine (COGENTIN) tablet 0.5 mg  0.5 mg Oral BID Rankin, Shuvon B, NP   0.5 mg at 04/10/17 0736  . diltiazem (DILACOR XR) 24 hr capsule 120 mg  120 mg Oral Daily Rankin, Shuvon B, NP   120 mg at 04/10/17 0736  . divalproex (DEPAKOTE) DR tablet 750 mg  750 mg Oral QHS Rankin, Shuvon B, NP   750 mg at 04/09/17 2115  . haloperidol (HALDOL) tablet 10 mg  10 mg Oral BID Ursula Alert, MD   10 mg at 04/10/17 0736  . [START ON 05/06/2017] haloperidol decanoate (HALDOL DECANOATE) 100 MG/ML injection 150 mg  150 mg Intramuscular Q30 days Eappen, Ria Clock, MD      . ibuprofen (ADVIL,MOTRIN) tablet 200-400 mg  200-400 mg Oral Q4H PRN Rankin, Shuvon B, NP   400 mg at 04/04/17 1958  . loperamide (  IMODIUM) capsule 2 mg  2 mg Oral PRN Lindon Romp A, NP      . magnesium hydroxide (MILK OF MAGNESIA) suspension 30 mL  30 mL Oral Daily PRN Rankin, Shuvon B, NP      . nicotine (NICODERM CQ - dosed in mg/24 hr) patch 7 mg  7 mg Transdermal Daily Patriciaann Clan E, PA-C   7 mg at 04/10/17 0737  . OLANZapine zydis (ZYPREXA) disintegrating tablet 5 mg  5 mg Oral BID PRN Ursula Alert, MD       Or  . OLANZapine (ZYPREXA) injection 5 mg  5 mg Intramuscular BID PRN Eappen, Saramma, MD      . ondansetron (ZOFRAN) tablet 4 mg  4 mg Oral Q8H PRN Rankin, Shuvon B, NP      . pantoprazole (PROTONIX) EC tablet 40 mg  40 mg Oral Daily Rankin, Shuvon B, NP   40 mg at 04/10/17 0737  . zolpidem (AMBIEN) tablet 5 mg  5 mg Oral QHS PRN  Cobos, Myer Peer, MD   5 mg at 04/09/17 2115    Lab Results:  No results found for this or any previous visit (from the past 48 hour(s)).  Blood Alcohol level:  Lab Results  Component Value Date   ETH <5 03/31/2017   ETH <5 04/25/8526    Metabolic Disorder Labs: Lab Results  Component Value Date   HGBA1C 4.6 (L) 04/05/2017   MPG 85.32 04/05/2017   Lab Results  Component Value Date   PROLACTIN 50.6 (H) 04/05/2017   Lab Results  Component Value Date   CHOL 130 04/05/2017   TRIG 51 04/05/2017   HDL 57 04/05/2017   CHOLHDL 2.3 04/05/2017   VLDL 10 04/05/2017   LDLCALC 63 04/05/2017   LDLCALC 89 05/03/2012    Physical Findings: AIMS: Facial and Oral Movements Muscles of Facial Expression: None, normal Lips and Perioral Area: None, normal Jaw: None, normal Tongue: None, normal,Extremity Movements Upper (arms, wrists, hands, fingers): None, normal Lower (legs, knees, ankles, toes): None, normal, Trunk Movements Neck, shoulders, hips: None, normal, Overall Severity Severity of abnormal movements (highest score from questions above): None, normal Incapacitation due to abnormal movements: None, normal Patient's awareness of abnormal movements (rate only patient's report): No Awareness, Dental Status Current problems with teeth and/or dentures?: No Does patient usually wear dentures?: No  CIWA:  CIWA-Ar Total: 1 COWS:  COWS Total Score: 2  Musculoskeletal: Strength & Muscle Tone: within normal limits Gait & Station: normal Patient leans: N/A  Psychiatric Specialty Exam: Physical Exam  Nursing note and vitals reviewed. Constitutional: She appears well-developed.  Neurological: She is alert.  Psychiatric: She has a normal mood and affect.    Review of Systems  Psychiatric/Behavioral: Positive for depression and hallucinations. The patient is nervous/anxious (improving ) and has insomnia.   All other systems reviewed and are negative.   Blood pressure 103/72,  pulse 95, temperature 98.9 F (37.2 C), temperature source Oral, resp. rate 18, height 5\' 6"  (1.676 m), weight 81.6 kg (179 lb 14.3 oz), SpO2 98 %.Body mass index is 29.04 kg/m.  General Appearance: Casual, pleasant, smiling and cooperative    Eye Contact:  Fair  Speech:  Normal Rate   Volume:  Normal  Mood:  Anxious and Depressed  Affect:  Labile  Thought Process:  Disorganized and Irrelevant- improving   Orientation:  Other:  self  Thought Content:  Delusions, Hallucinations: Auditory, Ideas of Reference:   Paranoia Delusions, Paranoid Ideation, Rumination and Tangential  Suicidal Thoughts:  No  Homicidal Thoughts:  No  Memory:  Immediate;   unable to assess Recent;   Poor Remote;   Poor  Judgement:  Poor  Insight:  Shallow  Psychomotor Activity:  Normal patient is visible on the unit   Concentration:  Concentration: Poor and Attention Span: Poor  Recall:  Poor  Fund of Knowledge:  Poor  Language:  Fair  Akathisia:  No  Handed:  Right  AIMS (if indicated):     Assets:  Desire for Improvement Resilience Social Support  ADL's:  Intact  Cognition:  Impaired,  Mild  Sleep:  Number of Hours: 6     I agree with current treatment plan on 04/10/2017, Patient seen face-to-face for psychiatric evaluation follow-up, chart reviewed. Reviewed the information documented and agree with the treatment plan.  Treatment Plan Summary: Daily contact with patient to assess and evaluate symptoms and progress in treatment and Medication management    Will continue today 04/10/17 plan as below except where it is noted.  Continue Depakote ER 750 mg po qhs for mood sx. Depakote level therapeutic on admission. Continue Haldol to 10 mg po bid for psychosis. Haldol decanoate IM 150 mg - received 04/05/2017 - repeat q30 days. Discontinued unit restriction on 04/08/2017 Discontinue Ativan - unknown if this is causing cognitive issues. Will continue Ambien 5 mg po qhs for insomnia. Order UA /Urine  culture- pending  CSW will continue to work on disposition.   Derrill Center, NP 04/10/2017, 4:33 PM  Agree with NP Progress Note
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
## 2017-04-10 NOTE — Progress Notes (Signed)
Recreation Therapy Notes  Date: 04/10/17 Time: 1000 Location: 500 Hall Dayroom  Group Topic: Self-Esteem  Goal Area(s) Addresses:  Patient will successfully identify positive attributes about themselves.  Patient will successfully identify benefit of improved self-esteem.  Intervention: Pencils, paper  Activity: Creative Writing.  Patients were divided into two groups of 4.  Each person was to identify the positive traits they posses.  The group was to then create a story based on a superhero the created using the qualities they identified in themselves.  They were to include in the story how the superhero would use their positive traits to help others overcome their struggles such as depression, anxiety, addiction, etc.  Education:  Self-Esteem, Discharge Planning.   Education Outcome: Acknowledges education/In group clarification offered/Needs additional education  Clinical Observations/Feedback: Pt did not attend group.   Victorino Sparrow, LRT/CTRS         Victorino Sparrow A 04/10/2017 12:53 PM

## 2017-04-10 NOTE — BHH Group Notes (Signed)
LCSW Group Therapy Note  04/10/2017 1:15pm  Type of Therapy/Topic:  Group Therapy:  Balance in Life  Participation Level:  None  Description of Group:    This group will address the concept of balance and how it feels and looks when one is unbalanced. Patients will be encouraged to process areas in their lives that are out of balance and identify reasons for remaining unbalanced. Facilitators will guide patients in utilizing problem-solving interventions to address and correct the stressor making their life unbalanced. Understanding and applying boundaries will be explored and addressed for obtaining and maintaining a balanced life. Patients will be encouraged to explore ways to assertively make their unbalanced needs known to significant others in their lives, using other group members and facilitator for support and feedback.  Therapeutic Goals: 1. Patient will identify two or more emotions or situations they have that consume much of in their lives. 2. Patient will identify signs/triggers that life has become out of balance:  3. Patient will identify two ways to set boundaries in order to achieve balance in their lives:  4. Patient will demonstrate ability to communicate their needs through discussion and/or role plays  Summary of Patient Progress:   [name] Ball attended group but she did not say anything.  She was in and out of the room. She was often looking out the window.    Therapeutic Modalities:   Cognitive Behavioral Therapy Solution-Focused Therapy Assertiveness Training  Darleen Crocker, [place] Work 04/10/2017 2:49 PM

## 2017-04-10 NOTE — Progress Notes (Signed)
D:  Patient's self inventory sheet, patient sleeps good, no sleep medication.  Good appetite, normal energy level, good concentration.  Rated depression and anxiety 5, denied hopeless.  Denied withdrawals.  Denied SI.  Denied physical pain.  Goal is to participate.  Plans to talk to family.  "Thank you."  Does have discharge plans. A:  Medications administered per MD orders.  Emotional support and encouragement given patient. R:  Denied SI and HI, contracts for safety.  Denied A/V hallucinations.  Safety maintained with 15 minute checks. Patient has asked for phone numbers of people that she only knows the first name.

## 2017-04-11 DIAGNOSIS — F29 Unspecified psychosis not due to a substance or known physiological condition: Secondary | ICD-10-CM

## 2017-04-11 NOTE — BHH Group Notes (Signed)
LCSW Group Therapy 04/11/2017 1:15pm  Type of Therapy and Topic:  Group Therapy:  Change and Accountability  Participation Level:  Minimal  Description of Group In this group, patients discussed power and accountability for change.  The group identified the challenges related to accountability and the difficulty of accepting the outcomes of negative behaviors.  Patients were encouraged to openly discuss a challenge/change they could take responsibility for.  Patients discussed the use of "change talk" and positive thinking as ways to support achievement of personal goals.  The group discussed ways to give support and empowerment to peers.  Therapeutic Goals: 1. Patients will state the relationship between personal power and accountability in the change process 2. Patients will identify the positive and negative consequences of a personal choice they have made 3. Patients will identify one challenge/choice they will take responsibility for making 4. Patients will discuss the role of "change talk" and the impact of positive thinking as it supports successful personal change 5. Patients will verbalize support and affirmation of change efforts in peers  Summary of Patient Progress:  Shannara attended group but was in and out of the room.  She came in late and got some coffee.  She then sat it the chair and rocked back and forth for a while.  When she was asked about what she has learned she rambled about various things.  She appeared disorganized and with limited insight.   Therapeutic Modalities Solution Focused Brief Therapy Motivational Interviewing Cognitive Behavioral Therapy  Riley Lam Work 04/11/2017 1:34 PM

## 2017-04-11 NOTE — Progress Notes (Signed)
DAR NOTE: Pt present with anxious affect and depressed mood in the unit. Pt has been interacting well with both staff and peers. Pt denies physical pain, took all her meds as scheduled. As per self inventory, pt had a fair night sleep, fair appetite, normal energy, and good concentration. Pt rate depression at 0, hopeless ness at 0, and anxiety at 0. Pt's safety ensured with 15 minute and environmental checks. Pt currently denies SI/HI and A/V hallucinations. Pt verbally agrees to seek staff if SI/HI or A/VH occurs and to consult with staff before acting on these thoughts. Will continue POC.

## 2017-04-11 NOTE — Progress Notes (Signed)
[facility] MD Progress Note  04/11/2017 2:01 PM [name] Ball  MRN:  [id number]    Subjective: [name] Ball reports "Today, I'm going to the [employer] [place] on [address] & also the [facility] in [place], [place]. I have to be there today. It kaes about 3 days to get there. I need some to call & talk to [employer]. She needs to know that I'm here. Tell her that I need attorney & a witness before I can be picked up from here".  Objective:[name] Ball is seen, chart reviewed. She is resting in dayroom interacting with hersel. She appears to be having a full conversation by herself in the room. She is awake, alert, smiling, cooperative and pleasant. She presents with a reactive affects, delusional, disorganized & tangential. She does appear to be responding to internal stimuli. No changes made on the current plan of care.  Principal Problem: Bipolar disorder, current episode manic, severe with psychotic features ([place]) Diagnosis:   Patient Active Problem List   Diagnosis Date Noted  . Bipolar disorder, current episode manic, severe with psychotic features ([place]) [F31.2] 04/03/2017  . Hepatitis C virus infection without hepatic coma [B19.20]   . Bipolar disorder ([place]) [F31.9]   . GERD (gastroesophageal reflux disease) [K21.9]    Total Time spent with patient: 15 minutes  Past Psychiatric History: Please see H&P.  Past Medical History:  Past Medical History:  Diagnosis Date  . Bipolar disorder ([place])   . GERD (gastroesophageal reflux disease)   . Hepatitis C virus infection without hepatic coma     Past Surgical History:  Procedure Laterality Date  . abdominal cyst removed    . CESAREAN SECTION    . COLON SURGERY     Family History: Please see H&P.  Family Psychiatric  History: Please see H&P.  Social History: Please see H&P.  History  Alcohol Use No     History  Drug Use No    Social History   Social History  . Marital status: Unknown    Spouse name: N/A  . Number of children: N/A  .  Years of education: N/A   Social History Main Topics  . Smoking status: Current Every Day Smoker  . Smokeless tobacco: Never Used  . Alcohol use No  . Drug use: No  . Sexual activity: Not Asked   Other Topics Concern  . None   Social History Narrative  . None   Additional Social History:    Pain Medications: See MAR Prescriptions: See MAR Over the Counter: See MAR History of alcohol / drug use?: No history of alcohol / drug abuse Longest period of sobriety (when/how long): NA  Sleep: Fair  Appetite:  Fair  Current Medications: Current Facility-Administered Medications  Medication Dose Route Frequency Provider Last Rate Last Dose  . benztropine (COGENTIN) tablet 0.5 mg  0.5 mg Oral BID Rankin, Shuvon B, NP   0.5 mg at 04/11/17 0850  . diltiazem (DILACOR XR) 24 hr capsule 120 mg  120 mg Oral Daily Rankin, Shuvon B, NP   120 mg at 04/11/17 0850  . divalproex (DEPAKOTE) DR tablet 750 mg  750 mg Oral QHS Rankin, Shuvon B, NP   750 mg at 04/10/17 2245  . haloperidol (HALDOL) tablet 10 mg  10 mg Oral BID Ursula Alert, MD   10 mg at 04/11/17 0850  . [START ON 05/06/2017] haloperidol decanoate (HALDOL DECANOATE) 100 MG/ML injection 150 mg  150 mg Intramuscular Q30 days Ursula Alert, MD      .  ibuprofen (ADVIL,MOTRIN) tablet 200-400 mg  200-400 mg Oral Q4H PRN Rankin, Shuvon B, NP   400 mg at 04/04/17 1958  . loperamide (IMODIUM) capsule 2 mg  2 mg Oral PRN Lindon Romp A, NP      . magnesium hydroxide (MILK OF MAGNESIA) suspension 30 mL  30 mL Oral Daily PRN Rankin, Shuvon B, NP      . nicotine (NICODERM CQ - dosed in mg/24 hr) patch 7 mg  7 mg Transdermal Daily Patriciaann Clan E, PA-C   7 mg at 04/11/17 0851  . OLANZapine zydis (ZYPREXA) disintegrating tablet 5 mg  5 mg Oral BID PRN Ursula Alert, MD       Or  . OLANZapine (ZYPREXA) injection 5 mg  5 mg Intramuscular BID PRN Eappen, Saramma, MD      . ondansetron (ZOFRAN) tablet 4 mg  4 mg Oral Q8H PRN Rankin, Shuvon B, NP       . pantoprazole (PROTONIX) EC tablet 40 mg  40 mg Oral Daily Rankin, Shuvon B, NP   40 mg at 04/11/17 0850  . zolpidem (AMBIEN) tablet 5 mg  5 mg Oral QHS PRN Cobos, Myer Peer, MD   5 mg at 04/10/17 2245   Lab Results:  No results found for this or any previous visit (from the past 48 hour(s)).  Blood Alcohol level:  Lab Results  Component Value Date   ETH <5 03/31/2017   ETH <5 69/62/9528   Metabolic Disorder Labs: Lab Results  Component Value Date   HGBA1C 4.6 (L) 04/05/2017   MPG 85.32 04/05/2017   Lab Results  Component Value Date   PROLACTIN 50.6 (H) 04/05/2017   Lab Results  Component Value Date   CHOL 130 04/05/2017   TRIG 51 04/05/2017   HDL 57 04/05/2017   CHOLHDL 2.3 04/05/2017   VLDL 10 04/05/2017   LDLCALC 63 04/05/2017   LDLCALC 89 05/03/2012    Physical Findings: AIMS: Facial and Oral Movements Muscles of Facial Expression: None, normal Lips and Perioral Area: None, normal Jaw: None, normal Tongue: None, normal,Extremity Movements Upper (arms, wrists, hands, fingers): None, normal Lower (legs, knees, ankles, toes): None, normal, Trunk Movements Neck, shoulders, hips: None, normal, Overall Severity Severity of abnormal movements (highest score from questions above): None, normal Incapacitation due to abnormal movements: None, normal Patient's awareness of abnormal movements (rate only patient's report): No Awareness, Dental Status Current problems with teeth and/or dentures?: No Does patient usually wear dentures?: No  CIWA:  CIWA-Ar Total: 1 COWS:  COWS Total Score: 2  Musculoskeletal: Strength & Muscle Tone: within normal limits Gait & Station: normal Patient leans: N/A  Psychiatric Specialty Exam: Physical Exam  Nursing note and vitals reviewed. Constitutional: She appears well-developed.  Neurological: She is alert.  Psychiatric: She has a normal mood and affect.    Review of Systems  Psychiatric/Behavioral: Positive for depression and  hallucinations. The patient is nervous/anxious (improving ) and has insomnia.   All other systems reviewed and are negative.   Blood pressure 128/82, pulse 99, temperature 98.8 F (37.1 C), temperature source Oral, resp. rate 16, height 5\' 6"  (1.676 m), weight 81.6 kg (179 lb 14.3 oz), SpO2 98 %.Body mass index is 29.04 kg/m.  General Appearance: Casual, pleasant, smiling and cooperative    Eye Contact:  Fair  Speech:  Normal Rate   Volume:  Normal  Mood:  Anxious and Depressed  Affect:  Labile  Thought Process:  Disorganized and Irrelevant- improving   Orientation:  Other:  self  Thought Content:  Delusions, Hallucinations: Auditory, Ideas of Reference:   Paranoia Delusions, Paranoid Ideation, Rumination and Tangential  Suicidal Thoughts:  No  Homicidal Thoughts:  No  Memory:  Immediate;   unable to assess Recent;   Poor Remote;   Poor  Judgement:  Poor  Insight:  Shallow  Psychomotor Activity:  Normal patient is visible on the unit   Concentration:  Concentration: Poor and Attention Span: Poor  Recall:  Poor  Fund of Knowledge:  Poor  Language:  Fair  Akathisia:  No  Handed:  Right  AIMS (if indicated):     Assets:  Desire for Improvement Resilience Social Support  ADL's:  Intact  Cognition:  Impaired,  Mild  Sleep:  Number of Hours: 5.5   Treatment Plan Summary: Daily contact with patient to assess and evaluate symptoms and progress in treatment and Medication management    Will continue today 04/11/17 plan as below except where it is noted.  -Continue Depakote ER 750 mg po qhs for mood sx. -Depakote level therapeutic on admission. -Continue Haldol to 10 mg po bid for psychosis. -Haldol decanoate IM 150 mg - received 04/05/2017 - repeat q30 days. -Discontinued unit restriction on 04/08/2017 -Discontinue Ativan - unknown if this is causing cognitive issues. -Will continue Ambien 5 mg po qhs for insomnia. -Order UA /Urine culture- result pending  -CSW will continue to  work on disposition.  Encarnacion Slates, NP, PMHNP, FNP-BC. 04/11/2017, 2:01 PM Patient ID: [name] Ball, female   DOB: August 25, 1965, 51 y.o.   MRN: [id number]

## 2017-04-11 NOTE — Progress Notes (Signed)
D: Pt denies SI/HI/AVH. Pt is pleasant and cooperative. Pt presents disorganized, flight of ideas and paranoid. Pt did not want to talk to writer too much this evening, but was pleasant when engaged. Pt stated she wanted to go home , but appeared to understand the doctor was the only one that could D/C her.   A: Pt was offered support and encouragement. Pt was given scheduled medications. Pt was encourage to attend groups. Q 15 minute checks were done for safety.   R:Pt is taking medication. Pt has no complaints.Pt receptive to treatment and safety maintained on unit.

## 2017-04-11 NOTE — Progress Notes (Signed)
Did not attend group 

## 2017-04-11 NOTE — Plan of Care (Signed)
Problem: Safety: Goal: Ability to redirect hostility and anger into socially appropriate behaviors will improve Outcome: Progressing Pt has been safe on the unit, pt had no outburst this evening  Problem: Medication: Goal: Compliance with prescribed medication regimen will improve Outcome: Progressing Pt has been taking medications as prescribed

## 2017-04-11 NOTE — Progress Notes (Signed)
Recreation Therapy Notes  Date: 04/11/17 Time: 1000 Location: 500 Hall Dayroom  Group Topic: Communication, Team Building, Problem Solving  Goal Area(s) Addresses:  Patient will effectively work with peer towards shared goal.  Patient will identify skill used to make activity successful.  Patient will identify how skills used during activity can be used to reach post d/c goals.   Behavioral Response: None  Intervention: STEM Activity   Activity: Straw Bridge. In teams, patients were asked to build a straw bridge that could hold a small puzzle box.  Each group was given 20 straws and 2 ft of masking tape to complete the project.    Education: Education officer, community, Dentist.   Education Outcome: Acknowledges education/In group clarification offered/Needs additional education.   Clinical Observations/Feedback: Pt did not participate in activity.  Pt sat in chair rocking back and forth.    Victorino Sparrow, LRT/CTRS         Victorino Sparrow A 04/11/2017 12:03 PM

## 2017-04-12 NOTE — Progress Notes (Signed)
DAR NOTE: Patient presents with anxious affect and depressed mood. Pt continues to be disorganized in her thoughts and constantly requesting for phone numbers and asking to be discharged. Denies pain, auditory and visual hallucinations.  Rates depression at 0, hopelessness at 0, and anxiety at 0.  Maintained on routine safety checks.  Medications given as prescribed.  Support and encouragement offered as needed.  Attended group and participated.  States goal for today is "Get a bath."  Patient observed socializing with peers in the dayroom.  Offered no complaint.

## 2017-04-12 NOTE — Progress Notes (Signed)
Recreation Therapy Notes  Date: 04/12/17 Time: 1015 Location: 500 Hall Dayroom  Group Topic: Communication  Goal Area(s) Addresses:  Patient will effectively communicate with peers in group.  Patient will verbalize benefit of healthy communication. Patient will verbalize positive effect of healthy communication on post d/c goals.  Patient will identify communication techniques that made activity effective for group.   Intervention:  [employer], writing utensils, blank paper  Activity:  Back to Back Drawings.  Patients were seated in pairs back to back.  One patient would give directions and the other patient would listen.  The patients doing the talking were given a geometrical picture which they were to give instructions for their partner to draw.  The patients listening where to draw the pictures being described.  Patients would switch roles on the second round.  Education: Communication, Discharge Planning  Education Outcome: Acknowledges understanding/In group clarification offered/Needs additional education.   Clinical Observations/Feedback: Pt did not attend group.    Victorino Sparrow, LRT/CTRS         Victorino Sparrow A 04/12/2017 11:28 AM

## 2017-04-12 NOTE — BHH Group Notes (Signed)
[place] LCSW Group Therapy  04/12/2017  1:05 PM  Type of Therapy:  Group therapy  Participation Level:  Active  Participation Quality:  Attentive  Affect:  Flat  Cognitive:  Oriented  Insight:  Limited  Engagement in Therapy:  Limited  Modes of Intervention:  Discussion, Socialization  Summary of Progress/Problems:  Chaplain was here to lead a group on themes of hope and courage."Hope is planning.  It helps give direction.  I have an ACT team that shows me activities.  I have a certificate.  I am getting out of here on Tuesday because I have court."  [name] Ball 04/12/2017 1:26 PM

## 2017-04-12 NOTE — Progress Notes (Signed)
D: Pt denies SI/HI/AVH. Pt is pleasant and cooperative. Pt presents disorganized, but is much improved from original presentation. Pt stated she needed to get ready for court and talk to her son that's on another hall.   A: Pt was offered support and encouragement. Pt was given scheduled medications. Pt was encourage to attend groups. Q 15 minute checks were done for safety.   R: Pt is taking medication. Pt has no complaints.Pt receptive to treatment and safety maintained on unit.

## 2017-04-12 NOTE — Progress Notes (Signed)
[facility] MD Progress Note  04/12/2017 3:42 PM [name] Ball  MRN:  [id number]    Subjective: [name] Ball reports "If the hospital did not let go by Tuesday next week, then the police or the court will have come in & do something. They have already told me to let them know if this did not happen by Tuesday. I try to understand like I like to be understood. I have done nothing to be locked up in here like this".   Objective:[name] Ball is seen, chart reviewed. She is resting in her room. She is rocking forth 7 back, talking to herself. She is awake, alert, smiling, cooperative and pleasant. She presents with a reactive affects, delusional, disorganized & tangential. She does appear to be responding to internal stimuli. No changes made on the current plan of care. No disruptive behavior noted on the unit.  Principal Problem: Bipolar disorder, current episode manic, severe with psychotic features ([place]) Diagnosis:   Patient Active Problem List   Diagnosis Date Noted  . Bipolar disorder, current episode manic, severe with psychotic features ([place]) [F31.2] 04/03/2017  . Hepatitis C virus infection without hepatic coma [B19.20]   . Bipolar disorder ([place]) [F31.9]   . GERD (gastroesophageal reflux disease) [K21.9]    Total Time spent with patient: 15 minutes  Past Psychiatric History: Please see H&P.  Past Medical History:  Past Medical History:  Diagnosis Date  . Bipolar disorder ([place])   . GERD (gastroesophageal reflux disease)   . Hepatitis C virus infection without hepatic coma     Past Surgical History:  Procedure Laterality Date  . abdominal cyst removed    . CESAREAN SECTION    . COLON SURGERY     Family History: Please see H&P.  Family Psychiatric  History: Please see H&P.  Social History: Please see H&P.  History  Alcohol Use No     History  Drug Use No    Social History   Social History  . Marital status: Unknown    Spouse name: N/A  . Number of children: N/A  . Years of  education: N/A   Social History Main Topics  . Smoking status: Current Every Day Smoker  . Smokeless tobacco: Never Used  . Alcohol use No  . Drug use: No  . Sexual activity: Not Asked   Other Topics Concern  . None   Social History Narrative  . None   Additional Social History:    Pain Medications: See MAR Prescriptions: See MAR Over the Counter: See MAR History of alcohol / drug use?: No history of alcohol / drug abuse Longest period of sobriety (when/how long): NA  Sleep: Fair  Appetite:  Fair  Current Medications: Current Facility-Administered Medications  Medication Dose Route Frequency Provider Last Rate Last Dose  . benztropine (COGENTIN) tablet 0.5 mg  0.5 mg Oral BID Rankin, Shuvon B, NP   0.5 mg at 04/12/17 0851  . diltiazem (DILACOR XR) 24 hr capsule 120 mg  120 mg Oral Daily Rankin, Shuvon B, NP   120 mg at 04/12/17 0852  . divalproex (DEPAKOTE) DR tablet 750 mg  750 mg Oral QHS Rankin, Shuvon B, NP   750 mg at 04/11/17 2242  . haloperidol (HALDOL) tablet 10 mg  10 mg Oral BID Ursula Alert, MD   10 mg at 04/12/17 0852  . [START ON 05/06/2017] haloperidol decanoate (HALDOL DECANOATE) 100 MG/ML injection 150 mg  150 mg Intramuscular Q30 days Ursula Alert, MD      . ibuprofen (  ADVIL,MOTRIN) tablet 200-400 mg  200-400 mg Oral Q4H PRN Rankin, Shuvon B, NP   400 mg at 04/04/17 1958  . loperamide (IMODIUM) capsule 2 mg  2 mg Oral PRN Lindon Romp A, NP      . magnesium hydroxide (MILK OF MAGNESIA) suspension 30 mL  30 mL Oral Daily PRN Rankin, Shuvon B, NP      . nicotine (NICODERM CQ - dosed in mg/24 hr) patch 7 mg  7 mg Transdermal Daily Patriciaann Clan E, PA-C   7 mg at 04/11/17 0851  . OLANZapine zydis (ZYPREXA) disintegrating tablet 5 mg  5 mg Oral BID PRN Ursula Alert, MD       Or  . OLANZapine (ZYPREXA) injection 5 mg  5 mg Intramuscular BID PRN Eappen, Saramma, MD      . ondansetron (ZOFRAN) tablet 4 mg  4 mg Oral Q8H PRN Rankin, Shuvon B, NP      .  pantoprazole (PROTONIX) EC tablet 40 mg  40 mg Oral Daily Rankin, Shuvon B, NP   40 mg at 04/12/17 0852  . zolpidem (AMBIEN) tablet 5 mg  5 mg Oral QHS PRN Cobos, Myer Peer, MD   5 mg at 04/11/17 2242   Lab Results:  No results found for this or any previous visit (from the past 48 hour(s)).  Blood Alcohol level:  Lab Results  Component Value Date   ETH <5 03/31/2017   ETH <5 71/11/2692   Metabolic Disorder Labs: Lab Results  Component Value Date   HGBA1C 4.6 (L) 04/05/2017   MPG 85.32 04/05/2017   Lab Results  Component Value Date   PROLACTIN 50.6 (H) 04/05/2017   Lab Results  Component Value Date   CHOL 130 04/05/2017   TRIG 51 04/05/2017   HDL 57 04/05/2017   CHOLHDL 2.3 04/05/2017   VLDL 10 04/05/2017   LDLCALC 63 04/05/2017   LDLCALC 89 05/03/2012   Physical Findings: AIMS: Facial and Oral Movements Muscles of Facial Expression: None, normal Lips and Perioral Area: None, normal Jaw: None, normal Tongue: None, normal,Extremity Movements Upper (arms, wrists, hands, fingers): None, normal Lower (legs, knees, ankles, toes): None, normal, Trunk Movements Neck, shoulders, hips: None, normal, Overall Severity Severity of abnormal movements (highest score from questions above): None, normal Incapacitation due to abnormal movements: None, normal Patient's awareness of abnormal movements (rate only patient's report): No Awareness, Dental Status Current problems with teeth and/or dentures?: No Does patient usually wear dentures?: No  CIWA:  CIWA-Ar Total: 1 COWS:  COWS Total Score: 2  Musculoskeletal: Strength & Muscle Tone: within normal limits Gait & Station: normal Patient leans: N/A  Psychiatric Specialty Exam: Physical Exam  Nursing note and vitals reviewed. Constitutional: She appears well-developed.  Neurological: She is alert.  Psychiatric: She has a normal mood and affect.    Review of Systems  Psychiatric/Behavioral: Positive for depression and  hallucinations. The patient is nervous/anxious (improving ) and has insomnia.   All other systems reviewed and are negative.   Blood pressure 105/62, pulse 86, temperature 98.6 F (37 C), temperature source Oral, resp. rate 18, height 5\' 6"  (1.676 m), weight 81.6 kg (179 lb 14.3 oz), SpO2 98 %.Body mass index is 29.04 kg/m.  General Appearance: Casual, pleasant, smiling and cooperative    Eye Contact:  Fair  Speech:  Normal Rate   Volume:  Normal  Mood:  Anxious and Depressed  Affect:  Labile  Thought Process:  Disorganized and Irrelevant- improving   Orientation:  Other:  self  Thought Content:  Delusions, Hallucinations: Auditory, Ideas of Reference:   Paranoia Delusions, Paranoid Ideation, Rumination and Tangential  Suicidal Thoughts:  No  Homicidal Thoughts:  No  Memory:  Immediate;   unable to assess Recent;   Poor Remote;   Poor  Judgement:  Poor  Insight:  Shallow  Psychomotor Activity:  Normal patient is visible on the unit   Concentration:  Concentration: Poor and Attention Span: Poor  Recall:  Poor  Fund of Knowledge:  Poor  Language:  Fair  Akathisia:  No  Handed:  Right  AIMS (if indicated):     Assets:  Desire for Improvement Resilience Social Support  ADL's:  Intact  Cognition:  Impaired,  Mild  Sleep:  Number of Hours: 6.25   Treatment Plan Summary: Daily contact with patient to assess and evaluate symptoms and progress in treatment and Medication management    Will continue today 04/12/17 plan as below except where it is noted.  -Continue Depakote ER 750 mg po qhs for mood sx. -Depakote level therapeutic on admission. -Continue Haldol to 10 mg po bid for psychosis. -Haldol decanoate IM 150 mg - received 04/05/2017 - repeat q30 days. -Discontinued unit restriction on 04/08/2017 -Discontinue Ativan - unknown if this is causing cognitive issues. -Will continue Ambien 5 mg po qhs for insomnia. -Order UA /Urine culture- result pending  -CSW will continue to  work on disposition.  Encarnacion Slates, NP, PMHNP, FNP-BC. 04/12/2017, 3:42 PM Patient ID: [name] Ball, female   DOB: [birth year], 51 y.o.   MRN: [id number] Agree with NP Progress Note

## 2017-04-12 NOTE — Plan of Care (Signed)
Problem: Safety: Goal: Periods of time without injury will increase Outcome: Progressing Pt safe on the unit at this time  Problem: Coping: Goal: Ability to cope will improve Outcome: Progressing Pt denies SI at this time  Problem: Medication: Goal: Compliance with prescribed medication regimen will improve Outcome: Progressing Pt taking medications as prescribed at this time

## 2017-04-12 NOTE — Tx Team (Signed)
Interdisciplinary Treatment and Diagnostic Plan Update  04/12/2017 Time of Session: 2:03 PM  Oleta Gunnoe MRN: [id number]  Principal Diagnosis: Bipolar affective disorder, current episode, mania with psychotic features. Secondary Diagnoses: Principal Problem:   Bipolar disorder, current episode manic, severe with psychotic features ([place])   Current Medications:  Current Facility-Administered Medications  Medication Dose Route Frequency Provider Last Rate Last Dose  . benztropine (COGENTIN) tablet 0.5 mg  0.5 mg Oral BID Rankin, Shuvon B, NP   0.5 mg at 04/12/17 0851  . diltiazem (DILACOR XR) 24 hr capsule 120 mg  120 mg Oral Daily Rankin, Shuvon B, NP   120 mg at 04/12/17 0852  . divalproex (DEPAKOTE) DR tablet 750 mg  750 mg Oral QHS Rankin, Shuvon B, NP   750 mg at 04/11/17 2242  . haloperidol (HALDOL) tablet 10 mg  10 mg Oral BID Ursula Alert, MD   10 mg at 04/12/17 0852  . [START ON 05/06/2017] haloperidol decanoate (HALDOL DECANOATE) 100 MG/ML injection 150 mg  150 mg Intramuscular Q30 days Eappen, Ria Clock, MD      . ibuprofen (ADVIL,MOTRIN) tablet 200-400 mg  200-400 mg Oral Q4H PRN Rankin, Shuvon B, NP   400 mg at 04/04/17 1958  . loperamide (IMODIUM) capsule 2 mg  2 mg Oral PRN Lindon Romp A, NP      . magnesium hydroxide (MILK OF MAGNESIA) suspension 30 mL  30 mL Oral Daily PRN Rankin, Shuvon B, NP      . nicotine (NICODERM CQ - dosed in mg/24 hr) patch 7 mg  7 mg Transdermal Daily Patriciaann Clan E, PA-C   7 mg at 04/11/17 0851  . OLANZapine zydis (ZYPREXA) disintegrating tablet 5 mg  5 mg Oral BID PRN Ursula Alert, MD       Or  . OLANZapine (ZYPREXA) injection 5 mg  5 mg Intramuscular BID PRN Eappen, Saramma, MD      . ondansetron (ZOFRAN) tablet 4 mg  4 mg Oral Q8H PRN Rankin, Shuvon B, NP      . pantoprazole (PROTONIX) EC tablet 40 mg  40 mg Oral Daily Rankin, Shuvon B, NP   40 mg at 04/12/17 0852  . zolpidem (AMBIEN) tablet 5 mg  5 mg Oral QHS PRN Cobos, Myer Peer,  MD   5 mg at 04/11/17 2242    PTA Medications: Prescriptions Prior to Admission  Medication Sig Dispense Refill Last Dose  . benztropine (COGENTIN) 0.5 MG tablet Take 0.5 mg by mouth 2 (two) times daily.   unknown  . diltiazem (DILACOR XR) 120 MG 24 hr capsule Take 120 mg by mouth daily.   unknown  . divalproex (DEPAKOTE) 250 MG DR tablet Take 750 mg by mouth at bedtime.    unknown  . haloperidol (HALDOL) 10 MG tablet Take 10 mg by mouth 3 (three) times daily.   unknown  . haloperidol decanoate (HALDOL DECANOATE) 100 MG/ML injection Inject 150 mg into the muscle every 28 (twenty-eight) days.    unknown  . ibuprofen (ADVIL,MOTRIN) 200 MG tablet Take 200-400 mg by mouth every 4 (four) hours as needed for moderate pain.    unknown  . LORazepam (ATIVAN) 1 MG tablet Take 1 mg by mouth every 6 (six) hours as needed for anxiety.   unknown  . pantoprazole (PROTONIX) 20 MG tablet Take 20 mg by mouth daily.   unknown  . QUEtiapine (SEROQUEL) 50 MG tablet Take 50 mg by mouth at bedtime.   unknown  . traZODone (DESYREL) 100 MG  tablet Take 100 mg by mouth at bedtime as needed for sleep.   unknown  . zolpidem (AMBIEN) 10 MG tablet Take 10 mg by mouth at bedtime.   unknown    Treatment Modalities: Medication Management, Group therapy, Case management,  1 to 1 session with clinician, Psychoeducation, Recreational therapy.  Patient Stressors: Medication change or noncompliance  Patient Strengths: Ability for insight Communication skills   Physician Treatment Plan for Primary Diagnosis: Bipolar affective disorder, current episode, mania with psychotic features. Long Term Goal(s): Improvement in symptoms so as ready for discharge  Short Term Goals: Ability to identify changes in lifestyle to reduce recurrence of condition will improve Ability to verbalize feelings will improve Ability to identify changes in lifestyle to reduce recurrence of condition will improve Ability to verbalize feelings will  improve Ability to demonstrate self-control will improve  Medication Management: Evaluate patient's response, side effects, and tolerance of medication regimen.  Therapeutic Interventions: 1 to 1 sessions, Unit Group sessions and Medication administration.  Evaluation of Outcomes: Progressing   10/12:  Pt is progressing slowly.  Able to respond appropriately to question before veering off into circumstantial thought.  Continue Depakote ER 750 mg po qhs for mood sx. -Depakote level therapeutic on admission. -Continue Haldol to 10 mg po bid for psychosis. -Haldol decanoate IM 150 mg - received 04/05/2017 - repeat q30 days. -Discontinued unit restriction on 04/08/2017 -Discontinue Ativan - unknown if this is causing cognitive issues. -Will continue Ambien 5 mg po qhs for insomnia.  Physician Treatment Plan for Secondary Diagnosis: Principal Problem:   Bipolar disorder, current episode manic, severe with psychotic features ([place])  Long Term Goal(s): Improvement in symptoms so as ready for discharge  Short Term Goals: Ability to identify changes in lifestyle to reduce recurrence of condition will improve Ability to verbalize feelings will improve Ability to identify changes in lifestyle to reduce recurrence of condition will improve Ability to verbalize feelings will improve Ability to demonstrate self-control will improve  Medication Management: Evaluate patient's response, side effects, and tolerance of medication regimen.  Therapeutic Interventions: 1 to 1 sessions, Unit Group sessions and Medication administration.  Evaluation of Outcomes: Progressing   RN Treatment Plan for Primary Diagnosis: Bipolar affective disorder, current episode, mania with psychotic features. Long Term Goal(s): Knowledge of disease and therapeutic regimen to maintain health will improve  Short Term Goals: Ability to remain free from injury will improve, Ability to disclose and discuss suicidal ideas,  Ability to identify and develop effective coping behaviors will improve and Compliance with prescribed medications will improve  Medication Management: RN will administer medications as ordered by provider, will assess and evaluate patient's response and provide education to patient for prescribed medication. RN will report any adverse and/or side effects to prescribing provider.  Therapeutic Interventions: 1 on 1 counseling sessions, Psychoeducation, Medication administration, Evaluate responses to treatment, Monitor vital signs and CBGs as ordered, Perform/monitor CIWA, COWS, AIMS and Fall Risk screenings as ordered, Perform wound care treatments as ordered.  Evaluation of Outcomes: Progressing   LCSW Treatment Plan for Primary Diagnosis: Bipolar affective disorder, current episode, mania with psychotic features. Long Term Goal(s): Safe transition to appropriate next level of care at discharge, Engage patient in therapeutic group addressing interpersonal concerns.  Short Term Goals: Engage patient in aftercare planning with referrals and resources, Facilitate acceptance of mental health diagnosis and concerns, Identify triggers associated with mental health/substance abuse issues and Increase skills for wellness and recovery  Therapeutic Interventions: Assess for all discharge needs, 1  to 1 time with Education officer, museum, Explore available resources and support systems, Assess for adequacy in community support network, Educate family and significant other(s) on suicide prevention, Complete Psychosocial Assessment, Interpersonal group therapy.  Evaluation of Outcomes: Adequate for Discharge   Progress in Treatment: Attending groups: Yes Participating in groups: Yes Taking medication as prescribed: Yes Toleration of medication: Yes, no side effects reported at this time Family/Significant other contact made: Ludger Nutting [phone], guardian  Patient understands diagnosis: No, limited  insight  Discussing patient identified problems/goals with staff: Yes Medical problems stabilized or resolved: Yes Denies suicidal/homicidal ideation: Yes Issues/concerns per patient self-inventory: None Other: N/A  New problem(s) identified: None identified at this time.   New Short Term/Long Term Goal(s):  Guardian, Symphanie Uvaldo Rising, wants Chevon to receive help with "Medication stabilization".   Discharge Plan or Barriers: Pt will return to [place] , follow up [facility] ACT team  Reason for Continuation of Hospitalization: Medication stabilization    Estimated Length of Stay: 10/17  Attendees: Patient:  04/12/2017  2:03 PM  Physician: Neita Garnet, MD 04/12/2017  2:03 PM  Nursing: Jonette Mate, RN 04/12/2017  2:03 PM  RN Care Manager: Lars Pinks, RN 04/12/2017  2:03 PM  Social Worker: Ripley Fraise, [place]; Verdis Frederickson, Social Work Intern 04/12/2017  2:03 PM  Recreational Therapist: Victorino Sparrow, LRT 04/12/2017  2:03 PM  Other: Norberto Sorenson, [place] 04/12/2017  2:03 PM  Other:  04/12/2017  2:03 PM  Other: 04/12/2017  2:03 PM    Scribe for Treatment Team: Trish Mage, LCSW 04/12/2017 2:03 PM

## 2017-04-13 NOTE — Progress Notes (Signed)
[facility] MD Progress Note  04/13/2017 1:47 PM [name] Ball  MRN:  [id number]    Subjective: [name] Ball reports "I need to be discharged with my son, we can in at the same time." Patient is requesting for calls to be made to her social worker/ case manager "Caren Griffins in [place]"   Objective:[name] Ball is awake and alert, seen resting in dayroom interacting with staff and peers. Patient is pleasant however continues to present with disorganized and tangential thoughts. Patient continues to be focused with discharging. Appears more goal directed during this assessment. Support, encouragement and reassurance was provided.   Principal Problem: Bipolar disorder, current episode manic, severe with psychotic features ([place]) Diagnosis:   Patient Active Problem List   Diagnosis Date Noted  . Bipolar disorder, current episode manic, severe with psychotic features ([place]) [F31.2] 04/03/2017  . Hepatitis C virus infection without hepatic coma [B19.20]   . Bipolar disorder ([place]) [F31.9]   . GERD (gastroesophageal reflux disease) [K21.9]    Total Time spent with patient: 15 minutes  Past Psychiatric History: Please see H&P.  Past Medical History:  Past Medical History:  Diagnosis Date  . Bipolar disorder ([place])   . GERD (gastroesophageal reflux disease)   . Hepatitis C virus infection without hepatic coma     Past Surgical History:  Procedure Laterality Date  . abdominal cyst removed    . CESAREAN SECTION    . COLON SURGERY     Family History: Please see H&P.  Family Psychiatric  History: Please see H&P.  Social History: Please see H&P.  History  Alcohol Use No     History  Drug Use No    Social History   Social History  . Marital status: Unknown    Spouse name: N/A  . Number of children: N/A  . Years of education: N/A   Social History Main Topics  . Smoking status: Current Every Day Smoker  . Smokeless tobacco: Never Used  . Alcohol use No  . Drug use: No  . Sexual activity: Not  Asked   Other Topics Concern  . None   Social History Narrative  . None   Additional Social History:    Pain Medications: See MAR Prescriptions: See MAR Over the Counter: See MAR History of alcohol / drug use?: No history of alcohol / drug abuse Longest period of sobriety (when/how long): NA  Sleep: Fair  Appetite:  Fair  Current Medications: Current Facility-Administered Medications  Medication Dose Route Frequency Provider Last Rate Last Dose  . benztropine (COGENTIN) tablet 0.5 mg  0.5 mg Oral BID Rankin, Shuvon B, NP   0.5 mg at 04/13/17 0757  . diltiazem (DILACOR XR) 24 hr capsule 120 mg  120 mg Oral Daily Rankin, Shuvon B, NP   120 mg at 04/13/17 0757  . divalproex (DEPAKOTE) DR tablet 750 mg  750 mg Oral QHS Rankin, Shuvon B, NP   750 mg at 04/12/17 2203  . haloperidol (HALDOL) tablet 10 mg  10 mg Oral BID Ursula Alert, MD   10 mg at 04/13/17 0757  . [START ON 05/06/2017] haloperidol decanoate (HALDOL DECANOATE) 100 MG/ML injection 150 mg  150 mg Intramuscular Q30 days Eappen, Ria Clock, MD      . ibuprofen (ADVIL,MOTRIN) tablet 200-400 mg  200-400 mg Oral Q4H PRN Rankin, Shuvon B, NP   400 mg at 04/04/17 1958  . loperamide (IMODIUM) capsule 2 mg  2 mg Oral PRN Lindon Romp A, NP      . magnesium hydroxide (MILK  OF MAGNESIA) suspension 30 mL  30 mL Oral Daily PRN Rankin, Shuvon B, NP      . nicotine (NICODERM CQ - dosed in mg/24 hr) patch 7 mg  7 mg Transdermal Daily Patriciaann Clan E, PA-C   7 mg at 04/11/17 0851  . OLANZapine zydis (ZYPREXA) disintegrating tablet 5 mg  5 mg Oral BID PRN Ursula Alert, MD       Or  . OLANZapine (ZYPREXA) injection 5 mg  5 mg Intramuscular BID PRN Eappen, Saramma, MD      . ondansetron (ZOFRAN) tablet 4 mg  4 mg Oral Q8H PRN Rankin, Shuvon B, NP      . pantoprazole (PROTONIX) EC tablet 40 mg  40 mg Oral Daily Rankin, Shuvon B, NP   40 mg at 04/13/17 0757  . zolpidem (AMBIEN) tablet 5 mg  5 mg Oral QHS PRN Cobos, Myer Peer, MD   5 mg at  04/12/17 2204   Lab Results:  No results found for this or any previous visit (from the past 48 hour(s)).  Blood Alcohol level:  Lab Results  Component Value Date   ETH <5 03/31/2017   ETH <5 31/49/7026   Metabolic Disorder Labs: Lab Results  Component Value Date   HGBA1C 4.6 (L) 04/05/2017   MPG 85.32 04/05/2017   Lab Results  Component Value Date   PROLACTIN 50.6 (H) 04/05/2017   Lab Results  Component Value Date   CHOL 130 04/05/2017   TRIG 51 04/05/2017   HDL 57 04/05/2017   CHOLHDL 2.3 04/05/2017   VLDL 10 04/05/2017   LDLCALC 63 04/05/2017   LDLCALC 89 05/03/2012   Physical Findings: AIMS: Facial and Oral Movements Muscles of Facial Expression: None, normal Lips and Perioral Area: None, normal Jaw: None, normal Tongue: None, normal,Extremity Movements Upper (arms, wrists, hands, fingers): None, normal Lower (legs, knees, ankles, toes): None, normal, Trunk Movements Neck, shoulders, hips: None, normal, Overall Severity Severity of abnormal movements (highest score from questions above): None, normal Incapacitation due to abnormal movements: None, normal Patient's awareness of abnormal movements (rate only patient's report): No Awareness, Dental Status Current problems with teeth and/or dentures?: No Does patient usually wear dentures?: No  CIWA:  CIWA-Ar Total: 1 COWS:  COWS Total Score: 2  Musculoskeletal: Strength & Muscle Tone: within normal limits Gait & Station: normal Patient leans: N/A  Psychiatric Specialty Exam: Physical Exam  Nursing note and vitals reviewed. Constitutional: She appears well-developed.  Neurological: She is alert.  Psychiatric: She has a normal mood and affect.    Review of Systems  Psychiatric/Behavioral: Positive for depression and hallucinations. The patient is nervous/anxious (improving ) and has insomnia.   All other systems reviewed and are negative.   Blood pressure 107/69, pulse (!) 123, temperature 98.8 F (37.1  C), resp. rate 16, height 5\' 6"  (1.676 m), weight 81.6 kg (179 lb 14.3 oz), SpO2 98 %.Body mass index is 29.04 kg/m.  General Appearance: Casual and pleseant  Eye Contact:  Fair  Speech:  Normal Rate   Volume:  Normal  Mood:  Anxious and Depressed  Affect:  Labile  Thought Process:  Disorganized and Irrelevant- improving   Orientation:  Other:  self  Thought Content:  Delusions, Hallucinations: Auditory, Ideas of Reference:   Paranoia Delusions, Paranoid Ideation, Rumination and Tangential  Suicidal Thoughts:  No  Homicidal Thoughts:  No  Memory:  Immediate;   unable to assess Recent;   Poor Remote;   Poor  Judgement:  Poor  Insight:  Shallow  Psychomotor Activity:  Normal patient is visible on the unit   Concentration:  Concentration: Poor and Attention Span: Poor  Recall:  Poor  Fund of Knowledge:  Poor  Language:  Fair  Akathisia:  No  Handed:  Right  AIMS (if indicated):     Assets:  Desire for Improvement Resilience Social Support  ADL's:  Intact  Cognition:  Impaired,  Mild  Sleep:  Number of Hours: 4.75     I agree with current treatment plan on 04/13/2017, Patient seen face-to-face for psychiatric evaluation follow-up, chart reviewed. Reviewed the information documented and agree with the treatment plan.   Treatment Plan Summary: Daily contact with patient to assess and evaluate symptoms and progress in treatment and Medication management    Will continue today 04/13/17 plan as below except where it is noted.  -Continue Depakote ER 750 mg po qhs for mood sx. -Depakote level therapeutic on admission. -Continue Haldol to 10 mg po bid for psychosis. -Haldol decanoate IM 150 mg - received 04/05/2017 - repeat q30 days. -Discontinued unit restriction on 04/08/2017 -Discontinue Ativan - unknown if this is causing cognitive issues. -Will continue Ambien 5 mg po qhs for insomnia. -Order UA /Urine culture- result pending  -CSW will continue to work on disposition. Will  follow-up with social worker for ACT assessment and group home placement  Derrill Center, NP 04/13/2017, 1:47 PM

## 2017-04-13 NOTE — Progress Notes (Signed)
D: Pt denies SI/HI/AVH. Pt is pleasant and cooperative. Pt stated she was doing good. Pt kept to herself this evening.   A: Pt was offered support and encouragement. Pt was given scheduled medications. Pt was encourage to attend groups. Q 15 minute checks were done for safety.   R: safety maintained on unit.

## 2017-04-13 NOTE — Progress Notes (Signed)
Patient ID: [name] Ball, female   DOB: Nov 06, 1965, 51 y.o.   MRN: [id number]    D: Pt has been pleasant and cooperative on the unit today. Pt remains very disorganized in her thinking, still focusing in on her children and leaving. Pt does go to group, but has no insight. Pt refused to fill out her self inventory sheet, however reported no depression, hopelessness, or anxiety. Pt reported being negative SI/HI, no AH/VH noted. A: 15 min checks continued for patient safety. R: Pt safety maintained.

## 2017-04-13 NOTE — [facility] Group Notes (Signed)
  [facility] LCSW Group Therapy Note  Date/Time:  04/13/2017 11:15AM-12:00PM  Type of Therapy and Topic:  Group Therapy:  Feelings About Hospitalization  Participation Level:  Minimal   Description of Group This process group involved patients discussing their feelings related to being hospitalized, as well as the benefits they see to being in the hospital.  These feelings and benefits were itemized.  The group then brainstormed specific ways in which they could seek those same benefits when they discharge and return home.  Therapeutic Goals 1. Patient will identify and describe positive and negative feelings related to hospitalization 2. Patient will verbalize benefits of hospitalization to themselves personally 3. Patients will brainstorm together ways they can obtain similar benefits in the outpatient setting, identify barriers to wellness and possible solutions  Summary of Patient Progress:  The patient expressed her primary feelings about being hospitalized are "I have overstayed, I've been here the longest of anybody, I don't need Caren Griffins to tell me to go to a group home.  I want to move to [place].  My son is wandering around crying and he needs his mother."  When asked the age of her son, she reported 7-6yo.  CSW reminded her that she has been here a little while so everybody in her family knows she is in the hospital and he is not wandering around.  She insisted that CSW needs to let others know she does not want to go to a group home and does not need Caren Griffins in her life.  Therapeutic Modalities Cognitive Behavioral Therapy Motivational Interviewing    Selmer Dominion, LCSW 04/13/2017, 12:52 PM

## 2017-04-13 NOTE — Progress Notes (Signed)
Psychoeducational Group Note  Date:  04/13/2017 Time:  0413  Group Topic/Focus:  Wrap-Up Group:   The focus of this group is to help patients review their daily goal of treatment and discuss progress on daily workbooks.  Participation Level: Did Not Attend  Participation Quality:  Not Applicable  Affect:  Not Applicable  Cognitive:  Not Applicable  Insight:  Not Applicable  Engagement in Group: Not Applicable  Additional Comments:  The patient did not attend group.   Jolanta Cabeza S 04/13/2017, 4:13 AM

## 2017-04-13 NOTE — Progress Notes (Signed)
Psychoeducational Group Note  Date:  04/13/2017 Time:  2355  Group Topic/Focus:  Wrap-Up Group:   The focus of this group is to help patients review their daily goal of treatment and discuss progress on daily workbooks.  Participation Level: Did Not Attend  Participation Quality:  Not Applicable  Affect:  Not Applicable  Cognitive:  Not Applicable  Insight:  Not Applicable  Engagement in Group: Not Applicable  Additional Comments:  The patient did not attend group since she was asleep in her bed.   Archie Balboa S 04/13/2017, 11:55 PM

## 2017-04-13 NOTE — Plan of Care (Signed)
Problem: Safety: Goal: Ability to redirect hostility and anger into socially appropriate behaviors will improve Outcome: Progressing Pt safe on the unit at this time  Problem: Medication: Goal: Compliance with prescribed medication regimen will improve Outcome: Progressing Pt taking medications as prescribed

## 2017-04-14 NOTE — Progress Notes (Signed)
[facility] MD Progress Note  04/14/2017 11:06 AM [name] Ball  MRN:  [id number]    Subjective: [name] Ball reports continues to request discharge back home. Reports she has a court day and needs to be with her son.   Objective: [name] Ball is awake and alert, with disorganized and tangential thoughts. Patient is pleasant and cooperative. Patient mood and affect has improved since admission. Patient is easily redirectable.staff reports patient is medication compliant.  Support, encouragement and reassurance was provided.  Principal Problem: Bipolar disorder, current episode manic, severe with psychotic features ([place]) Diagnosis:   Patient Active Problem List   Diagnosis Date Noted  . Bipolar disorder, current episode manic, severe with psychotic features ([place]) [F31.2] 04/03/2017  . Hepatitis C virus infection without hepatic coma [B19.20]   . Bipolar disorder ([place]) [F31.9]   . GERD (gastroesophageal reflux disease) [K21.9]    Total Time spent with patient: 15 minutes  Past Psychiatric History: Please see H&P.  Past Medical History:  Past Medical History:  Diagnosis Date  . Bipolar disorder ([place])   . GERD (gastroesophageal reflux disease)   . Hepatitis C virus infection without hepatic coma     Past Surgical History:  Procedure Laterality Date  . abdominal cyst removed    . CESAREAN SECTION    . COLON SURGERY     Family History: Please see H&P.  Family Psychiatric  History: Please see H&P.  Social History: Please see H&P.  History  Alcohol Use No     History  Drug Use No    Social History   Social History  . Marital status: Unknown    Spouse name: N/A  . Number of children: N/A  . Years of education: N/A   Social History Main Topics  . Smoking status: Current Every Day Smoker  . Smokeless tobacco: Never Used  . Alcohol use No  . Drug use: No  . Sexual activity: Not Asked   Other Topics Concern  . None   Social History Narrative  . None   Additional Social History:     Pain Medications: See MAR Prescriptions: See MAR Over the Counter: See MAR History of alcohol / drug use?: No history of alcohol / drug abuse Longest period of sobriety (when/how long): NA  Sleep: Fair  Appetite:  Fair  Current Medications: Current Facility-Administered Medications  Medication Dose Route Frequency Provider Last Rate Last Dose  . benztropine (COGENTIN) tablet 0.5 mg  0.5 mg Oral BID Rankin, Shuvon B, NP   0.5 mg at 04/14/17 0905  . diltiazem (DILACOR XR) 24 hr capsule 120 mg  120 mg Oral Daily Rankin, Shuvon B, NP   120 mg at 04/14/17 0905  . divalproex (DEPAKOTE) DR tablet 750 mg  750 mg Oral QHS Rankin, Shuvon B, NP   750 mg at 04/13/17 2333  . haloperidol (HALDOL) tablet 10 mg  10 mg Oral BID Ursula Alert, MD   10 mg at 04/14/17 0904  . [START ON 05/06/2017] haloperidol decanoate (HALDOL DECANOATE) 100 MG/ML injection 150 mg  150 mg Intramuscular Q30 days Eappen, Ria Clock, MD      . ibuprofen (ADVIL,MOTRIN) tablet 200-400 mg  200-400 mg Oral Q4H PRN Rankin, Shuvon B, NP   400 mg at 04/04/17 1958  . loperamide (IMODIUM) capsule 2 mg  2 mg Oral PRN Lindon Romp A, NP      . magnesium hydroxide (MILK OF MAGNESIA) suspension 30 mL  30 mL Oral Daily PRN Rankin, Shuvon B, NP      .  nicotine (NICODERM CQ - dosed in mg/24 hr) patch 7 mg  7 mg Transdermal Daily Patriciaann Clan E, PA-C   7 mg at 04/11/17 0851  . OLANZapine zydis (ZYPREXA) disintegrating tablet 5 mg  5 mg Oral BID PRN Ursula Alert, MD       Or  . OLANZapine (ZYPREXA) injection 5 mg  5 mg Intramuscular BID PRN Eappen, Saramma, MD      . ondansetron (ZOFRAN) tablet 4 mg  4 mg Oral Q8H PRN Rankin, Shuvon B, NP      . pantoprazole (PROTONIX) EC tablet 40 mg  40 mg Oral Daily Rankin, Shuvon B, NP   40 mg at 04/14/17 0906  . zolpidem (AMBIEN) tablet 5 mg  5 mg Oral QHS PRN Cobos, Myer Peer, MD   5 mg at 04/13/17 2334   Lab Results:  No results found for this or any previous visit (from the past 48  hour(s)).  Blood Alcohol level:  Lab Results  Component Value Date   ETH <5 03/31/2017   ETH <5 50/93/2671   Metabolic Disorder Labs: Lab Results  Component Value Date   HGBA1C 4.6 (L) 04/05/2017   MPG 85.32 04/05/2017   Lab Results  Component Value Date   PROLACTIN 50.6 (H) 04/05/2017   Lab Results  Component Value Date   CHOL 130 04/05/2017   TRIG 51 04/05/2017   HDL 57 04/05/2017   CHOLHDL 2.3 04/05/2017   VLDL 10 04/05/2017   LDLCALC 63 04/05/2017   LDLCALC 89 05/03/2012   Physical Findings: AIMS: Facial and Oral Movements Muscles of Facial Expression: None, normal Lips and Perioral Area: None, normal Jaw: None, normal Tongue: None, normal,Extremity Movements Upper (arms, wrists, hands, fingers): None, normal Lower (legs, knees, ankles, toes): None, normal, Trunk Movements Neck, shoulders, hips: None, normal, Overall Severity Severity of abnormal movements (highest score from questions above): None, normal Incapacitation due to abnormal movements: None, normal Patient's awareness of abnormal movements (rate only patient's report): No Awareness, Dental Status Current problems with teeth and/or dentures?: No Does patient usually wear dentures?: No  CIWA:  CIWA-Ar Total: 1 COWS:  COWS Total Score: 2  Musculoskeletal: Strength & Muscle Tone: within normal limits Gait & Station: normal Patient leans: N/A  Psychiatric Specialty Exam: Physical Exam  Nursing note and vitals reviewed. Constitutional: She appears well-developed.  Neurological: She is alert.  Psychiatric: She has a normal mood and affect.    Review of Systems  Psychiatric/Behavioral: The patient is nervous/anxious (improving ).   All other systems reviewed and are negative.   Blood pressure 109/77, pulse 98, temperature 98.5 F (36.9 C), temperature source Oral, resp. rate 18, height 5\' 6"  (1.676 m), weight 81.6 kg (179 lb 14.3 oz), SpO2 98 %.Body mass index is 29.04 kg/m.  General Appearance:  Casual and pleasant  Eye Contact:  Fair  Speech:  Normal Rate   Volume:  Normal  Mood:  Anxious and Depressed  Affect:  Labile  Thought Process:  Disorganized and Irrelevant- improving   Orientation:  Other:  self  Thought Content:  Delusions, Hallucinations: Auditory, Paranoid Ideation, Rumination and Tangential  Suicidal Thoughts:  No  Homicidal Thoughts:  No  Memory:  Immediate;   unable to assess Recent;   Poor Remote;   Poor  Judgement:  Poor  Insight:  Shallow  Psychomotor Activity:  Normal patient is visible on the unit   Concentration:  Concentration: Poor and Attention Span: Poor  Recall:  Poor  Fund of Knowledge:  Poor  Language:  Fair  Akathisia:  No  Handed:  Right  AIMS (if indicated):     Assets:  Communication Skills Desire for Improvement Resilience Social Support  ADL's:  Intact  Cognition:  Impaired,  Mild  Sleep:  Number of Hours: 5.75     I agree with current treatment plan on 04/14/2017, Patient seen face-to-face for psychiatric evaluation follow-up, chart reviewed. Reviewed the information documented and agree with the treatment plan.   Treatment Plan Summary: Daily contact with patient to assess and evaluate symptoms and progress in treatment and Medication management    Will continue today 04/14/17 plan as below except where it is noted.  -Continue Depakote ER 750 mg po qhs for mood sx. -Depakote level therapeutic on admission. -Continue Haldol to 10 mg po bid for psychosis. -Haldol decanoate IM 150 mg - received 04/05/2017 - repeat q30 days. -Discontinued unit restriction on 04/08/2017 -Discontinue Ativan - unknown if this is causing cognitive issues. -Will continue Ambien 5 mg po qhs for insomnia. -Order UA /Urine culture- result pending  -CSW will continue to work on disposition. Will follow-up with social worker for ACT assessment and group home placement  Derrill [place], NP 04/14/2017, 11:05 AM

## 2017-04-14 NOTE — Progress Notes (Signed)
D:  Patient denied SI and HI while talking to nurse, contracts for safety.  Denied A/V hallucinations.  Denied pain. A:  Medications administered per MD orders.  Emotional support and encouragement given patient. R:  Safety maintained with 15 minute checks.  

## 2017-04-14 NOTE — [facility] Group Notes (Signed)
[place] [facility] LCSW Group Therapy Note  Date/Time:  04/14/2017  11:00AM-12:00PM  Type of Therapy and Topic:  Group Therapy:  Music and Mood  Participation Level:  Minimal   Description of Group: In this process group, members listened to a variety of genres of music and identified that different types of music evoke different responses.  Patients were encouraged to identify music that was soothing for them and music that was energizing for them.  Patients discussed how this knowledge can help with wellness and recovery in various ways including managing depression and anxiety as well as encouraging healthy sleep habits.    Therapeutic Goals: 1. Patients will explore the impact of different varieties of music on mood 2. Patients will verbalize the thoughts they have when listening to different types of music 3. Patients will identify music that is soothing to them as well as music that is energizing to them 4. Patients will discuss how to use this knowledge to assist in maintaining wellness and recovery 5. Patients will explore the use of music as a coping skill  Summary of Patient Progress:  At the beginning of group, patient expressed feeling ready to go home.  She sat in the room for about the first 45 minutes of group and rocked herself constantly and forcefully.  She did not seem to hear the music being played and did not talk at all prior to leaving eventually.  Therapeutic Modalities: Solution Focused Brief Therapy Motivational Interviewing Activity   Selmer Dominion, LCSW 04/14/2017 8:28 AM

## 2017-04-14 NOTE — Progress Notes (Signed)
D: Pt denies SI/HI/AVH. Pt is very emotional this evening, pt stated she thought she was going home. Pt proceeded to refuse her Depakote, "I don't take Depakote, I haven't taken Depakote since coming here"  A: Pt was offered support and encouragement. Pt was given medications. Pt was encourage to attend groups. Q 15 minute checks were done for safety.   R: safety maintained on unit.

## 2017-04-14 NOTE — Plan of Care (Signed)
Problem: Coping: Goal: Ability to cope will improve Outcome: Progressing Nurse discussed depression/anxiety/coping skills with patient.

## 2017-04-15 NOTE — Progress Notes (Signed)
[facility] MD Progress Note  04/15/2017 2:50 PM [name] Ball  MRN:  [id number]    Subjective: [name] Ball was evaluated by MD and NP. Patient continues to be pleasant and cooperative, her mood and affect has improved since admission. Patient presents less disorganized and tangential during  this assessment. Patient continues to be redirectable with conversation at times.  Depakote  Level to be collected on 04/16/2017 CWS to follow-up with ACT for reassessment of patient's behavioral and baseline.   Support, encouragement and reassurance was provided.  Principal Problem: Bipolar disorder, current episode manic, severe with psychotic features ([place]) Diagnosis:   Patient Active Problem List   Diagnosis Date Noted  . Bipolar disorder, current episode manic, severe with psychotic features ([place]) [F31.2] 04/03/2017  . Hepatitis C virus infection without hepatic coma [B19.20]   . Bipolar disorder ([place]) [F31.9]   . GERD (gastroesophageal reflux disease) [K21.9]    Total Time spent with patient: 15 minutes  Past Psychiatric History: Please see H&P.  Past Medical History:  Past Medical History:  Diagnosis Date  . Bipolar disorder ([place])   . GERD (gastroesophageal reflux disease)   . Hepatitis C virus infection without hepatic coma     Past Surgical History:  Procedure Laterality Date  . abdominal cyst removed    . CESAREAN SECTION    . COLON SURGERY     Family History: Please see H&P.  Family Psychiatric  History: Please see H&P.  Social History: Please see H&P.  History  Alcohol Use No     History  Drug Use No    Social History   Social History  . Marital status: Unknown    Spouse name: N/A  . Number of children: N/A  . Years of education: N/A   Social History Main Topics  . Smoking status: Current Every Day Smoker  . Smokeless tobacco: Never Used  . Alcohol use No  . Drug use: No  . Sexual activity: Not Asked   Other Topics Concern  . None   Social History Narrative   . None   Additional Social History:    Pain Medications: See MAR Prescriptions: See MAR Over the Counter: See MAR History of alcohol / drug use?: No history of alcohol / drug abuse Longest period of sobriety (when/how long): NA  Sleep: Fair  Appetite:  Fair  Current Medications: Current Facility-Administered Medications  Medication Dose Route Frequency Provider Last Rate Last Dose  . benztropine (COGENTIN) tablet 0.5 mg  0.5 mg Oral BID Rankin, Shuvon B, NP   0.5 mg at 04/15/17 0801  . diltiazem (DILACOR XR) 24 hr capsule 120 mg  120 mg Oral Daily Rankin, Shuvon B, NP   120 mg at 04/15/17 0801  . divalproex (DEPAKOTE) DR tablet 750 mg  750 mg Oral QHS Rankin, Shuvon B, NP   750 mg at 04/13/17 2333  . haloperidol (HALDOL) tablet 10 mg  10 mg Oral BID Ursula Alert, MD   10 mg at 04/15/17 0801  . [START ON 05/06/2017] haloperidol decanoate (HALDOL DECANOATE) 100 MG/ML injection 150 mg  150 mg Intramuscular Q30 days Eappen, Ria Clock, MD      . ibuprofen (ADVIL,MOTRIN) tablet 200-400 mg  200-400 mg Oral Q4H PRN Rankin, Shuvon B, NP   400 mg at 04/14/17 2123  . loperamide (IMODIUM) capsule 2 mg  2 mg Oral PRN Lindon Romp A, NP      . magnesium hydroxide (MILK OF MAGNESIA) suspension 30 mL  30 mL Oral Daily PRN Rankin, Shuvon  B, NP      . nicotine (NICODERM CQ - dosed in mg/24 hr) patch 7 mg  7 mg Transdermal Daily Patriciaann Clan E, PA-C   7 mg at 04/15/17 0801  . OLANZapine zydis (ZYPREXA) disintegrating tablet 5 mg  5 mg Oral BID PRN Ursula Alert, MD       Or  . OLANZapine (ZYPREXA) injection 5 mg  5 mg Intramuscular BID PRN Eappen, Saramma, MD      . ondansetron (ZOFRAN) tablet 4 mg  4 mg Oral Q8H PRN Rankin, Shuvon B, NP      . pantoprazole (PROTONIX) EC tablet 40 mg  40 mg Oral Daily Rankin, Shuvon B, NP   40 mg at 04/15/17 0801  . zolpidem (AMBIEN) tablet 5 mg  5 mg Oral QHS PRN Cobos, Myer Peer, MD   5 mg at 04/14/17 2123   Lab Results:  No results found for this or any  previous visit (from the past 48 hour(s)).  Blood Alcohol level:  Lab Results  Component Value Date   ETH <5 03/31/2017   ETH <5 96/78/9381   Metabolic Disorder Labs: Lab Results  Component Value Date   HGBA1C 4.6 (L) 04/05/2017   MPG 85.32 04/05/2017   Lab Results  Component Value Date   PROLACTIN 50.6 (H) 04/05/2017   Lab Results  Component Value Date   CHOL 130 04/05/2017   TRIG 51 04/05/2017   HDL 57 04/05/2017   CHOLHDL 2.3 04/05/2017   VLDL 10 04/05/2017   LDLCALC 63 04/05/2017   LDLCALC 89 05/03/2012   Physical Findings: AIMS: Facial and Oral Movements Muscles of Facial Expression: None, normal Lips and Perioral Area: None, normal Jaw: None, normal Tongue: None, normal,Extremity Movements Upper (arms, wrists, hands, fingers): None, normal Lower (legs, knees, ankles, toes): None, normal, Trunk Movements Neck, shoulders, hips: None, normal, Overall Severity Severity of abnormal movements (highest score from questions above): None, normal Incapacitation due to abnormal movements: None, normal Patient's awareness of abnormal movements (rate only patient's report): No Awareness, Dental Status Current problems with teeth and/or dentures?: No Does patient usually wear dentures?: No  CIWA:  CIWA-Ar Total: 1 COWS:  COWS Total Score: 2  Musculoskeletal: Strength & Muscle Tone: within normal limits Gait & Station: normal Patient leans: N/A  Psychiatric Specialty Exam: Physical Exam  Nursing note and vitals reviewed. Constitutional: She appears well-developed.  Neurological: She is alert.  Psychiatric: She has a normal mood and affect.    Review of Systems  Psychiatric/Behavioral: The patient is nervous/anxious (improving ).     Blood pressure 106/66, pulse (!) 105, temperature 98.5 F (36.9 C), temperature source Oral, resp. rate 16, height 5\' 6"  (1.676 m), weight 81.6 kg (179 lb 14.3 oz), SpO2 98 %.Body mass index is 29.04 kg/m.  General Appearance: Casual  smiling, cooperative and  pleasant  Eye Contact:  Fair  Speech:  Normal Rate   Volume:  Normal  Mood:  Anxious and Depressed  Affect:  Labile  Thought Process:  Disorganized and Irrelevant- improving   Orientation:  Other:  self  Thought Content:  Delusions, Hallucinations: Auditory, Paranoid Ideation, Rumination and Tangential  Suicidal Thoughts:  No  Homicidal Thoughts:  No  Memory:  Immediate;   unable to assess Recent;   Poor Remote;   Poor  Judgement:  Poor  Insight:  Shallow  Psychomotor Activity:  Normal patient is visible on the unit   Concentration:  Concentration: Poor and Attention Span: Poor  Recall:  Poor  Fund of Knowledge:  Poor  Language:  Fair  Akathisia:  No  Handed:  Right  AIMS (if indicated):     Assets:  Communication Skills Desire for Improvement Resilience Social Support  ADL's:  Intact  Cognition:  Impaired,  Mild  Sleep:  Number of Hours: 5.5     I agree with current treatment plan on 04/15/2017, Patient seen face-to-face for psychiatric evaluation follow-up, chart reviewed. Reviewed the information documented and agree with the treatment plan.   Treatment Plan Summary: Daily contact with patient to assess and evaluate symptoms and progress in treatment and Medication management    Will continue today 04/15/17 plan as below except where it is noted.  -Continue Depakote ER 750 mg po qhs for mood sx.  -Depakote level therapeutic on admission.- Depakote level to be collected 04/15/2017 -Continue Haldol to 10 mg po bid for psychosis. -Haldol decanoate IM 150 mg - received 04/05/2017 - repeat q30 days.  -Discontinue Ativan - unknown if this is causing cognitive issues. -Will continue Ambien 5 mg po qhs for insomnia. -CSW will continue to work on disposition. Will follow-up with social worker for ACT assessment and group home placement  Derrill [place], NP 04/15/2017, 2:50 PM Agree with NP Progress Note

## 2017-04-15 NOTE — [facility] Group Notes (Signed)
LCSW Group Therapy Note   04/15/2017 1:15pm   Type of Therapy and Topic:  Group Therapy:  Overcoming Obstacles   Participation Level:  Minimal   Description of Group:    In this group patients will be encouraged to explore what they see as obstacles to their own wellness and recovery. They will be guided to discuss their thoughts, feelings, and behaviors related to these obstacles. The group will process together ways to cope with barriers, with attention given to specific choices patients can make. Each patient will be challenged to identify changes they are motivated to make in order to overcome their obstacles. This group will be process-oriented, with patients participating in exploration of their own experiences as well as giving and receiving support and challenge from other group members.   Therapeutic Goals: 1. Patient will identify personal and current obstacles as they relate to admission. 2. Patient will identify barriers that currently interfere with their wellness or overcoming obstacles.  3. Patient will identify feelings, thought process and behaviors related to these barriers. 4. Patient will identify two changes they are willing to make to overcome these obstacles:      Summary of Patient Progress Patient participated in a group exercise which identified challenges and identified positive/negative verbal, cognitive and behavioral responses.  Patient participated in a mindfulness exercise and a game designed to encourage making choices.  Stayed in group for first few minutes; however was focused on "my family is coming to get me, I have seen them drop off my clothes, I want to go home."  Patient is not up fr discharge at this time, expressed disappointment that she is not discharging.       Therapeutic Modalities:   Cognitive Behavioral Therapy Solution Focused Therapy Motivational Interviewing Relapse Prevention Therapy  Beverely Pace, LCSW 04/15/2017 1:11 PM

## 2017-04-15 NOTE — Plan of Care (Signed)
Problem: Safety: Goal: Ability to remain free from injury will improve Outcome: Progressing Patient is safe and free from injury.  Routine safety checks maintained every 15 minutes.

## 2017-04-15 NOTE — Plan of Care (Signed)
Problem: Safety: Goal: Periods of time without injury will increase Outcome: Progressing Pt safe on the unit at this time   

## 2017-04-15 NOTE — Progress Notes (Signed)
DAR NOTE: Patient presents with anxious affect and mood.  Denies pain, auditory and visual hallucinations.  Described energy level as normal and concentration as good.  Rates depression at 0, hopelessness at 0, and anxiety at 0.  Maintained on routine safety checks.  Medications given as prescribed.  Support and encouragement offered as needed.  Attended group and participated.  States goal for today is "discharge."  Patient observed socializing with peers in the dayroom.  Patient remained preoccupied with getting discharge.  Offered no complaint.

## 2017-04-15 NOTE — Progress Notes (Signed)
Recreation Therapy Notes  Date: 04/15/17 Time: 1000 Location: 500 Hall Dayroom  Group Topic: Goal Setting  Goal Area(s) Addresses:  Patient will be able to identify at least 3 life goals.   Patient will be able to identify benefit of investing in life goals.  Patient will be able to identify benefit of setting life goals.   Intervention: Worksheets, pens  Activity: Life Goals.  Patients were given a worksheet with six categories (family, friends, work/school, spirituality, body and mental health).  Patients were to explain what they were doing well, what they needed to improve and make a goal for each category.    Education:  Discharge Planning, Radiographer, therapeutic, Leisure Education   Education Outcome: Acknowledges Education/In Group Clarification Provided/Needs Additional Education  Clinical Observations: Pt did not attend group.   Victorino Sparrow, LRT/CTRS         Victorino Sparrow A 04/15/2017 12:43 PM

## 2017-04-15 NOTE — Progress Notes (Signed)
D: Pt denies SI/HI/AVH. Pt is pleasant and cooperative. Pt continues to be delusional, but pt is redirectable. Pt is very influential , so if someone else talks about something ( staff/ peer) pt starts to inquire about something of hers on the same subject .  A: Pt was offered support and encouragement. Pt was given scheduled medications. Pt was encourage to attend groups. Q 15 minute checks were done for safety.   R: Pt is taking medication. Pt has no complaints.Pt receptive to treatment and safety maintained on unit.

## 2017-04-16 LAB — CBC WITH DIFFERENTIAL/PLATELET
BASOS PCT: 0 %
Basophils Absolute: 0 10*3/uL (ref 0.0–0.1)
EOS ABS: 0.1 10*3/uL (ref 0.0–0.7)
EOS PCT: 2 %
HCT: 35.5 % — ABNORMAL LOW (ref 36.0–46.0)
HEMOGLOBIN: 11.6 g/dL — AB (ref 12.0–15.0)
Lymphocytes Relative: 35 %
Lymphs Abs: 2.5 10*3/uL (ref 0.7–4.0)
MCH: 29.8 pg (ref 26.0–34.0)
MCHC: 32.7 g/dL (ref 30.0–36.0)
MCV: 91.3 fL (ref 78.0–100.0)
Monocytes Absolute: 0.8 10*3/uL (ref 0.1–1.0)
Monocytes Relative: 11 %
NEUTROS PCT: 52 %
Neutro Abs: 3.8 10*3/uL (ref 1.7–7.7)
PLATELETS: 242 10*3/uL (ref 150–400)
RBC: 3.89 MIL/uL (ref 3.87–5.11)
RDW: 12.9 % (ref 11.5–15.5)
WBC: 7.2 10*3/uL (ref 4.0–10.5)

## 2017-04-16 LAB — BASIC METABOLIC PANEL
Anion gap: 11 (ref 5–15)
BUN: 7 mg/dL (ref 6–20)
CHLORIDE: 103 mmol/L (ref 101–111)
CO2: 25 mmol/L (ref 22–32)
CREATININE: 0.83 mg/dL (ref 0.44–1.00)
Calcium: 9 mg/dL (ref 8.9–10.3)
Glucose, Bld: 147 mg/dL — ABNORMAL HIGH (ref 65–99)
POTASSIUM: 4 mmol/L (ref 3.5–5.1)
SODIUM: 139 mmol/L (ref 135–145)

## 2017-04-16 LAB — VALPROIC ACID LEVEL: VALPROIC ACID LVL: 25 ug/mL — AB (ref 50.0–100.0)

## 2017-04-16 NOTE — [facility] Group Notes (Signed)

## 2017-04-16 NOTE — Progress Notes (Signed)
D: Pt denies SI/HI/AVH. Pt is pleasant and cooperative. Pt continues to be disorganized, with flight of ideas. Pt stated she was trying to get things together and put them in order, but when asked to elaborate pt stated they were private.   A: Pt was offered support and encouragement. Pt was given scheduled medications. Pt was encourage to attend groups. Q 15 minute checks were done for safety.   R: Pt is taking medication. Pt has no complaints.Pt receptive to treatment and safety maintained on unit.

## 2017-04-16 NOTE — Progress Notes (Signed)
Pt did not attend wrap up group this evening.  

## 2017-04-16 NOTE — Plan of Care (Signed)
Problem: Safety: Goal: Ability to remain free from injury will improve Outcome: Progressing Pt denies at this time, but appears to be responding to internal stimuli at thimes

## 2017-04-16 NOTE — Progress Notes (Signed)
Recreation Therapy Notes  Date: 04/16/17 Time: 1000 Location: 500 Hall Dayroom  Group Topic: Coping Skills  Goal Area(s) Addresses:  Patients will be able to identify positive coping skills. Patients will be able to identify the benefits of coping skills. Patients will be able to identify benefits of using coping skills post d/c.  Behavioral Response: None  Intervention: Worksheet, pens  Activity: Mind map.  Patients were given a blank mind map.  LRT and patients filled in the first eight boxes together.  Patients identified depression, anxiety, loneliness, homeless, anger, hearing voices, seeing things and overdose as things we need coping skills for.  Patients were to then come up with three coping skills for each of the eight problems identified.  Education: Radiographer, therapeutic, Dentist.   Education Outcome: Acknowledges understanding/In group clarification offered/Needs additional education.   Clinical Observations/Feedback: Pt sat in group and observed.  Pt was pleasant.     Victorino Sparrow, LRT/CTRS         Victorino Sparrow A 04/16/2017 12:05 PM

## 2017-04-16 NOTE — Plan of Care (Signed)
Problem: Medication: Goal: Compliance with prescribed medication regimen will improve Outcome: Progressing Patient is compliant with prescribed medication regimen.

## 2017-04-16 NOTE — Progress Notes (Addendum)
[facility] MD Progress Note  04/16/2017 10:52 AM [name] Ball  MRN:  [id number]   Subjective: patient reports " I am doing fine, thank you". She is focused on being discharged soon. She denies medication side effects. Objective : I have discussed case with treatment team and have met with patient. Compared to admission presentation, patient is much improved- she presents less significantly thought disordered, although remains tangential with open ended questions. She presents less restless, less disorganized in behavior, and at this time does not appear internally preoccupied . She is focused on being discharged soon. States she has a home in [place] she can go to, and at times also makes reference to going to a [employer] where she is being expected . Insight remains limited . However, when asked, she does express awareness she was living at [place] prior to admission and that she would return there after discharge. Denies medication side effects. Denies depression or neuro-vegetative symptoms of depression, and today presents euthymic.  Principal Problem: Bipolar disorder, current episode manic, severe with psychotic features ([place]) Diagnosis:   Patient Active Problem List   Diagnosis Date Noted  . Bipolar disorder, current episode manic, severe with psychotic features ([place]) [F31.2] 04/03/2017  . Hepatitis C virus infection without hepatic coma [B19.20]   . Bipolar disorder ([place]) [F31.9]   . GERD (gastroesophageal reflux disease) [K21.9]    Total Time spent with patient: 20 minutes  Past Psychiatric History: Please see H&P.  Past Medical History:  Past Medical History:  Diagnosis Date  . Bipolar disorder ([place])   . GERD (gastroesophageal reflux disease)   . Hepatitis C virus infection without hepatic coma     Past Surgical History:  Procedure Laterality Date  . abdominal cyst removed    . CESAREAN SECTION    . COLON SURGERY     Family History: Please see H&P.  Family  Psychiatric  History: Please see H&P.  Social History: Please see H&P.  History  Alcohol Use No     History  Drug Use No    Social History   Social History  . Marital status: Unknown    Spouse name: N/A  . Number of children: N/A  . Years of education: N/A   Social History Main Topics  . Smoking status: Current Every Day Smoker  . Smokeless tobacco: Never Used  . Alcohol use No  . Drug use: No  . Sexual activity: Not Asked   Other Topics Concern  . None   Social History Narrative  . None   Additional Social History:    Pain Medications: See MAR Prescriptions: See MAR Over the Counter: See MAR History of alcohol / drug use?: No history of alcohol / drug abuse Longest period of sobriety (when/how long): NA  Sleep: reports slept well last night  Appetite:  improved   Current Medications: Current Facility-Administered Medications  Medication Dose Route Frequency Provider Last Rate Last Dose  . benztropine (COGENTIN) tablet 0.5 mg  0.5 mg Oral BID Rankin, Shuvon B, NP   0.5 mg at 04/16/17 0820  . diltiazem (DILACOR XR) 24 hr capsule 120 mg  120 mg Oral Daily Rankin, Shuvon B, NP   120 mg at 04/16/17 0820  . divalproex (DEPAKOTE) DR tablet 750 mg  750 mg Oral QHS Rankin, Shuvon B, NP   750 mg at 04/15/17 2201  . haloperidol (HALDOL) tablet 10 mg  10 mg Oral BID Ursula Alert, MD   10 mg at 04/16/17 0820  . [  START ON 05/06/2017] haloperidol decanoate (HALDOL DECANOATE) 100 MG/ML injection 150 mg  150 mg Intramuscular Q30 days Eappen, Ria Clock, MD      . ibuprofen (ADVIL,MOTRIN) tablet 200-400 mg  200-400 mg Oral Q4H PRN Rankin, Shuvon B, NP   400 mg at 04/14/17 2123  . loperamide (IMODIUM) capsule 2 mg  2 mg Oral PRN Lindon Romp A, NP      . magnesium hydroxide (MILK OF MAGNESIA) suspension 30 mL  30 mL Oral Daily PRN Rankin, Shuvon B, NP      . nicotine (NICODERM CQ - dosed in mg/24 hr) patch 7 mg  7 mg Transdermal Daily Patriciaann Clan E, PA-C   7 mg at 04/15/17 0801   . OLANZapine zydis (ZYPREXA) disintegrating tablet 5 mg  5 mg Oral BID PRN Ursula Alert, MD   5 mg at 04/15/17 2202   Or  . OLANZapine (ZYPREXA) injection 5 mg  5 mg Intramuscular BID PRN Eappen, Saramma, MD      . ondansetron (ZOFRAN) tablet 4 mg  4 mg Oral Q8H PRN Rankin, Shuvon B, NP      . pantoprazole (PROTONIX) EC tablet 40 mg  40 mg Oral Daily Rankin, Shuvon B, NP   40 mg at 04/16/17 0820  . zolpidem (AMBIEN) tablet 5 mg  5 mg Oral QHS PRN Nadim Malia, Myer Peer, MD   5 mg at 04/15/17 2202   Lab Results:  No results found for this or any previous visit (from the past 48 hour(s)).  Blood Alcohol level:  Lab Results  Component Value Date   ETH <5 03/31/2017   ETH <5 78/24/2353   Metabolic Disorder Labs: Lab Results  Component Value Date   HGBA1C 4.6 (L) 04/05/2017   MPG 85.32 04/05/2017   Lab Results  Component Value Date   PROLACTIN 50.6 (H) 04/05/2017   Lab Results  Component Value Date   CHOL 130 04/05/2017   TRIG 51 04/05/2017   HDL 57 04/05/2017   CHOLHDL 2.3 04/05/2017   VLDL 10 04/05/2017   LDLCALC 63 04/05/2017   LDLCALC 89 05/03/2012   Physical Findings: AIMS: Facial and Oral Movements Muscles of Facial Expression: None, normal Lips and Perioral Area: None, normal Jaw: None, normal Tongue: None, normal,Extremity Movements Upper (arms, wrists, hands, fingers): None, normal Lower (legs, knees, ankles, toes): None, normal, Trunk Movements Neck, shoulders, hips: None, normal, Overall Severity Severity of abnormal movements (highest score from questions above): None, normal Incapacitation due to abnormal movements: None, normal Patient's awareness of abnormal movements (rate only patient's report): No Awareness, Dental Status Current problems with teeth and/or dentures?: No Does patient usually wear dentures?: No  CIWA:  CIWA-Ar Total: 1 COWS:  COWS Total Score: 2  Musculoskeletal: Strength & Muscle Tone: within normal limits Gait & Station:  normal Patient leans: N/A  Psychiatric Specialty Exam: Physical Exam  Nursing note and vitals reviewed. Constitutional: She appears well-developed.  Neurological: She is alert.  Psychiatric: She has a normal mood and affect.    Review of Systems  Psychiatric/Behavioral: The patient is nervous/anxious (improving ).   denies chest pain , denies shortness of breath, denies vomiting   Blood pressure (!) 118/54, pulse (!) 120, temperature 98.7 F (37.1 C), temperature source Oral, resp. rate 18, height '5\' 6"'$  (1.676 m), weight 81.6 kg (179 lb 14.3 oz), SpO2 98 %.Body mass index is 29.04 kg/m.  General Appearance: Fairly Groomed  Eye Contact:  Good  Speech:  Normal Rate   Volume:  Normal  Mood:  Euthymic  Affect:  pleasant, reactive, not irritable today  Thought Process:  Compared to admission much improved- less significantly thought disordered, less tangential  Orientation:  Fully alert and attentive, oriented to self, to [facility], to month and year   Thought Content:  denies hallucinations, no delusions are expressed   Suicidal Thoughts:  No denies suicidal or self injurious ideations, denies homicidal ideations   Homicidal Thoughts:  No  Memory:  Immediate recall 3/3 , 1/3 at 3 minutes   Judgement:  Poor  Insight:  limited   Psychomotor Activity:  Normal - not psycho-motorically agitated or restless at this time  Concentration:  Concentration: Fair and Attention Span: Fair  Recall:  [employer] of Knowledge:  Fair  Language:  Fair  Akathisia:  No  Handed:  Right  AIMS (if indicated):     Assets:  Communication Skills Desire for Improvement Resilience Social Support  ADL's:  Intact  Cognition:  Impaired,  Mild  Sleep:  Number of Hours: 6    Assessment - patient remains disorganized, psychotic, but denying mood/depression symptoms and presenting pleasant, cooperative . Although still disorganized , she is certainly improved compared to admission, and her behavior is not currently  overtly agitated or disruptive.     Treatment Plan Summary: Treatment Plan reviewed as below today 10/16 Daily contact with patient to assess and evaluate symptoms and progress in treatment and Medication management   Encourage group and milieu participation to work on coping skills/improvement Continue Depakote ER 750 mg po qhs for mood sx. Continue Haldol  10 mg po bid for psychosis. (Haldol decanoate IM 150 mg - received 04/05/2017 - repeat q30 days.) Continue Ambien 5 mg po qhs for insomnia. Treatment team working on disposition planning - I have reviewed with team and with CSW - plan is to request visit from her ACT team and from her [facility] staff to determine how close she is to baseline and appropriateness for discharge planning back to [facility] with ACT management .  Labs pending  Jenne Campus, MD 04/16/2017, 10:52 AM  Patient ID: [name] Ball, female   DOB: [birth year], 51 y.o.   MRN: [id number]

## 2017-04-17 DIAGNOSIS — F39 Unspecified mood [affective] disorder: Secondary | ICD-10-CM

## 2017-04-17 NOTE — Tx Team (Signed)
Interdisciplinary Treatment and Diagnostic Plan Update  04/17/2017 Time of Session: 3:39 PM  [name] Ball MRN: [id number]  Principal Diagnosis: Bipolar affective disorder, current episode, mania with psychotic features. Secondary Diagnoses: Principal Problem:   Bipolar disorder, current episode manic, severe with psychotic features ([name] Ball)   Current Medications:  Current Facility-Administered Medications  Medication Dose Route Frequency Provider Last Rate Last Dose  . benztropine (COGENTIN) tablet 0.5 mg  0.5 mg Oral BID Rankin, Shuvon B, NP   0.5 mg at 04/17/17 0831  . diltiazem (DILACOR XR) 24 hr capsule 120 mg  120 mg Oral Daily Rankin, Shuvon B, NP   120 mg at 04/17/17 0831  . divalproex (DEPAKOTE) DR tablet 750 mg  750 mg Oral QHS Rankin, Shuvon B, NP   750 mg at 04/16/17 2107  . haloperidol (HALDOL) tablet 10 mg  10 mg Oral BID Ursula Alert, MD   10 mg at 04/17/17 0831  . [START ON 05/06/2017] haloperidol decanoate (HALDOL DECANOATE) 100 MG/ML injection 150 mg  150 mg Intramuscular Q30 days Eappen, Ria Clock, MD      . ibuprofen (ADVIL,MOTRIN) tablet 200-400 mg  200-400 mg Oral Q4H PRN Rankin, Shuvon B, NP   400 mg at 04/14/17 2123  . loperamide (IMODIUM) capsule 2 mg  2 mg Oral PRN Lindon Romp A, NP      . magnesium hydroxide (MILK OF MAGNESIA) suspension 30 mL  30 mL Oral Daily PRN Rankin, Shuvon B, NP      . nicotine (NICODERM CQ - dosed in mg/24 hr) patch 7 mg  7 mg Transdermal Daily Patriciaann Clan E, PA-C   7 mg at 04/15/17 0801  . OLANZapine zydis (ZYPREXA) disintegrating tablet 5 mg  5 mg Oral BID PRN Ursula Alert, MD   5 mg at 04/15/17 2202   Or  . OLANZapine (ZYPREXA) injection 5 mg  5 mg Intramuscular BID PRN Eappen, Saramma, MD      . ondansetron (ZOFRAN) tablet 4 mg  4 mg Oral Q8H PRN Rankin, Shuvon B, NP      . pantoprazole (PROTONIX) EC tablet 40 mg  40 mg Oral Daily Rankin, Shuvon B, NP   40 mg at 04/17/17 0831  . zolpidem (AMBIEN) tablet 5 mg  5 mg Oral QHS PRN  Cobos, Myer Peer, MD   5 mg at 04/16/17 2107    PTA Medications: Prescriptions Prior to Admission  Medication Sig Dispense Refill Last Dose  . benztropine (COGENTIN) 0.5 MG tablet Take 0.5 mg by mouth 2 (two) times daily.   unknown  . diltiazem (DILACOR XR) 120 MG 24 hr capsule Take 120 mg by mouth daily.   unknown  . divalproex (DEPAKOTE) 250 MG DR tablet Take 750 mg by mouth at bedtime.    unknown  . haloperidol (HALDOL) 10 MG tablet Take 10 mg by mouth 3 (three) times daily.   unknown  . haloperidol decanoate (HALDOL DECANOATE) 100 MG/ML injection Inject 150 mg into the muscle every 28 (twenty-eight) days.    unknown  . ibuprofen (ADVIL,MOTRIN) 200 MG tablet Take 200-400 mg by mouth every 4 (four) hours as needed for moderate pain.    unknown  . LORazepam (ATIVAN) 1 MG tablet Take 1 mg by mouth every 6 (six) hours as needed for anxiety.   unknown  . pantoprazole (PROTONIX) 20 MG tablet Take 20 mg by mouth daily.   unknown  . QUEtiapine (SEROQUEL) 50 MG tablet Take 50 mg by mouth at bedtime.   unknown  . traZODone (  DESYREL) 100 MG tablet Take 100 mg by mouth at bedtime as needed for sleep.   unknown  . zolpidem (AMBIEN) 10 MG tablet Take 10 mg by mouth at bedtime.   unknown    Treatment Modalities: Medication Management, Group therapy, Case management,  1 to 1 session with clinician, Psychoeducation, Recreational therapy.  Patient Stressors: Medication change or noncompliance  Patient Strengths: Ability for insight Communication skills   Physician Treatment Plan for Primary Diagnosis: Bipolar affective disorder, current episode, mania with psychotic features. Long Term Goal(s): Improvement in symptoms so as ready for discharge  Short Term Goals: Ability to identify changes in lifestyle to reduce recurrence of condition will improve Ability to verbalize feelings will improve Ability to identify changes in lifestyle to reduce recurrence of condition will improve Ability to  verbalize feelings will improve Ability to demonstrate self-control will improve  Medication Management: Evaluate patient's response, side effects, and tolerance of medication regimen.  Therapeutic Interventions: 1 to 1 sessions, Unit Group sessions and Medication administration.  Evaluation of Outcomes: Progressing   10/12:  Pt is progressing slowly.  Able to respond appropriately to question before veering off into circumstantial thought.  Continue Depakote ER 750 mg po qhs for mood sx. -Depakote level therapeutic on admission. -Continue Haldol to 10 mg po bid for psychosis. -Haldol decanoate IM 150 mg - received 04/05/2017 - repeat q30 days. -Discontinued unit restriction on 04/08/2017 -Discontinue Ativan - unknown if this is causing cognitive issues. -Will continue Ambien 5 mg po qhs for insomnia.  10/17:  Pt has been visited by both ACT team [last Friday} and guardian {today]  Both are stating she is too delusional and easily upset to be able to return to [facility] at this juncture.  [place] coming to see her on Friday to give [place] feedback.  There have been no changes in medication since last tx plan review  Physician Treatment Plan for Secondary Diagnosis: Principal Problem:   Bipolar disorder, current episode manic, severe with psychotic features ([place])  Long Term Goal(s): Improvement in symptoms so as ready for discharge  Short Term Goals: Ability to identify changes in lifestyle to reduce recurrence of condition will improve Ability to verbalize feelings will improve Ability to identify changes in lifestyle to reduce recurrence of condition will improve Ability to verbalize feelings will improve Ability to demonstrate self-control will improve  Medication Management: Evaluate patient's response, side effects, and tolerance of medication regimen.  Therapeutic Interventions: 1 to 1 sessions, Unit Group sessions and Medication administration.  Evaluation of Outcomes: Progressing   RN  Treatment Plan for Primary Diagnosis: Bipolar affective disorder, current episode, mania with psychotic features. Long Term Goal(s): Knowledge of disease and therapeutic regimen to maintain health will improve  Short Term Goals: Ability to remain free from injury will improve, Ability to disclose and discuss suicidal ideas, Ability to identify and develop effective coping behaviors will improve and Compliance with prescribed medications will improve  Medication Management: RN will administer medications as ordered by provider, will assess and evaluate patient's response and provide education to patient for prescribed medication. RN will report any adverse and/or side effects to prescribing provider.  Therapeutic Interventions: 1 on 1 counseling sessions, Psychoeducation, Medication administration, Evaluate responses to treatment, Monitor vital signs and CBGs as ordered, Perform/monitor CIWA, COWS, AIMS and Fall Risk screenings as ordered, Perform wound care treatments as ordered.  Evaluation of Outcomes: Progressing   LCSW Treatment Plan for Primary Diagnosis: Bipolar affective disorder, current episode, mania with psychotic features. Long  Term Goal(s): Safe transition to appropriate next level of care at discharge, Engage patient in therapeutic group addressing interpersonal concerns.  Short Term Goals: Engage patient in aftercare planning with referrals and resources, Facilitate acceptance of mental health diagnosis and concerns, Identify triggers associated with mental health/substance abuse issues and Increase skills for wellness and recovery  Therapeutic Interventions: Assess for all discharge needs, 1 to 1 time with Social worker, Explore available resources and support systems, Assess for adequacy in community support network, Educate family and significant other(s) on suicide prevention, Complete Psychosocial Assessment, Interpersonal group therapy.  Evaluation of Outcomes:  Progressing   Progress in Treatment: Attending groups: Yes Participating in groups: Yes Taking medication as prescribed: Yes Toleration of medication: Yes, no side effects reported at this time Family/Significant other contact made: Ludger Nutting [phone], guardian  Patient understands diagnosis: No, limited insight  Discussing patient identified problems/goals with staff: Yes Medical problems stabilized or resolved: Yes Denies suicidal/homicidal ideation: Yes Issues/concerns per patient self-inventory: None Other: N/A  New problem(s) identified: None identified at this time.   New Short Term/Long Term Goal(s):  Guardian, Symphanie Uvaldo Rising, wants Jerlyn to receive help with "Medication stabilization".   Discharge Plan or Barriers: Pt will return to [place] , follow up [facility] team  Reason for Continuation of Hospitalization: Medication stabilization    Estimated Length of Stay: 10/22  Attendees: Patient:  04/17/2017  3:39 PM  Physician: Maris Berger, MD 04/17/2017  3:39 PM  Nursing: Jonette Mate, RN 04/17/2017  3:39 PM  RN Care Manager: Lars Pinks, RN 04/17/2017  3:39 PM  Social Worker: Ripley Fraise, [place]; Verdis Frederickson, Social Work Intern 04/17/2017  3:39 PM  Recreational Therapist: Victorino Sparrow, LRT 04/17/2017  3:39 PM  Other: Norberto Sorenson, [place] 04/17/2017  3:39 PM  Other:  04/17/2017  3:39 PM  Other: 04/17/2017  3:39 PM    Scribe for Treatment Team: Trish Mage, LCSW 04/17/2017 3:39 PM

## 2017-04-17 NOTE — Progress Notes (Signed)
Adult Psychoeducational Group Note  Date:  04/17/2017 Time:  8:32 PM  Group Topic/Focus:  Wrap-Up Group:   The focus of this group is to help patients review their daily goal of treatment and discuss progress on daily workbooks.  Participation Level:  Did Not Attend  Participation Quality:  Did not attend  Affect:  Did not attend  Cognitive:  Did not attend  Insight: None  Engagement in Group:  did not attend  Modes of Intervention:  did not attend  Additional Comments:    Sharmon Revere 04/17/2017, 8:32 PM

## 2017-04-17 NOTE — Progress Notes (Signed)
Patient ID: [name] Ball, female   DOB: 12/28/1965, 51 y.o.   MRN: [id number]  DAR: Pt. Denies SI/HI and A/V Hallucinations. She reports that her sleep last night was fair, her appetite is good, her energy level is normal, and concentration is good. She rates her depression "ok", hopelessness rating is "ok", and her anxiety is rated, "pain." Patient does not report any pain or discomfort at this time. Support and encouragement provided to the patient. Scheduled medications administered to patient per physician's orders. She reports that her goal for the day is "getting to live ok." She reports what she will do to meet her goal is "please rose." Patient continues to present as disorganized. Her guardian came to visit her this afternoon. After the visit with the patient her guardian reported, "this is not baseline for her." She reports, "[name]) was asking me for heroin. Begging me to give her heroin." Her guardian reports that at this time she does not believe that patient is at her baseline and feels that the group home may not take patient back. Writer contacted Rod N LCSW to relay the information that patient's guardian gave. Patient is seen in the milieu intermittently. At times she is seen sitting in a chair rocking back and forth. She does laugh inappropriately at times. Q15 minute checks are maintained for safety.

## 2017-04-17 NOTE — B[facility] Group Notes (Signed)
LCSW Group Therapy Note  04/17/2017 1:15pm  Type of Therapy/Topic:  Group Therapy:  Balance in Life  Participation Level:  Minimal  Description of Group:    This group will address the concept of balance and how it feels and looks when one is unbalanced. Patients will be encouraged to process areas in their lives that are out of balance and identify reasons for remaining unbalanced. Facilitators will guide patients in utilizing problem-solving interventions to address and correct the stressor making their life unbalanced. Understanding and applying boundaries will be explored and addressed for obtaining and maintaining a balanced life. Patients will be encouraged to explore ways to assertively make their unbalanced needs known to significant others in their lives, using other group members and facilitator for support and feedback.  Therapeutic Goals: 1. Patient will identify two or more emotions or situations they have that consume much of in their lives. 2. Patient will identify signs/triggers that life has become out of balance:  3. Patient will identify two ways to set boundaries in order to achieve balance in their lives:  4. Patient will demonstrate ability to communicate their needs through discussion and/or role plays  Summary of Patient Progress:  Dempsey attended group but she was in and out of the room.  He was an active participant when she was in the group. She states that when she is angry she suppresses her emotions and does not express them. Towards the end of the group she began to cry.  She stated that she was upset that she had not expressed her need to go to a methadone clinic.      Therapeutic Modalities:   Cognitive Behavioral Therapy Solution-Focused Therapy Assertiveness Training  Darleen Crocker, Student-Social Work 04/17/2017 1:34 PM

## 2017-04-17 NOTE — Progress Notes (Signed)
Patient ID: [name] Ball, female   DOB: [birth year], 51 y.o.   MRN: [id number]  [facility] MD Progress Note  04/17/2017 6:53 PM [name]  MRN:  [id number]   Subjective: patient reports "I passed out before I got her because I wasn't getting my prescriptions". She is focused on being prescribed methadone or being given heroin while in the hospital.  She denies medication side effects.  Objective : I have discussed case with treatment team and have met with patient. Compared initial presentation, pt shows improvement of agitation and thought organization; however she requests for methadone or "heroin" to address her ongoing mood symptoms. She explains, "I was given every medication that doesn't have opiates in it, so now I  Want one." With discussion about choosing a safe, efficacious treatment- pt agrees to continue her current regimen without changes. Denies medication side effects. Denies depression or neuro-vegetative symptoms of depression, and today presents euthymic.  Principal Problem: Bipolar disorder, current episode manic, severe with psychotic features ([place]) Diagnosis:   Patient Active Problem List   Diagnosis Date Noted  . Bipolar disorder, current episode manic, severe with psychotic features ([place]) [F31.2] 04/03/2017  . Hepatitis C virus infection without hepatic coma [B19.20]   . Bipolar disorder ([place]) [F31.9]   . GERD (gastroesophageal reflux disease) [K21.9]    Total Time spent with patient: 20 minutes  Past Psychiatric History: Please see H&P.  Past Medical History:  Past Medical History:  Diagnosis Date  . Bipolar disorder ([place])   . GERD (gastroesophageal reflux disease)   . Hepatitis C virus infection without hepatic coma     Past Surgical History:  Procedure Laterality Date  . abdominal cyst removed    . CESAREAN SECTION    . COLON SURGERY     Family History: Please see H&P.  Family Psychiatric  History: Please see H&P.  Social History: Please see  H&P.  History  Alcohol Use No     History  Drug Use No    Social History   Social History  . Marital status: Unknown    Spouse name: N/A  . Number of children: N/A  . Years of education: N/A   Social History Main Topics  . Smoking status: Current Every Day Smoker  . Smokeless tobacco: Never Used  . Alcohol use No  . Drug use: No  . Sexual activity: Not Asked   Other Topics Concern  . None   Social History Narrative  . None   Additional Social History:    Pain Medications: See MAR Prescriptions: See MAR Over the Counter: See MAR History of alcohol / drug use?: No history of alcohol / drug abuse Longest period of sobriety (when/how long): NA  Sleep: reports slept well last night  Appetite:  improved   Current Medications: Current Facility-Administered Medications  Medication Dose Route Frequency Provider Last Rate Last Dose  . benztropine (COGENTIN) tablet 0.5 mg  0.5 mg Oral BID Rankin, Shuvon B, NP   0.5 mg at 04/17/17 1655  . diltiazem (DILACOR XR) 24 hr capsule 120 mg  120 mg Oral Daily Rankin, Shuvon B, NP   120 mg at 04/17/17 0831  . divalproex (DEPAKOTE) DR tablet 750 mg  750 mg Oral QHS Rankin, Shuvon B, NP   750 mg at 04/16/17 2107  . haloperidol (HALDOL) tablet 10 mg  10 mg Oral BID Ursula Alert, MD   10 mg at 04/17/17 1655  . [START ON 05/06/2017] haloperidol decanoate (HALDOL DECANOATE) 100 MG/ML injection 150  mg  150 mg Intramuscular Q30 days Ursula Alert, MD      . ibuprofen (ADVIL,MOTRIN) tablet 200-400 mg  200-400 mg Oral Q4H PRN Rankin, Shuvon B, NP   400 mg at 04/17/17 1811  . loperamide (IMODIUM) capsule 2 mg  2 mg Oral PRN Lindon Romp A, NP      . magnesium hydroxide (MILK OF MAGNESIA) suspension 30 mL  30 mL Oral Daily PRN Rankin, Shuvon B, NP      . nicotine (NICODERM CQ - dosed in mg/24 hr) patch 7 mg  7 mg Transdermal Daily Patriciaann Clan E, PA-C   7 mg at 04/15/17 0801  . OLANZapine zydis (ZYPREXA) disintegrating tablet 5 mg  5 mg Oral  BID PRN Ursula Alert, MD   5 mg at 04/15/17 2202   Or  . OLANZapine (ZYPREXA) injection 5 mg  5 mg Intramuscular BID PRN Eappen, Saramma, MD      . ondansetron (ZOFRAN) tablet 4 mg  4 mg Oral Q8H PRN Rankin, Shuvon B, NP      . pantoprazole (PROTONIX) EC tablet 40 mg  40 mg Oral Daily Rankin, Shuvon B, NP   40 mg at 04/17/17 0831  . zolpidem (AMBIEN) tablet 5 mg  5 mg Oral QHS PRN Cobos, Myer Peer, MD   5 mg at 04/16/17 2107   Lab Results:  Results for orders placed or performed during the hospital encounter of 04/03/17 (from the past 48 hour(s))  Valproic acid level     Status: Abnormal   Collection Time: 04/16/17  6:22 PM  Result Value Ref Range   Valproic Acid Lvl 25 (L) 50.0 - 100.0 ug/mL    Comment: Performed at [facility], [place] [address], [place], [place] [address]  CBC with Differential/Platelet     Status: Abnormal   Collection Time: 04/16/17  6:22 PM  Result Value Ref Range   WBC 7.2 4.0 - 10.5 K/uL   RBC 3.89 3.87 - 5.11 MIL/uL   Hemoglobin 11.6 (L) 12.0 - 15.0 g/dL   HCT 35.5 (L) 36.0 - 46.0 %   MCV 91.3 78.0 - 100.0 fL   MCH 29.8 26.0 - 34.0 pg   MCHC 32.7 30.0 - 36.0 g/dL   RDW 12.9 11.5 - 15.5 %   Platelets 242 150 - 400 K/uL   Neutrophils Relative % 52 %   Neutro Abs 3.8 1.7 - 7.7 K/uL   Lymphocytes Relative 35 %   Lymphs Abs 2.5 0.7 - 4.0 K/uL   Monocytes Relative 11 %   Monocytes Absolute 0.8 0.1 - 1.0 K/uL   Eosinophils Relative 2 %   Eosinophils Absolute 0.1 0.0 - 0.7 K/uL   Basophils Relative 0 %   Basophils Absolute 0.0 0.0 - 0.1 K/uL    Comment: Performed at [facility], [place] [address] [place], [place] [address]  Basic metabolic panel     Status: Abnormal   Collection Time: 04/16/17  6:22 PM  Result Value Ref Range   Sodium 139 135 - 145 mmol/L   Potassium 4.0 3.5 - 5.1 mmol/L   Chloride 103 101 - 111 mmol/L   CO2 25 22 - 32 mmol/L   Glucose, Bld 147 (H) 65 - 99 mg/dL   BUN 7 6 - 20 mg/dL   Creatinine,  Ser 0.83 0.44 - 1.00 mg/dL   Calcium 9.0 8.9 - 10.3 mg/dL   GFR calc non Af Amer >60 >60 mL/min   GFR calc Af Amer >60 >  60 mL/min    Comment: (NOTE) The eGFR has been calculated using the CKD EPI equation. This calculation has not been validated in all clinical situations. eGFR's persistently <60 mL/min signify possible Chronic Kidney Disease.    Anion gap 11 5 - 15    Comment: Performed at [facility], [place] [address] [place], [place] [address]    Blood Alcohol level:  Lab Results  Component Value Date   [facility] <5 03/31/2017   ETH <5 60/45/4098   Metabolic Disorder Labs: Lab Results  Component Value Date   HGBA1C 4.6 (L) 04/05/2017   MPG 85.32 04/05/2017   Lab Results  Component Value Date   PROLACTIN 50.6 (H) 04/05/2017   Lab Results  Component Value Date   CHOL 130 04/05/2017   TRIG 51 04/05/2017   HDL 57 04/05/2017   CHOLHDL 2.3 04/05/2017   VLDL 10 04/05/2017   LDLCALC 63 04/05/2017   LDLCALC 89 05/03/2012   Physical Findings: AIMS: Facial and Oral Movements Muscles of Facial Expression: None, normal Lips and Perioral Area: None, normal Jaw: None, normal Tongue: None, normal,Extremity Movements Upper (arms, wrists, hands, fingers): None, normal Lower (legs, knees, ankles, toes): None, normal, Trunk Movements Neck, shoulders, hips: None, normal, Overall Severity Severity of abnormal movements (highest score from questions above): None, normal Incapacitation due to abnormal movements: None, normal Patient's awareness of abnormal movements (rate only patient's report): No Awareness, Dental Status Current problems with teeth and/or dentures?: No Does patient usually wear dentures?: No  CIWA:  CIWA-Ar Total: 1 COWS:  COWS Total Score: 2  Musculoskeletal: Strength & Muscle Tone: within normal limits Gait & Station: normal Patient leans: N/A  Psychiatric Specialty Exam: Physical Exam  Nursing note and vitals reviewed. Constitutional: She  appears well-developed.  Neurological: She is alert.  Psychiatric: She has a normal mood and affect.    Review of Systems  Constitutional: Negative.   Respiratory: Negative.   Gastrointestinal: Negative.   Psychiatric/Behavioral: The patient is nervous/anxious (improving ).     Blood pressure 122/79, pulse 89, temperature 98 F (36.7 C), temperature source Oral, resp. rate 18, height 5' 6" (1.676 m), weight 81.6 kg (179 lb 14.3 oz), SpO2 98 %.Body mass index is 29.04 kg/m.  General Appearance: Fairly Groomed  Eye Contact:  Good  Speech:  Normal Rate   Volume:  Normal  Mood:  Euthymic  Affect:  pleasant, reactive, not irritable today  Thought Process: mildly disorganized and perseverates on opiates but improved as per comparison to initial presentation  Orientation:  Fully alert and attentive, oriented to self, to [facility], to month and year   Thought Content:  denies hallucinations, no delusions are expressed   Suicidal Thoughts:  No denies suicidal or self injurious ideations, denies homicidal ideations   Homicidal Thoughts:  No  Memory:  Immediate recall 3/3 , attention impaired as 5 minute recall is 0/3  Judgement:  Poor  Insight:  limited   Psychomotor Activity:  Normal - not psycho-motorically agitated or restless at this time  Concentration:  Concentration: Fair and Attention Span: Fair  Recall:  [employer] of Knowledge:  Fair  Language:  Fair  Akathisia:  No  Handed:  Right  AIMS (if indicated):     Assets:  Communication Skills Desire for Improvement Resilience Social Support  ADL's:  Intact  Cognition:  Impaired,  Mild  Sleep:  Number of Hours: 6.75    Assessment - patient remains disorganized, psychotic, but denying current mood/depression symptoms and  presenting pleasant, cooperative; however, she requests opiates to treat long-standing mood symptoms from childhood, but she is able to redirected to current treatment plan and is in agreement that it is helping since  she has been here..     Treatment Plan Summary: Treatment Plan reviewed as below today 10/16 Daily contact with patient to assess and evaluate symptoms and progress in treatment and Medication management   Encourage group and milieu participation to work on coping skills/improvement Continue Depakote ER 750 mg po qhs for mood sx. Continue Haldol  10 mg po bid for psychosis. (Haldol decanoate IM 150 mg - received 04/05/2017 - repeat q30 days.) Continue Ambien 5 mg po qhs for insomnia. Treatment team working on disposition planning - I have reviewed with team and with CSW - ACT team will continue to provide collateral as  Comparison for pt's baseline functioning.  Pennelope Bracken, MD 04/17/2017, 6:53 PM  Patient ID: [name], female   DOB: [birth year], 51 y.o.   MRN: [id number]

## 2017-04-17 NOTE — Progress Notes (Signed)
Recreation Therapy Notes  Date: 04/17/17 Time: 1000 Location: 500 Hall Dayroom  Group Topic: Communication, Team Building, Problem Solving  Goal Area(s) Addresses:  Patient will effectively work with peer towards shared goal.  Patient will identify skills used to make activity successful.  Patient will identify how skills used during activity can be used to reach post d/c goals.   Behavioral Response: None  Intervention: STEM Activity  Activity: Landing Pad. In teams patients were given 12 plastic drinking straws and a length of masking tape. Using the materials provided patients were asked to build a landing pad to catch a golf ball dropped from approximately 6 feet in the air.   Education: Education officer, community, Discharge Planning   Education Outcome: Acknowledges education/In group clarification offered/Needs additional education.   Clinical Observations/Feedback:  Pt did not participate in the activity.  Pt stated that she was ready to leave and that she was going to make better choices.    Victorino Sparrow, LRT/CTRS         Victorino Sparrow A 04/17/2017 12:40 PM

## 2017-04-18 NOTE — Progress Notes (Signed)
Patient ID: [name] Ball, female   DOB: 08-10-1965, 51 y.o.   MRN: [id number]  D: Patient is observed watching TV quietly on approach.  Pt mood and affect appeared  depressed and anxious. Pt reports she is doing well and tolerating medication. Denies  SI/HI/AVH and pain.No behavioral issues noted.  A: Support and encouragement offered as needed to express needs. Medications administered as prescribed.  R: Patient is safe and cooperative on unit. Will continue to monitor  for safety and stability.

## 2017-04-18 NOTE — Progress Notes (Signed)
Patient ID: [name] Ball, female   DOB: 18-Jul-1965, 51 y.o.   MRN: [id number]  [facility] MD Progress Note  04/18/2017 12:33 PM Brittni Hult  MRN:  [id number]   Subjective: patient is a 51 y/o F with worsening psychosis. Today she reports that she is feeling "better" as she prayed about her situation and found that she has come to peace with her situation. She describes wanting to return to her group home and to avoid illicit substances - though she has not been using any illicit substances recently, she has remote history of use. She denies SI/HI/AH/VH. She is clear, calm, and cooperative. Discussed with patient that her group home staff would be coming by tomorrow to evaluate her progress and appropriateness to return to her group home. Pt was in agreement to continue her current treatment regimen without changes. She had no further questions, comments, or concerns.  Objective : I have discussed case with treatment team and have met with patient. Pt continues to show improvement of agitation and thought organization. Her speech is clear and her thought process has improved organization. She is pleasant and linear during interview. With discussion about choosing a safe, efficacious treatment- pt agrees to continue her current regimen without changes. Denies medication side effects.Denies depression or neuro-vegetative symptoms of depression, and today presents euthymic.  Principal Problem: Bipolar disorder, current episode manic, severe with psychotic features ([place]) Diagnosis:   Patient Active Problem List   Diagnosis Date Noted  . Bipolar disorder, current episode manic, severe with psychotic features ([place]) [F31.2] 04/03/2017  . Hepatitis C virus infection without hepatic coma [B19.20]   . Bipolar disorder ([place]) [F31.9]   . GERD (gastroesophageal reflux disease) [K21.9]    Total Time spent with patient: 20 minutes  Past Psychiatric History: Please see H&P.  Past Medical History:  Past  Medical History:  Diagnosis Date  . Bipolar disorder ([place])   . GERD (gastroesophageal reflux disease)   . Hepatitis C virus infection without hepatic coma     Past Surgical History:  Procedure Laterality Date  . abdominal cyst removed    . CESAREAN SECTION    . COLON SURGERY     Family History: Please see H&P.  Family Psychiatric  History: Please see H&P.  Social History: Please see H&P.  History  Alcohol Use No     History  Drug Use No    Social History   Social History  . Marital status: Unknown    Spouse name: N/A  . Number of children: N/A  . Years of education: N/A   Social History Main Topics  . Smoking status: Current Every Day Smoker  . Smokeless tobacco: Never Used  . Alcohol use No  . Drug use: No  . Sexual activity: Not Asked   Other Topics Concern  . None   Social History Narrative  . None   Additional Social History:    Pain Medications: See MAR Prescriptions: See MAR Over the Counter: See MAR History of alcohol / drug use?: No history of alcohol / drug abuse Longest period of sobriety (when/how long): NA  Sleep: reports slept well last night  Appetite:  improved   Current Medications: Current Facility-Administered Medications  Medication Dose Route Frequency Provider Last Rate Last Dose  . benztropine (COGENTIN) tablet 0.5 mg  0.5 mg Oral BID Rankin, Shuvon B, NP   0.5 mg at 04/18/17 0750  . diltiazem (DILACOR XR) 24 hr capsule 120 mg  120 mg Oral Daily Rankin, Shuvon B,  NP   120 mg at 04/18/17 0749  . divalproex (DEPAKOTE) DR tablet 750 mg  750 mg Oral QHS Rankin, Shuvon B, NP   750 mg at 04/17/17 2134  . haloperidol (HALDOL) tablet 10 mg  10 mg Oral BID Ursula Alert, MD   10 mg at 04/18/17 0749  . [START ON 05/06/2017] haloperidol decanoate (HALDOL DECANOATE) 100 MG/ML injection 150 mg  150 mg Intramuscular Q30 days Eappen, Ria Clock, MD      . ibuprofen (ADVIL,MOTRIN) tablet 200-400 mg  200-400 mg Oral Q4H PRN Rankin, Shuvon B, NP    400 mg at 04/17/17 1811  . loperamide (IMODIUM) capsule 2 mg  2 mg Oral PRN Lindon Romp A, NP      . magnesium hydroxide (MILK OF MAGNESIA) suspension 30 mL  30 mL Oral Daily PRN Rankin, Shuvon B, NP      . nicotine (NICODERM CQ - dosed in mg/24 hr) patch 7 mg  7 mg Transdermal Daily Patriciaann Clan E, PA-C   7 mg at 04/18/17 0750  . OLANZapine zydis (ZYPREXA) disintegrating tablet 5 mg  5 mg Oral BID PRN Ursula Alert, MD   5 mg at 04/15/17 2202   Or  . OLANZapine (ZYPREXA) injection 5 mg  5 mg Intramuscular BID PRN Eappen, Saramma, MD      . ondansetron (ZOFRAN) tablet 4 mg  4 mg Oral Q8H PRN Rankin, Shuvon B, NP      . pantoprazole (PROTONIX) EC tablet 40 mg  40 mg Oral Daily Rankin, Shuvon B, NP   40 mg at 04/18/17 0749  . zolpidem (AMBIEN) tablet 5 mg  5 mg Oral QHS PRN Cobos, Myer Peer, MD   5 mg at 04/17/17 2134   Lab Results:  Results for orders placed or performed during the hospital encounter of 04/03/17 (from the past 48 hour(s))  Valproic acid level     Status: Abnormal   Collection Time: 04/16/17  6:22 PM  Result Value Ref Range   Valproic Acid Lvl 25 (L) 50.0 - 100.0 ug/mL    Comment: Performed at [facility], [place] [address] [place], [place] [address]  CBC with Differential/Platelet     Status: Abnormal   Collection Time: 04/16/17  6:22 PM  Result Value Ref Range   WBC 7.2 4.0 - 10.5 K/uL   RBC 3.89 3.87 - 5.11 MIL/uL   Hemoglobin 11.6 (L) 12.0 - 15.0 g/dL   HCT 35.5 (L) 36.0 - 46.0 %   MCV 91.3 78.0 - 100.0 fL   MCH 29.8 26.0 - 34.0 pg   MCHC 32.7 30.0 - 36.0 g/dL   RDW 12.9 11.5 - 15.5 %   Platelets 242 150 - 400 K/uL   Neutrophils Relative % 52 %   Neutro Abs 3.8 1.7 - 7.7 K/uL   Lymphocytes Relative 35 %   Lymphs Abs 2.5 0.7 - 4.0 K/uL   Monocytes Relative 11 %   Monocytes Absolute 0.8 0.1 - 1.0 K/uL   Eosinophils Relative 2 %   Eosinophils Absolute 0.1 0.0 - 0.7 K/uL   Basophils Relative 0 %   Basophils Absolute 0.0 0.0 - 0.1 K/uL     Comment: Performed at [facility], [place] [address], [place], [place] [address]  Basic metabolic panel     Status: Abnormal   Collection Time: 04/16/17  6:22 PM  Result Value Ref Range   Sodium 139 135 - 145 mmol/L   Potassium 4.0 3.5 - 5.1 mmol/L  Chloride 103 101 - 111 mmol/L   CO2 25 22 - 32 mmol/L   Glucose, Bld 147 (H) 65 - 99 mg/dL   BUN 7 6 - 20 mg/dL   Creatinine, Ser 0.83 0.44 - 1.00 mg/dL   Calcium 9.0 8.9 - 10.3 mg/dL   GFR calc non Af Amer >60 >60 mL/min   GFR calc Af Amer >60 >60 mL/min    Comment: (NOTE) The eGFR has been calculated using the CKD EPI equation. This calculation has not been validated in all clinical situations. eGFR's persistently <60 mL/min signify possible Chronic Kidney Disease.    Anion gap 11 5 - 15    Comment: Performed at [facility], [place] [address] [place], [place] [address]    Blood Alcohol level:  Lab Results  Component Value Date   [facility] <5 03/31/2017   ETH <5 44/81/8563   Metabolic Disorder Labs: Lab Results  Component Value Date   HGBA1C 4.6 (L) 04/05/2017   MPG 85.32 04/05/2017   Lab Results  Component Value Date   PROLACTIN 50.6 (H) 04/05/2017   Lab Results  Component Value Date   CHOL 130 04/05/2017   TRIG 51 04/05/2017   HDL 57 04/05/2017   CHOLHDL 2.3 04/05/2017   VLDL 10 04/05/2017   LDLCALC 63 04/05/2017   LDLCALC 89 05/03/2012   Physical Findings: AIMS: Facial and Oral Movements Muscles of Facial Expression: None, normal Lips and Perioral Area: None, normal Jaw: None, normal Tongue: None, normal,Extremity Movements Upper (arms, wrists, hands, fingers): None, normal Lower (legs, knees, ankles, toes): None, normal, Trunk Movements Neck, shoulders, hips: None, normal, Overall Severity Severity of abnormal movements (highest score from questions above): None, normal Incapacitation due to abnormal movements: None, normal Patient's awareness of abnormal movements (rate only  patient's report): No Awareness, Dental Status Current problems with teeth and/or dentures?: No Does patient usually wear dentures?: No  CIWA:  CIWA-Ar Total: 1 COWS:  COWS Total Score: 2  Musculoskeletal: Strength & Muscle Tone: within normal limits Gait & Station: normal Patient leans: N/A  Psychiatric Specialty Exam: Physical Exam  Nursing note and vitals reviewed. Neurological: She is alert.    Review of Systems  Constitutional: Negative for chills and fever.  Respiratory: Negative for cough and hemoptysis.   Cardiovascular: Negative for chest pain.  Gastrointestinal: Negative for heartburn, nausea and vomiting.    Blood pressure (!) 143/120, pulse 94, temperature 97.8 F (36.6 C), temperature source Oral, resp. rate 18, height '5\' 6"'$  (1.676 m), weight 81.6 kg (179 lb 14.3 oz), SpO2 98 %.Body mass index is 29.04 kg/m.  General Appearance: Fairly Groomed  Eye Contact:  Good  Speech:  Normal Rate   Volume:  Normal  Mood:  Euthymic  Affect:  pleasant, reactive, not irritable today  Thought Process: linear and goal directed  Orientation:  Fully alert and attentive, oriented to self, to [facility], to month and year   Thought Content:  denies hallucinations, no delusions are expressed   Suicidal Thoughts:  No denies suicidal or self injurious ideations, denies homicidal ideations   Homicidal Thoughts:  No  Memory:  Immediate recall 3/3  Judgement:  Poor  Insight:  limited   Psychomotor Activity:  Normal   Concentration:  Concentration: Fair and Attention Span: Fair  Recall:  [employer] of Knowledge:  Fair  Language:  Fair  Akathisia:  No  Handed:  Right  AIMS (if indicated):     Assets:  Communication Skills Desire for Improvement Resilience  Social Support  ADL's:  Intact  Cognition:  Impaired,  Mild  Sleep:  Number of Hours: 6.5    Assessment - patient remains disorganized, psychotic, but showing improvement daily and denying current mood/depression symptoms. Currently  she is presenting as pleasant and cooperative; She feels that she is nearly ready to return to her group home setting and there is arrangement for staff to come visit her tomorrow regarding and evaluation.   Treatment Plan Summary: Treatment Plan reviewed as below today 10/18 Daily contact with patient to assess and evaluate symptoms and progress in treatment and Medication management   Encourage group and milieu participation to work on coping skills/improvement Continue Depakote ER 750 mg po qhs for mood sx. Continue Haldol  10 mg po bid for psychosis. (Haldol decanoate IM 150 mg - received 04/05/2017 - repeat q30 days.) Continue Ambien 5 mg po qhs for insomnia. Treatment team working on disposition planning - I have reviewed with team and with CSW - ACT team will continue to provide collateral as comparison for pt's baseline functioning. Anticipate staff to evaluate pt on 04/19/17.   Pennelope Bracken, MD 04/18/2017, 12:33 PM  Patient ID: [name], female   DOB: Jul 09, 1965, 51 y.o.   MRN: [id number]

## 2017-04-18 NOTE — Progress Notes (Signed)
Patient ID: Brandye Inthavong, female   DOB: August 08, 1965, 51 y.o.   MRN: [id number]  D: Patient is observed watching TV quietly on approach.  Pt mood and affect appeared   anxious. Pt reports she is doing well and tolerating medication. Pt reports she is possibly discharging tomorrow. Denies  SI/HI/AVH and pain.No behavioral issues noted.  A: Support and encouragement offered as needed to express needs. Medications administered as prescribed.  R: Patient is safe and cooperative on unit. Will continue to monitor  for safety and stability.

## 2017-04-18 NOTE — Progress Notes (Signed)
Recreation Therapy Notes  Date: 04/18/17 Time: 1000 Location: 500 Hall Dayroom  Group Topic: Wellness  Goal Area(s) Addresses:  Patient will define components of whole wellness. Patient will verbalize benefit of whole wellness.  Behavioral Response: Minimal  Intervention:  2 Decks of Cards  Activity: Deck of Chance.  Patients were given 2 cards from one deck of cards.  LRT would pull a card from another deck of cards.  Whatever card LRT pulled, patients with the corresponding card would have to complete the associated exercise.  Education: Wellness, Dentist.   Education Outcome: Acknowledges education/In group clarification offered/Needs additional education.   Clinical Observations/Feedback: Pt completed two exercises.  Pt mainly sat in group, listened to the music and rocked back and forth in her chair   Aailyah Dunbar Ria Comment, LRT/CTRS         Victorino Sparrow A 04/18/2017 12:19 PM

## 2017-04-18 NOTE — Progress Notes (Signed)
Dar Note: patient is calm and appropriate to situation on the unit.  Denies suicidal thoughts, auditory and visual hallucinations.  Medication given as prescribed.  Routine safety checks maintained.  Described energy level as normal and concentration as good.  Patient still preoccupied with getting discharged.

## 2017-04-18 NOTE — Progress Notes (Signed)
Adult Psychoeducational Group Note  Date:  04/18/2017 Time:  9:07 PM   Group Topic/Focus:  Wrap-Up Group:   The focus of this group is to help patients review their daily goal of treatment and discuss progress on daily workbooks.  Participation Level:  Active  Participation Quality:  Appropriate  Affect:  Appropriate  Cognitive:  Alert  Insight: Appropriate  Engagement in Group:  Engaged  Modes of Intervention:  Discussion  Additional Comments:  Patient stated having a good day. Patient's goal for today was to receive messages from family. Patient met goal.   Greyson Riccardi L Hayla Hinger 04/18/2017, 9:07 PM

## 2017-04-18 NOTE — B[facility] Group Notes (Signed)
LCSW Group Therapy 04/18/2017 1:15pm  Type of Therapy and Topic:  Group Therapy:  Change and Accountability  Participation Level:  Did Not Attend  Description of Group In this group, patients discussed power and accountability for change.  The group identified the challenges related to accountability and the difficulty of accepting the outcomes of negative behaviors.  Patients were encouraged to openly discuss a challenge/change they could take responsibility for.  Patients discussed the use of "change talk" and positive thinking as ways to support achievement of personal goals.  The group discussed ways to give support and empowerment to peers.  Therapeutic Goals: 1. Patients will state the relationship between personal power and accountability in the change process 2. Patients will identify the positive and negative consequences of a personal choice they have made 3. Patients will identify one challenge/choice they will take responsibility for making 4. Patients will discuss the role of "change talk" and the impact of positive thinking as it supports successful personal change 5. Patients will verbalize support and affirmation of change efforts in peers  Summary of Patient Progress:    Therapeutic Modalities Solution Focused Brief Therapy Motivational Interviewing Cognitive Behavioral Therapy  [name] Ball Work 04/18/2017 1:50 PM

## 2017-04-19 MED ORDER — DIVALPROEX SODIUM 500 MG PO DR TAB
1000.0000 mg | DELAYED_RELEASE_TABLET | Freq: Every day | ORAL | Status: DC
  Administered 2017-04-19 – 2017-04-25 (×7): 1000 mg via ORAL
  Filled 2017-04-19 (×9): qty 2

## 2017-04-19 NOTE — B[facility] Group Notes (Signed)
LCSW Group Therapy Note   04/19/2017 1:15pm   Type of Therapy and Topic:  Group Therapy:  Positive Affirmations   Participation Level:  Minimal  Description of Group: This group addressed positive affirmation toward self and others. Patients went around the room and identified two positive things about themselves and two positive things about a peer in the room. Patients reflected on how it felt to share something positive with others, to identify positive things about themselves, and to hear positive things from others. Patients were encouraged to have a daily reflection of positive characteristics or circumstances.  Therapeutic Goals 1. Patient will verbalize two of their positive qualities 2. Patient will demonstrate empathy for others by stating two positive qualities about a peer in the group 3. Patient will verbalize their feelings when voicing positive self affirmations and when voicing positive affirmations of others 4. Patients will discuss the potential positive impact on their wellness/recovery of focusing on positive traits of self and others. Summary of Patient Progress:  She feels that courage is continuing and is always changing.  She shared that she was talking with the doctor and during that decided to be more open minded about about staying in the hospital for a few more days.  She was very talkative.  Her thoughts appeared to be on track for a longer period of time than it had been for the past week before becoming disorganized.  During the group she became agitated and began stating that she wants to go home and that the doctor will not allow her to leave. Her appearance was that of anxiety and frustration.  She did not choose a picture and instead chose to stare out the window.  When she was asked to share she made comments about the clothes some children had on in one picture but did not relate it to courage.  Therapeutic Modalities Cognitive Behavioral  Therapy Motivational Interviewing  Darleen Crocker, Student-Social Work 04/19/2017 1:20 PM

## 2017-04-19 NOTE — Progress Notes (Signed)
Patient ID: Ane Conerly, female   DOB: Oct 23, 1965, 51 y.o.   MRN: [id number]  [facility] MD Progress Note  04/19/2017 3:56 PM Dorthy Magnussen  MRN:  [id number]   Subjective: Carley reports, "I came here for physical, now I can't get out. I should not have come. It's gonna involve the cops & a lawyer to get me out of this place. Hey, girl, when they come to get me out of here, are you gonna follow & make sure I walk out of this place? I just want to get discharged. I got stuff that I got to do. I got a group home to go to. Can you look into it, girl? Thank you".  Objective: Vlasta is seen, chart reviewed. I have discussed case with treatment team and have met with patient.Pt continues to show some improvement of agitation and thought organization. Her speech is clearer, but remains tangential & circumstantial. Her thought process has remained disorganized. She is pleasant and linear during interview. With discussion about choosing a safe, efficacious treatment- pt agrees to continue her current regimen without changes. Denies medication side effects.Denies depression or neuro-vegetative symptoms of depression, and today presents euthymic. Pleasant on approach.  Principal Problem: Bipolar disorder, current episode manic, severe with psychotic features ([place]) Diagnosis:   Patient Active Problem List   Diagnosis Date Noted  . Bipolar disorder, current episode manic, severe with psychotic features ([place]) [F31.2] 04/03/2017  . Hepatitis C virus infection without hepatic coma [B19.20]   . Bipolar disorder ([place]) [F31.9]   . GERD (gastroesophageal reflux disease) [K21.9]    Total Time spent with patient: 20 minutes  Past Psychiatric History: Please see H&P.  Past Medical History:  Past Medical History:  Diagnosis Date  . Bipolar disorder ([place])   . GERD (gastroesophageal reflux disease)   . Hepatitis C virus infection without hepatic coma     Past Surgical History:  Procedure Laterality Date  .  abdominal cyst removed    . CESAREAN SECTION    . COLON SURGERY     Family History: Please see H&P.  Family Psychiatric  History: Please see H&P.  Social History: Please see H&P.  History  Alcohol Use No     History  Drug Use No    Social History   Social History  . Marital status: Unknown    Spouse name: N/A  . Number of children: N/A  . Years of education: N/A   Social History Main Topics  . Smoking status: Current Every Day Smoker  . Smokeless tobacco: Never Used  . Alcohol use No  . Drug use: No  . Sexual activity: Not Asked   Other Topics Concern  . None   Social History Narrative  . None   Additional Social History:  Pain Medications: See MAR Prescriptions: See MAR Over the Counter: See MAR History of alcohol / drug use?: No history of alcohol / drug abuse Longest period of sobriety (when/how long): NA  Sleep: Fair  Appetite:  improved   Current Medications: Current Facility-Administered Medications  Medication Dose Route Frequency Provider Last Rate Last Dose  . benztropine (COGENTIN) tablet 0.5 mg  0.5 mg Oral BID Rankin, Shuvon B, NP   0.5 mg at 04/19/17 0746  . diltiazem (DILACOR XR) 24 hr capsule 120 mg  120 mg Oral Daily Rankin, Shuvon B, NP   120 mg at 04/19/17 0746  . divalproex (DEPAKOTE) DR tablet 1,000 mg  1,000 mg Oral QHS Pennelope Bracken, MD      .  haloperidol (HALDOL) tablet 10 mg  10 mg Oral BID Ursula Alert, MD   10 mg at 04/19/17 0746  . [START ON 05/06/2017] haloperidol decanoate (HALDOL DECANOATE) 100 MG/ML injection 150 mg  150 mg Intramuscular Q30 days Eappen, Ria Clock, MD      . ibuprofen (ADVIL,MOTRIN) tablet 200-400 mg  200-400 mg Oral Q4H PRN Rankin, Shuvon B, NP   400 mg at 04/17/17 1811  . loperamide (IMODIUM) capsule 2 mg  2 mg Oral PRN Lindon Romp A, NP      . magnesium hydroxide (MILK OF MAGNESIA) suspension 30 mL  30 mL Oral Daily PRN Rankin, Shuvon B, NP      . nicotine (NICODERM CQ - dosed in mg/24 hr) patch  7 mg  7 mg Transdermal Daily Patriciaann Clan E, PA-C   7 mg at 04/19/17 0747  . OLANZapine zydis (ZYPREXA) disintegrating tablet 5 mg  5 mg Oral BID PRN Ursula Alert, MD   5 mg at 04/15/17 2202   Or  . OLANZapine (ZYPREXA) injection 5 mg  5 mg Intramuscular BID PRN Eappen, Saramma, MD      . ondansetron (ZOFRAN) tablet 4 mg  4 mg Oral Q8H PRN Rankin, Shuvon B, NP      . pantoprazole (PROTONIX) EC tablet 40 mg  40 mg Oral Daily Rankin, Shuvon B, NP   40 mg at 04/19/17 0746  . zolpidem (AMBIEN) tablet 5 mg  5 mg Oral QHS PRN Cobos, Myer Peer, MD   5 mg at 04/18/17 2131   Lab Results:  No results found for this or any previous visit (from the past 48 hour(s)).  Blood Alcohol level:  Lab Results  Component Value Date   ETH <5 03/31/2017   ETH <5 19/37/9024   Metabolic Disorder Labs: Lab Results  Component Value Date   HGBA1C 4.6 (L) 04/05/2017   MPG 85.32 04/05/2017   Lab Results  Component Value Date   PROLACTIN 50.6 (H) 04/05/2017   Lab Results  Component Value Date   CHOL 130 04/05/2017   TRIG 51 04/05/2017   HDL 57 04/05/2017   CHOLHDL 2.3 04/05/2017   VLDL 10 04/05/2017   LDLCALC 63 04/05/2017   LDLCALC 89 05/03/2012   Physical Findings: AIMS: Facial and Oral Movements Muscles of Facial Expression: None, normal Lips and Perioral Area: None, normal Jaw: None, normal Tongue: None, normal,Extremity Movements Upper (arms, wrists, hands, fingers): None, normal Lower (legs, knees, ankles, toes): None, normal, Trunk Movements Neck, shoulders, hips: None, normal, Overall Severity Severity of abnormal movements (highest score from questions above): None, normal Incapacitation due to abnormal movements: None, normal Patient's awareness of abnormal movements (rate only patient's report): No Awareness, Dental Status Current problems with teeth and/or dentures?: No Does patient usually wear dentures?: No  CIWA:  CIWA-Ar Total: 1 COWS:  COWS Total Score:  2  Musculoskeletal: Strength & Muscle Tone: within normal limits Gait & Station: normal Patient leans: N/A  Psychiatric Specialty Exam: Physical Exam  Nursing note and vitals reviewed. Neurological: She is alert.    Review of Systems  Constitutional: Negative for chills and fever.  Respiratory: Negative for cough and hemoptysis.   Cardiovascular: Negative for chest pain.  Gastrointestinal: Negative for heartburn, nausea and vomiting.    Blood pressure 115/72, pulse (!) 107, temperature 98.6 F (37 C), resp. rate 16, height 5' 6" (1.676 m), weight 81.6 kg (179 lb 14.3 oz), SpO2 98 %.Body mass index is 29.04 kg/m.  General Appearance: Fairly Groomed  Eye  Contact:  Good  Speech:  Pressured, tangential  Volume:  Normal  Mood:  Euthymic  Affect:  pleasant, reactive, not irritable today  Thought Process: Very disorganized.  Orientation:  Fully alert and attentive, oriented to self, to [facility], to month and year   Thought Content:  denies hallucinations, no delusions are expressed   Suicidal Thoughts:  No,  denies suicidal or self injurious ideations, denies homicidal ideations   Homicidal Thoughts:  No  Memory:  Immediate recall 3/3  Judgement:  Impaired  Insight:  limited , impaired  Psychomotor Activity:  Normal   Concentration:  Concentration: Poor and Attention Span: Poor  Recall:  [employer] of Knowledge:  Fair  Language:  Fair  Akathisia:  No  Handed:  Right  AIMS (if indicated):     Assets:  Communication Skills Desire for Improvement Resilience Social Support  ADL's:  Intact  Cognition:  Impaired,  Mild  Sleep:  Number of Hours: 4.25   Assessment - Patient remains disorganized, psychotic, but showing improvement daily and denying current mood/depression symptoms. Currently she is presenting as pleasant and cooperative; She feels that she is nearly ready to return to her group home setting and there is arrangement for staff to come visit her tomorrow regarding and  evaluation.  Treatment Plan Summary: Treatment Plan reviewed as below today 04/19/17 Daily contact with patient to assess and evaluate symptoms and progress in treatment and Medication management   Encourage group and milieu participation to work on coping skills/improvement Continue Depakote ER 750 mg po qhs for mood sx. Continue Haldol  10 mg po bid for psychosis. (Haldol decanoate IM 150 mg - received 04/05/2017 - repeat q30 days.) Continue Ambien 5 mg po qhs for insomnia. Treatment team working on disposition planning - I have reviewed with team and with CSW - ACT team will continue to provide collateral as comparison for pt's baseline functioning.   Encarnacion Slates, NP, PMHNP, FNP-BC 04/19/2017, 3:56 PM Patient ID: Devona Holmes, female   DOB: 06/21/1966, 51 y.o.   MRN: [id number]

## 2017-04-19 NOTE — Progress Notes (Signed)
Patient ID: [name] Ball, female   DOB: [birth year], 51 y.o.   MRN: [id number]  Pt up wondering to another patient room. Pt has been instructed and redirected to stay in her room. Will continue to monitor patient.

## 2017-04-19 NOTE — Progress Notes (Signed)
Recreation Therapy Notes  Date: 04/19/17 Time: 1000 Location: 500 Hall Dayroom  Group Topic: Leisure Education  Goal Area(s) Addresses:  Patient will identify positive leisure activities.  Patient will identify one positive benefit of participation in leisure activities.   Behavioral Response: Minimal  Intervention: Chairs, small beach ball  Activity: Keep It Going Volleyball.  LRT seated patients in a circle.  Patients were to toss the ball back and fourth to each other without letting the ball come to a stop.  Patients could bounce the ball off the floor but the wall was to remain moving at all times.  LRT would count the number of hits on the ball.  If the ball stopped the count would start over.  Education:  Leisure Education, Dentist  Education Outcome: Acknowledges education/In group clarification offered/Needs additional education  Clinical Observations/Feedback: Pt gave minimal participation.  Pt spent a lot of time looking out the window.   Victorino Sparrow, LRT/CTRS         Victorino Sparrow A 04/19/2017 12:20 PM

## 2017-04-19 NOTE — Plan of Care (Signed)
Problem: Safety: Goal: Periods of time without injury will increase Outcome: Progressing Patient is safe and free from injury.

## 2017-04-19 NOTE — Progress Notes (Signed)
Dar Note: Patient presents with anxious affect and mood.  Patient still disorganized and tangential at times.  Patient visible in the dayroom watching TV and interacting with staff and peers.  Patient still preoccupied with discharge plan.  Medication given as prescribed.  Reports depression as 0, hopelessness as 0, and anxiety as 0.  Denies pain, suicidal thoughts, auditory and visual hallucinations.  Routine safety checks maintained. Patient is safe on the unit.

## 2017-04-20 NOTE — Progress Notes (Signed)
D.  Pt pleasant on approach, complaint of some arm pain.  Pt remained in room most of evening.  Pt denies SI/HI/AVH at this time.  Pt came up and requested her night time medication a bit early so that she could go to bed.  A.  Support and encouragement offered, medication given as ordered.  R.  Pt remain safe on the unit, will continue to monitor.

## 2017-04-20 NOTE — Progress Notes (Signed)
Patient ID: [name] Ball, female   DOB: 17-Oct-1965, 51 y.o.   MRN: [id number]  D: Patient is observed watching TV quietly on approach.  Pt mood and affect appeared  depressed and anxious. Pt reports tolerating medication well. Pt got upset and starting crying and screaming in dayroom. Pt able to calm self down and went to her room to lay down. Denies  SI/HI/AVH and pain. A: Support and encouragement offered as needed to express needs. Medications administered as prescribed.  R: Patient is safe and cooperative on unit. Will continue to monitor  for safety and stability.

## 2017-04-20 NOTE — Progress Notes (Addendum)
[facility] MD Progress Note  04/20/2017 12:41 PM [name] Ball  MRN:  [id number] Subjective:  I think I am ready to be discharged.  My son can come and pick me up. Objective; patient seen chart reviewed.  Patient remains very irrational, irritable, easily agitated and disorganized.  She continued to endorse paranoia and her thought processes remains very tangential.  She is going to groups but sometime disruptive.  She admitted that she has seen a lot of psychiatrist in the past.  Her son lives in [place].  She is not sure how she admitted because she believe it was a conspiracy but now she is feeling better.  Principal Problem: Bipolar disorder, current episode manic, severe with psychotic features ([place]) Diagnosis:   Patient Active Problem List   Diagnosis Date Noted  . Bipolar disorder, current episode manic, severe with psychotic features ([place]) [F31.2] 04/03/2017  . Hepatitis C virus infection without hepatic coma [B19.20]   . Bipolar disorder ([place]) [F31.9]   . GERD (gastroesophageal reflux disease) [K21.9]    Total Time spent with patient: 20 minutes  Past Psychiatric History: Reviewed.  Past Medical History:  Past Medical History:  Diagnosis Date  . Bipolar disorder ([place])   . GERD (gastroesophageal reflux disease)   . Hepatitis C virus infection without hepatic coma     Past Surgical History:  Procedure Laterality Date  . abdominal cyst removed    . CESAREAN SECTION    . COLON SURGERY     Family History: History reviewed. No pertinent family history. Family Psychiatric  History: Reviewed. Social History:  History  Alcohol Use No     History  Drug Use No    Social History   Social History  . Marital status: Unknown    Spouse name: N/A  . Number of children: N/A  . Years of education: N/A   Social History Main Topics  . Smoking status: Current Every Day Smoker  . Smokeless tobacco: Never Used  . Alcohol use No  . Drug use: No  . Sexual activity: Not Asked    Other Topics Concern  . None   Social History Narrative  . None   Additional Social History:    Pain Medications: See MAR Prescriptions: See MAR Over the Counter: See MAR History of alcohol / drug use?: No history of alcohol / drug abuse Longest period of sobriety (when/how long): NA                    Sleep: Fair  Appetite:  Fair  Current Medications: Current Facility-Administered Medications  Medication Dose Route Frequency Provider Last Rate Last Dose  . benztropine (COGENTIN) tablet 0.5 mg  0.5 mg Oral BID Rankin, Shuvon B, NP   0.5 mg at 04/20/17 0853  . diltiazem (DILACOR XR) 24 hr capsule 120 mg  120 mg Oral Daily Rankin, Shuvon B, NP   120 mg at 04/20/17 0853  . divalproex (DEPAKOTE) DR tablet 1,000 mg  1,000 mg Oral QHS Maris Berger T, MD   1,000 mg at 04/19/17 2105  . haloperidol (HALDOL) tablet 10 mg  10 mg Oral BID Ursula Alert, MD   10 mg at 04/20/17 0853  . [START ON 05/06/2017] haloperidol decanoate (HALDOL DECANOATE) 100 MG/ML injection 150 mg  150 mg Intramuscular Q30 days Eappen, Ria Clock, MD      . ibuprofen (ADVIL,MOTRIN) tablet 200-400 mg  200-400 mg Oral Q4H PRN Rankin, Shuvon B, NP   400 mg at 04/17/17 1811  . loperamide (  IMODIUM) capsule 2 mg  2 mg Oral PRN Lindon Romp A, NP      . magnesium hydroxide (MILK OF MAGNESIA) suspension 30 mL  30 mL Oral Daily PRN Rankin, Shuvon B, NP      . nicotine (NICODERM CQ - dosed in mg/24 hr) patch 7 mg  7 mg Transdermal Daily Patriciaann Clan E, PA-C   7 mg at 04/19/17 0747  . OLANZapine zydis (ZYPREXA) disintegrating tablet 5 mg  5 mg Oral BID PRN Ursula Alert, MD   5 mg at 04/15/17 2202   Or  . OLANZapine (ZYPREXA) injection 5 mg  5 mg Intramuscular BID PRN Eappen, Saramma, MD      . ondansetron (ZOFRAN) tablet 4 mg  4 mg Oral Q8H PRN Rankin, Shuvon B, NP      . pantoprazole (PROTONIX) EC tablet 40 mg  40 mg Oral Daily Rankin, Shuvon B, NP   40 mg at 04/20/17 0853  . zolpidem (AMBIEN) tablet 5  mg  5 mg Oral QHS PRN Cobos, Myer Peer, MD   5 mg at 04/19/17 2105    Lab Results: No results found for this or any previous visit (from the past 48 hour(s)).  Blood Alcohol level:  Lab Results  Component Value Date   ETH <5 03/31/2017   ETH <5 40/04/2724    Metabolic Disorder Labs: Lab Results  Component Value Date   HGBA1C 4.6 (L) 04/05/2017   MPG 85.32 04/05/2017   Lab Results  Component Value Date   PROLACTIN 50.6 (H) 04/05/2017   Lab Results  Component Value Date   CHOL 130 04/05/2017   TRIG 51 04/05/2017   HDL 57 04/05/2017   CHOLHDL 2.3 04/05/2017   VLDL 10 04/05/2017   LDLCALC 63 04/05/2017   LDLCALC 89 05/03/2012    Physical Findings: AIMS: Facial and Oral Movements Muscles of Facial Expression: None, normal Lips and Perioral Area: None, normal Jaw: None, normal Tongue: None, normal,Extremity Movements Upper (arms, wrists, hands, fingers): None, normal Lower (legs, knees, ankles, toes): None, normal, Trunk Movements Neck, shoulders, hips: None, normal, Overall Severity Severity of abnormal movements (highest score from questions above): None, normal Incapacitation due to abnormal movements: None, normal Patient's awareness of abnormal movements (rate only patient's report): No Awareness, Dental Status Current problems with teeth and/or dentures?: No Does patient usually wear dentures?: No  CIWA:  CIWA-Ar Total: 1 COWS:  COWS Total Score: 2  Musculoskeletal: Strength & Muscle Tone: within normal limits Gait & Station: normal Patient leans: N/A  Psychiatric Specialty Exam: Physical Exam  Review of Systems  Constitutional: Negative.   HENT: Negative.   Respiratory: Negative.   Genitourinary: Negative.   Musculoskeletal: Negative.   Skin: Negative.   Neurological: Negative.     Blood pressure 119/81, pulse (!) 118, temperature 98.3 F (36.8 C), resp. rate 20, height 5\' 6"  (1.676 m), weight 81.6 kg (179 lb 14.3 oz), SpO2 98 %.Body mass index is  29.04 kg/m.  General Appearance: Fairly Groomed  Eye Contact:  Fair  Speech:  Pressured  Volume:  Increased  Mood:  Irritable  Affect:  Inappropriate and Labile  Thought Process:  Descriptions of Associations: Tangential  Orientation:  Full (Time, Place, and Person)  Thought Content:  Illogical and Paranoid Ideation  Suicidal Thoughts:  No  Homicidal Thoughts:  No  Memory:  Immediate;   Fair Recent;   Fair Remote;   Fair  Judgement:  Impaired  Insight:  Lacking  Psychomotor Activity:  Increased  Concentration:  Concentration: Fair and Attention Span: Poor  Recall:  [employer] of Knowledge:  Good  Language:  Good  Akathisia:  No  Handed:  Right  AIMS (if indicated):     Assets:  Desire for Improvement Housing  ADL's:  Intact  Cognition:  WNL  Sleep:  Number of Hours: 4.25     Treatment Plan Summary: Daily contact with patient to assess and evaluate symptoms and progress in treatment   Patient remains very disorganized, delusional and labile.  However she is taking her Depakote and Haldol.  She reported no side effects.  Continue Depakote ER 1000  mg at bedtime and Haldol 10 mg twice a day.  She has no tremors shakes or any EPS.  We will repeat Depakote level tomorrow.  Last level was subtherapeutic.  Continue Ambien 5 mg for insomnia.  Continue discharge planning.  Bri Wakeman T., MD 04/20/2017, 12:41 PM

## 2017-04-20 NOTE — Progress Notes (Signed)
DAR NOTE: Patient presents with anxious affect and depressed mood.  Denies pain, auditory and visual hallucinations.  Described energy level as normal and concentration as good.  Rates depression at 0, hopelessness at 0, and anxiety at 0.  Maintained on routine safety checks.  Medications given as prescribed.  Support and encouragement offered as needed.  Attended group and participated.  States goal for today is "home."  Patient remained preoccupied with discharge.  Visible in milieu for activities and group.  Offered no complaint.

## 2017-04-20 NOTE — [facility] Group Notes (Signed)
  [facility] LCSW Group Therapy Note  Date/Time:  04/20/2017 11:15AM-12:00PM  Type of Therapy and Topic:  Group Therapy:  Feelings About Hospitalization  Participation Level:  Active   Description of Group This process group involved patients discussing their feelings related to being hospitalized, as well as the benefits they see to being in the hospital.  These feelings and benefits were itemized.  The group then brainstormed specific ways in which they could seek those same benefits when they discharge and return home.  Therapeutic Goals 1. Patient will identify and describe positive and negative feelings related to hospitalization 2. Patient will verbalize benefits of hospitalization to themselves personally 3. Patients will brainstorm together ways they can obtain similar benefits in the outpatient setting, identify barriers to wellness and possible solutions  Summary of Patient Progress:  The patient expressed her primary feelings about being hospitalized are "mixed emotions" but overall she feels ready to go.  She displayed increased insight from the past few weeks, and stated that she feels a miracle is expected which is not going to happen.  She did continue to obsess about her delusions about her children.  Therapeutic Modalities Cognitive Behavioral Therapy Motivational Interviewing    Selmer Dominion, LCSW 04/20/2017, 8:58 AM

## 2017-04-21 LAB — VALPROIC ACID LEVEL: Valproic Acid Lvl: 74 ug/mL (ref 50.0–100.0)

## 2017-04-21 NOTE — Progress Notes (Signed)
[facility] MD Progress Note  04/21/2017 12:26 PM [name] Ball  MRN:  [id number] Subjective:  I'm doing better.  I'm sleeping better. Objective; patient seen chart reviewed.  Patient slowly and gradually improving from the past.  She is more organized and denies any irritability or anger.  She is going to the groups but usually quite.  She still had paranoia and her thought process remains circumstantial but overall she is sleeping better.  She has no tremors or shakes.  Her son lives in [place].  Her paranoia is slowly improving.  Her Depakote level today is 74.  She is tolerating her medication and denies any side effects.  Principal Problem: Bipolar disorder, current episode manic, severe with psychotic features ([place]) Diagnosis:   Patient Active Problem List   Diagnosis Date Noted  . Bipolar disorder, current episode manic, severe with psychotic features ([place]) [F31.2] 04/03/2017  . Hepatitis C virus infection without hepatic coma [B19.20]   . Bipolar disorder ([place]) [F31.9]   . GERD (gastroesophageal reflux disease) [K21.9]    Total Time spent with patient: 20 minutes  Past Psychiatric History: Reviewed.  Past Medical History:  Past Medical History:  Diagnosis Date  . Bipolar disorder ([place])   . GERD (gastroesophageal reflux disease)   . Hepatitis C virus infection without hepatic coma     Past Surgical History:  Procedure Laterality Date  . abdominal cyst removed    . CESAREAN SECTION    . COLON SURGERY     Family History: History reviewed. No pertinent family history. Family Psychiatric  History: Reviewed. Social History:  History  Alcohol Use No     History  Drug Use No    Social History   Social History  . Marital status: Unknown    Spouse name: N/A  . Number of children: N/A  . Years of education: N/A   Social History Main Topics  . Smoking status: Current Every Day Smoker  . Smokeless tobacco: Never Used  . Alcohol use No  . Drug use: No  . Sexual activity:  Not Asked   Other Topics Concern  . None   Social History Narrative  . None   Additional Social History:    Pain Medications: See MAR Prescriptions: See MAR Over the Counter: See MAR History of alcohol / drug use?: No history of alcohol / drug abuse Longest period of sobriety (when/how long): NA                    Sleep: Improved  Appetite:  Fair  Current Medications: Current Facility-Administered Medications  Medication Dose Route Frequency Provider Last Rate Last Dose  . benztropine (COGENTIN) tablet 0.5 mg  0.5 mg Oral BID Rankin, Shuvon B, NP   0.5 mg at 04/21/17 0806  . diltiazem (DILACOR XR) 24 hr capsule 120 mg  120 mg Oral Daily Rankin, Shuvon B, NP   120 mg at 04/20/17 0853  . divalproex (DEPAKOTE) DR tablet 1,000 mg  1,000 mg Oral QHS Maris Berger T, MD   1,000 mg at 04/20/17 2057  . haloperidol (HALDOL) tablet 10 mg  10 mg Oral BID Ursula Alert, MD   10 mg at 04/21/17 0806  . [START ON 05/06/2017] haloperidol decanoate (HALDOL DECANOATE) 100 MG/ML injection 150 mg  150 mg Intramuscular Q30 days Eappen, Ria Clock, MD      . ibuprofen (ADVIL,MOTRIN) tablet 200-400 mg  200-400 mg Oral Q4H PRN Rankin, Shuvon B, NP   400 mg at 04/20/17 2036  .  loperamide (IMODIUM) capsule 2 mg  2 mg Oral PRN Lindon Romp A, NP      . magnesium hydroxide (MILK OF MAGNESIA) suspension 30 mL  30 mL Oral Daily PRN Rankin, Shuvon B, NP      . nicotine (NICODERM CQ - dosed in mg/24 hr) patch 7 mg  7 mg Transdermal Daily Patriciaann Clan E, PA-C   7 mg at 04/19/17 0747  . OLANZapine zydis (ZYPREXA) disintegrating tablet 5 mg  5 mg Oral BID PRN Ursula Alert, MD   5 mg at 04/15/17 2202   Or  . OLANZapine (ZYPREXA) injection 5 mg  5 mg Intramuscular BID PRN Eappen, Saramma, MD      . ondansetron (ZOFRAN) tablet 4 mg  4 mg Oral Q8H PRN Rankin, Shuvon B, NP      . pantoprazole (PROTONIX) EC tablet 40 mg  40 mg Oral Daily Rankin, Shuvon B, NP   40 mg at 04/21/17 0806  . zolpidem  (AMBIEN) tablet 5 mg  5 mg Oral QHS PRN Cobos, Myer Peer, MD   5 mg at 04/20/17 2057    Lab Results:  Results for orders placed or performed during the hospital encounter of 04/03/17 (from the past 48 hour(s))  Valproic acid level     Status: None   Collection Time: 04/21/17  6:53 AM  Result Value Ref Range   Valproic Acid Lvl 74 50.0 - 100.0 ug/mL    Comment: Performed at [facility], [place] [address], [place], [place] [address]    Blood Alcohol level:  Lab Results  Component Value Date   [facility] <5 03/31/2017   ETH <5 47/82/9562    Metabolic Disorder Labs: Lab Results  Component Value Date   HGBA1C 4.6 (L) 04/05/2017   MPG 85.32 04/05/2017   Lab Results  Component Value Date   PROLACTIN 50.6 (H) 04/05/2017   Lab Results  Component Value Date   CHOL 130 04/05/2017   TRIG 51 04/05/2017   HDL 57 04/05/2017   CHOLHDL 2.3 04/05/2017   VLDL 10 04/05/2017   LDLCALC 63 04/05/2017   LDLCALC 89 05/03/2012    Physical Findings: AIMS: Facial and Oral Movements Muscles of Facial Expression: None, normal Lips and Perioral Area: None, normal Jaw: None, normal Tongue: None, normal,Extremity Movements Upper (arms, wrists, hands, fingers): None, normal Lower (legs, knees, ankles, toes): None, normal, Trunk Movements Neck, shoulders, hips: None, normal, Overall Severity Severity of abnormal movements (highest score from questions above): None, normal Incapacitation due to abnormal movements: None, normal Patient's awareness of abnormal movements (rate only patient's report): No Awareness, Dental Status Current problems with teeth and/or dentures?: No Does patient usually wear dentures?: No  CIWA:  CIWA-Ar Total: 1 COWS:  COWS Total Score: 2  Musculoskeletal: Strength & Muscle Tone: within normal limits Gait & Station: normal Patient leans: N/A  Psychiatric Specialty Exam: Physical Exam  Review of Systems  Constitutional: Negative.   HENT: Negative.    Respiratory: Negative.   Cardiovascular: Negative.   Musculoskeletal: Negative.   Neurological: Negative.  Negative for tremors.    Blood pressure (!) 115/51, pulse 86, temperature 98.8 F (37.1 C), temperature source Oral, resp. rate 16, height 5\' 6"  (1.676 m), weight 81.6 kg (179 lb 14.3 oz), SpO2 98 %.Body mass index is 29.04 kg/m.  General Appearance: Casual  Eye Contact:  Fair  Speech:  Clear and Coherent  Volume:  Normal  Mood:  Anxious  Affect:  Labile  Thought Process:  Descriptions of  Associations: Circumstantial  Orientation:  Full (Time, Place, and Person)  Thought Content:  Paranoid Ideation  Suicidal Thoughts:  No  Homicidal Thoughts:  No  Memory:  Immediate;   Fair Recent;   Fair Remote;   Fair  Judgement:  Fair  Insight:  Fair  Psychomotor Activity:  Normal  Concentration:  Concentration: Fair and Attention Span: Fair  Recall:  [employer] of Knowledge:  Good  Language:  Good  Akathisia:  No  Handed:  Right  AIMS (if indicated):     Assets:  Communication Skills Desire for Improvement Housing  ADL's:  Intact  Cognition:  WNL  Sleep:  Number of Hours: 6     Treatment Plan Summary: Daily contact with patient to assess and evaluate symptoms and progress in treatment  Patient slowly improving less disorganized.  She remains labile and continues to have circumstantial thought process but taking her medication.  Her Depakote level this morning is 74. Continue Depakote ER thousand milligram at bedtime and Haldol 10 mg twice a day.  She has no tremors, shakes or any EPS.  Continue Ambien 5 mg for insomnia.  Continue discharge planning.  Encouraged to participate in group milieu therapy.  Maxime Beckner T., MD 04/21/2017, 12:26 PM

## 2017-04-21 NOTE — Progress Notes (Signed)
[facility] Group Notes:  (Nursing/MHT/Case Management/Adjunct)  Date:  04/21/2017  Time:  9:28 PM  Type of Therapy:  Psychoeducational Skills  Participation Level:  Active  Participation Quality:  Appropriate  Affect:  Appropriate  Cognitive:  Appropriate  Insight:  Improving  Engagement in Group:  Improving  Modes of Intervention:  Education  Summary of Progress/Problems: Patient states that she had a good day and that she is "still learning and growing". She did not elaborate any further. In terms of the theme for the day, her support system will be comprised of her church friend and possibly her ex-husband.   Archie Balboa S 04/21/2017, 9:28 PM

## 2017-04-21 NOTE — Progress Notes (Signed)
Patient ID: [name] Ball, female   DOB: 1966-06-20, 51 y.o.   MRN: [id number]  DAR: Pt. Denies SI/HI and A/V Hallucinations. She reports that she is doing, "okay" today. She refused to fill out her daily inventory sheet today. Scheduled medications were administered to patient per physician's orders except for Diltiazem. Patient's BP has been low and therefore this medication was not given for safety reasons. Patient does not report any pain or discomfort at this time. Support and encouragement provided to the patient. Scheduled medications administered to patient per physician's orders. Patient is minimal with staff but cooperative. She is seen in the milieu intermittently. She does continue to present with some paranoia but this appears to be improving from last time writer assumed responsibility of this patient. Q15 minute checks are maintained for safety.

## 2017-04-21 NOTE — BHH Group Notes (Signed)
[facility] LCSW Group Therapy Note  Date/Time:  04/21/2017  11:00AM-12:00PM  Type of Therapy and Topic:  Group Therapy:  Music and Mood  Participation Level:  Active   Description of Group: In this process group, members listened to a variety of genres of music and identified that different types of music evoke different responses.  Patients were encouraged to identify music that was soothing for them and music that was energizing for them.  Patients discussed how this knowledge can help with wellness and recovery in various ways including managing depression and anxiety as well as encouraging healthy sleep habits.    Therapeutic Goals: 1. Patients will explore the impact of different varieties of music on mood 2. Patients will verbalize the thoughts they have when listening to different types of music 3. Patients will identify music that is soothing to them as well as music that is energizing to them 4. Patients will discuss how to use this knowledge to assist in maintaining wellness and recovery 5. Patients will explore the use of music as a coping skill  Summary of Patient Progress:  At the beginning of group, patient expressed feeling "disappointed because I'm ready to go."  At the end of the group she said she felt hopeful and different.  Her speech was at times still tangential and delusional, I.e. Saying that when she was admitted to the hospital she was in a coma.  Therapeutic Modalities: Solution Focused Brief Therapy Motivational Interviewing Activity   Selmer Dominion, LCSW 04/21/2017 9:20 AM

## 2017-04-21 NOTE — Progress Notes (Signed)
D.  Pt pleasant on approach, complaint of anxiety.  Pt was positive for evening wrap up group with appropriate participation.  Pt denies SI/HI/AVH at this time.  Pt went to bed shortly after group, requested medication for anxiety as well as headache pain.  A.  Support and encouragement offered, medication given as ordered and requested.  R.  Pt remains safe on the unit, will continue to monitor.

## 2017-04-21 NOTE — Progress Notes (Signed)
Pt up at back door in hall during night, attempted to open door.  Pt directed to return to her room, Pt replied "why?"  Pt continued to stand at the door for a time then hurriedly returned to her room carrying her blanket.  Will continue to closely monitor.

## 2017-04-22 MED ORDER — ARIPIPRAZOLE 15 MG PO TABS
15.0000 mg | ORAL_TABLET | Freq: Every day | ORAL | Status: DC
  Administered 2017-04-22 – 2017-04-24 (×3): 15 mg via ORAL
  Filled 2017-04-22 (×5): qty 1

## 2017-04-22 MED ORDER — HALOPERIDOL 5 MG PO TABS
5.0000 mg | ORAL_TABLET | Freq: Two times a day (BID) | ORAL | Status: DC
  Administered 2017-04-22 – 2017-04-26 (×8): 5 mg via ORAL
  Filled 2017-04-22 (×12): qty 1

## 2017-04-22 NOTE — BHH Group Notes (Signed)
LCSW Group Therapy Note   04/22/2017 1:15pm   Type of Therapy and Topic:  Group Therapy:  Overcoming Obstacles   Participation Level:  Active   Description of Group:    In this group patients will be encouraged to explore what they see as obstacles to their own wellness and recovery. They will be guided to discuss their thoughts, feelings, and behaviors related to these obstacles. The group will process together ways to cope with barriers, with attention given to specific choices patients can make. Each patient will be challenged to identify changes they are motivated to make in order to overcome their obstacles. This group will be process-oriented, with patients participating in exploration of their own experiences as well as giving and receiving support and challenge from other group members.   Therapeutic Goals: 1. Patient will identify personal and current obstacles as they relate to admission. 2. Patient will identify barriers that currently interfere with their wellness or overcoming obstacles.  3. Patient will identify feelings, thought process and behaviors related to these barriers. 4. Patient will identify two changes they are willing to make to overcome these obstacles:      Summary of Patient Progress   Stayed the entire time, interrupting throughout.  "Why do you talk about her leaving and not me?  I have been here the longest."  "Have you called my son to ask him when he is picking me up?  I gave you his number."  "I'm not getting the right medications.  I have anxiety and pain, and am not getting anything for that."    Therapeutic Modalities:   Cognitive Behavioral Therapy Solution Focused Therapy Motivational Interviewing Relapse Prevention [place], LCSW 04/22/2017 4:30 PM

## 2017-04-22 NOTE — Progress Notes (Signed)
Patient ID: [name] Ball, female   DOB: 1966/04/13, 51 y.o.   MRN: [id number]  DAR: Pt. Denies SI/HI and A/V Hallucinations. She reports that her sleep last night was good, her energy level is normal, her appetite is good, and her concentration is good. She rates her depression level 10/10, her hopelessness level 2/10, and and her anxiety level 10/10. At times she presents as mildly confused and disorganized in thought content. She is seen in the milieu intermittently. She is pleasant and cooperative with staff at this time. Support and encouragement provided to the patient. Scheduled medications administered to patient per physician's orders except for nicotine patch which patient refused this morning. Support and encouragement provided to the patient. Q15 minute checks are maintained for safety.

## 2017-04-22 NOTE — BHH Counselor (Signed)
Spoke with Mr [name] Ball, [facility] manager where [name] Ball was staying, [phone].  He, along with guardian, visited patient on Friday.  They both cite delusions and her stated unwillingness to return to the [facility] as reasons for need for continued stay.  He states she spoke at length about reuniting with her children, though she has had no contact with them for years.  He states she called the [facility] last night, and was talking about needing to collect her belongings so that she could go find her children.

## 2017-04-22 NOTE — Progress Notes (Signed)
Recreation Therapy Notes  Date: 04/22/17 Time: 1000 Location: 500 Hall Dayroom  Group Topic: Coping Skills  Goal Area(s) Addresses:  Patients will be able to identify positive coping skills. Patients will be able to identify the benefits of using coping skills post d/c.  Behavioral Response: None  Intervention: Psychologist, forensic, pencils  Activity: Building surveyor.  Patients were given a picture of a blank spider web.  Patients were to identify the things that have gotten them stuck and write them inside the web.  Patients were to then identify at least two coping skills for each situation they identified.  Education: Radiographer, therapeutic, Dentist.   Education Outcome: Acknowledges understanding/In group clarification offered/Needs additional education.   Clinical Observations/Feedback: Pt arrived for the last few minutes of group. Pt listened.   Victorino Sparrow, LRT/CTRS         Victorino Sparrow A 04/22/2017 12:17 PM

## 2017-04-22 NOTE — Tx Team (Signed)
Interdisciplinary Treatment and Diagnostic Plan Update  04/22/2017 Time of Session: 10:42 AM  [name] Ball MRN: [id number]  Principal Diagnosis: Bipolar affective disorder, current episode, mania with psychotic features. Secondary Diagnoses: Principal Problem:   Bipolar disorder, current episode manic, severe with psychotic features ([place])   Current Medications:  Current Facility-Administered Medications  Medication Dose Route Frequency Provider Last Rate Last Dose  . benztropine (COGENTIN) tablet 0.5 mg  0.5 mg Oral BID Rankin, Shuvon B, NP   0.5 mg at 04/22/17 0742  . diltiazem (DILACOR XR) 24 hr capsule 120 mg  120 mg Oral Daily Rankin, Shuvon B, NP   120 mg at 04/22/17 0743  . divalproex (DEPAKOTE) DR tablet 1,000 mg  1,000 mg Oral QHS Maris Berger T, MD   1,000 mg at 04/21/17 2056  . haloperidol (HALDOL) tablet 10 mg  10 mg Oral BID Ursula Alert, MD   10 mg at 04/22/17 0743  . [START ON 05/06/2017] haloperidol decanoate (HALDOL DECANOATE) 100 MG/ML injection 150 mg  150 mg Intramuscular Q30 days Eappen, Ria Clock, MD      . ibuprofen (ADVIL,MOTRIN) tablet 200-400 mg  200-400 mg Oral Q4H PRN Rankin, Shuvon B, NP   400 mg at 04/21/17 2056  . loperamide (IMODIUM) capsule 2 mg  2 mg Oral PRN Lindon Romp A, NP      . magnesium hydroxide (MILK OF MAGNESIA) suspension 30 mL  30 mL Oral Daily PRN Rankin, Shuvon B, NP      . nicotine (NICODERM CQ - dosed in mg/24 hr) patch 7 mg  7 mg Transdermal Daily Patriciaann Clan E, PA-C   7 mg at 04/19/17 0747  . OLANZapine zydis (ZYPREXA) disintegrating tablet 5 mg  5 mg Oral BID PRN Ursula Alert, MD   5 mg at 04/21/17 2056   Or  . OLANZapine (ZYPREXA) injection 5 mg  5 mg Intramuscular BID PRN Eappen, Saramma, MD      . ondansetron (ZOFRAN) tablet 4 mg  4 mg Oral Q8H PRN Rankin, Shuvon B, NP      . pantoprazole (PROTONIX) EC tablet 40 mg  40 mg Oral Daily Rankin, Shuvon B, NP   40 mg at 04/22/17 0742  . zolpidem (AMBIEN) tablet 5 mg  5  mg Oral QHS PRN Cobos, Myer Peer, MD   5 mg at 04/21/17 2056    PTA Medications: Prescriptions Prior to Admission  Medication Sig Dispense Refill Last Dose  . benztropine (COGENTIN) 0.5 MG tablet Take 0.5 mg by mouth 2 (two) times daily.   unknown  . diltiazem (DILACOR XR) 120 MG 24 hr capsule Take 120 mg by mouth daily.   unknown  . divalproex (DEPAKOTE) 250 MG DR tablet Take 750 mg by mouth at bedtime.    unknown  . haloperidol (HALDOL) 10 MG tablet Take 10 mg by mouth 3 (three) times daily.   unknown  . haloperidol decanoate (HALDOL DECANOATE) 100 MG/ML injection Inject 150 mg into the muscle every 28 (twenty-eight) days.    unknown  . ibuprofen (ADVIL,MOTRIN) 200 MG tablet Take 200-400 mg by mouth every 4 (four) hours as needed for moderate pain.    unknown  . LORazepam (ATIVAN) 1 MG tablet Take 1 mg by mouth every 6 (six) hours as needed for anxiety.   unknown  . pantoprazole (PROTONIX) 20 MG tablet Take 20 mg by mouth daily.   unknown  . QUEtiapine (SEROQUEL) 50 MG tablet Take 50 mg by mouth at bedtime.   unknown  . traZODone (  DESYREL) 100 MG tablet Take 100 mg by mouth at bedtime as needed for sleep.   unknown  . zolpidem (AMBIEN) 10 MG tablet Take 10 mg by mouth at bedtime.   unknown    Treatment Modalities: Medication Management, Group therapy, Case management,  1 to 1 session with clinician, Psychoeducation, Recreational therapy.  Patient Stressors: Medication change or noncompliance  Patient Strengths: Ability for insight Communication skills   Physician Treatment Plan for Primary Diagnosis: Bipolar affective disorder, current episode, mania with psychotic features. Long Term Goal(s): Improvement in symptoms so as ready for discharge  Short Term Goals: Ability to identify changes in lifestyle to reduce recurrence of condition will improve Ability to verbalize feelings will improve Ability to identify changes in lifestyle to reduce recurrence of condition will  improve Ability to verbalize feelings will improve Ability to demonstrate self-control will improve  Medication Management: Evaluate patient's response, side effects, and tolerance of medication regimen.  Therapeutic Interventions: 1 to 1 sessions, Unit Group sessions and Medication administration.  Evaluation of Outcomes: Progressing   10/12:  Pt is progressing slowly.  Able to respond appropriately to question before veering off into circumstantial thought.  Continue Depakote ER 750 mg po qhs for mood sx. -Depakote level therapeutic on admission. -Continue Haldol to 10 mg po bid for psychosis. -Haldol decanoate IM 150 mg - received 04/05/2017 - repeat q30 days. -Discontinued unit restriction on 04/08/2017 -Discontinue Ativan - unknown if this is causing cognitive issues. -Will continue Ambien 5 mg po qhs for insomnia.  10/17:  Pt has been visited by both ACT team [last Friday} and guardian {today]  Both are stating she is too delusional and easily upset to be able to return to [facility] at this juncture.  [place] coming to see her on Friday to give [place] feedback.  There have been no changes in medication since last tx plan review  10/22: Patient slowly improving with less disorganization. Her depakote level has increased to 74 over the weekend. She has ongoing delusions and agrees to trial of abilify as we lower dose of oral haldol.  - Continue Depakote ER 1000 mg qhs - Decrease haldol to haldol 5 mg BID. - Start Abilify 15mg  qDay - Continue Ambien 5 mg for insomnia.  Physician Treatment Plan for Secondary Diagnosis: Principal Problem:   Bipolar disorder, current episode manic, severe with psychotic features ([place])  Long Term Goal(s): Improvement in symptoms so as ready for discharge  Short Term Goals: Ability to identify changes in lifestyle to reduce recurrence of condition will improve Ability to verbalize feelings will improve Ability to identify changes in lifestyle to reduce recurrence of  condition will improve Ability to verbalize feelings will improve Ability to demonstrate self-control will improve  Medication Management: Evaluate patient's response, side effects, and tolerance of medication regimen.  Therapeutic Interventions: 1 to 1 sessions, Unit Group sessions and Medication administration.  Evaluation of Outcomes: Progressing   RN Treatment Plan for Primary Diagnosis: Bipolar affective disorder, current episode, mania with psychotic features. Long Term Goal(s): Knowledge of disease and therapeutic regimen to maintain health will improve  Short Term Goals: Ability to remain free from injury will improve, Ability to disclose and discuss suicidal ideas, Ability to identify and develop effective coping behaviors will improve and Compliance with prescribed medications will improve  Medication Management: RN will administer medications as ordered by provider, will assess and evaluate patient's response and provide education to patient for prescribed medication. RN will report any adverse and/or side effects to  prescribing provider.  Therapeutic Interventions: 1 on 1 counseling sessions, Psychoeducation, Medication administration, Evaluate responses to treatment, Monitor vital signs and CBGs as ordered, Perform/monitor CIWA, COWS, AIMS and Fall Risk screenings as ordered, Perform wound care treatments as ordered.  Evaluation of Outcomes: Progressing   LCSW Treatment Plan for Primary Diagnosis: Bipolar affective disorder, current episode, mania with psychotic features. Long Term Goal(s): Safe transition to appropriate next level of care at discharge, Engage patient in therapeutic group addressing interpersonal concerns.  Short Term Goals: Engage patient in aftercare planning with referrals and resources, Facilitate acceptance of mental health diagnosis and concerns, Identify triggers associated with mental health/substance abuse issues and Increase skills for wellness and  recovery  Therapeutic Interventions: Assess for all discharge needs, 1 to 1 time with Social worker, Explore available resources and support systems, Assess for adequacy in community support network, Educate family and significant other(s) on suicide prevention, Complete Psychosocial Assessment, Interpersonal group therapy.  Evaluation of Outcomes: Progressing   Progress in Treatment: Attending groups: Yes Participating in groups: Yes Taking medication as prescribed: Yes Toleration of medication: Yes, no side effects reported at this time Family/Significant other contact made: Ludger Nutting [phone], guardian  Patient understands diagnosis: No, limited insight  Discussing patient identified problems/goals with staff: Yes Medical problems stabilized or resolved: Yes Denies suicidal/homicidal ideation: Yes Issues/concerns per patient self-inventory: None Other: N/A  New problem(s) identified: None identified at this time.   New Short Term/Long Term Goal(s):  Guardian, [name] Ball, wants [name] Ball to receive help with "Medication stabilization".   Discharge Plan or Barriers: Pt will return to [facility] , follow up [facility] ACT team  Reason for Continuation of Hospitalization: Medication stabilization    Estimated Length of Stay: 10/26  Attendees: Patient:  04/22/2017  10:42 AM  Physician: Maris Berger, MD 04/22/2017  10:42 AM  Nursing: Jonette Mate, RN 04/22/2017  10:42 AM  RN Care Manager: Lars Pinks, RN 04/22/2017  10:42 AM  Social Worker: Ripley Fraise, LCSW; Verdis Frederickson, Social Work Intern 04/22/2017  10:42 AM  Recreational Therapist: Victorino Sparrow, LRT 04/22/2017  10:42 AM  Other: Norberto Sorenson, Carlos 04/22/2017  10:42 AM  Other:  04/22/2017  10:42 AM  Other: 04/22/2017  10:42 AM    Scribe for Treatment Team: Trish Mage, LCSW 04/22/2017 10:42 AM

## 2017-04-22 NOTE — Progress Notes (Signed)
Patient ID: [name] Ball, female   DOB: February 21, 1966, 51 y.o.   MRN: [id number] [facility] MD Progress Note  04/22/2017 3:03 PM [name] Ball  MRN:  [id number] Subjective:  "They want me to work at the bank - at [employer], but I think I still want to be a sheriff."  Objective; patient seen chart reviewed.  Patient presents today with ongoing symptoms of psychosis. She is disorganized but generally pleasant and cooperative. She has been attending groups. She is paranoid and circumstantial. She has delusions that she is very wealthy and owns multiple houses. She believes that she is at a school in [place] currently and her children are on the other side of the school. She denies AH and VH, but she indicates that she sees her children playing out the window. She reports that she is sleeping well and her appetite is adequate. She denies any tremors, restlessness, or complaints related to her muscles. She denies SI/HI. She states "I've been on haldol for years and it ain't doing nothing." Discussed with patient about addition of abilify to her current regimen and she was in agreement to a trial. She had no further questions, comments, or concerns.   Principal Problem: Bipolar disorder, current episode manic, severe with psychotic features ([place]) Diagnosis:   Patient Active Problem List   Diagnosis Date Noted  . Bipolar disorder, current episode manic, severe with psychotic features ([place]) [F31.2] 04/03/2017  . Hepatitis C virus infection without hepatic coma [B19.20]   . Bipolar disorder ([place]) [F31.9]   . GERD (gastroesophageal reflux disease) [K21.9]    Total Time spent with patient: 30 minutes  Past Psychiatric History: Reviewed.  Past Medical History:  Past Medical History:  Diagnosis Date  . Bipolar disorder ([place])   . GERD (gastroesophageal reflux disease)   . Hepatitis C virus infection without hepatic coma     Past Surgical History:  Procedure Laterality Date  . abdominal cyst removed    .  CESAREAN SECTION    . COLON SURGERY     Family History: History reviewed. No pertinent family history. Family Psychiatric  History: Reviewed. Social History:  History  Alcohol Use No     History  Drug Use No    Social History   Social History  . Marital status: Unknown    Spouse name: N/A  . Number of children: N/A  . Years of education: N/A   Social History Main Topics  . Smoking status: Current Every Day Smoker  . Smokeless tobacco: Never Used  . Alcohol use No  . Drug use: No  . Sexual activity: Not Asked   Other Topics Concern  . None   Social History Narrative  . None   Additional Social History:    Pain Medications: See MAR Prescriptions: See MAR Over the Counter: See MAR History of alcohol / drug use?: No history of alcohol / drug abuse Longest period of sobriety (when/how long): NA                   Sleep: Good  Appetite:  Fair  Current Medications: Current Facility-Administered Medications  Medication Dose Route Frequency Provider Last Rate Last Dose  . ARIPiprazole (ABILIFY) tablet 15 mg  15 mg Oral Daily Pennelope Bracken, MD      . benztropine (COGENTIN) tablet 0.5 mg  0.5 mg Oral BID Rankin, Shuvon B, NP   0.5 mg at 04/22/17 0742  . diltiazem (DILACOR XR) 24 hr capsule 120 mg  120 mg Oral Daily  Rankin, Shuvon B, NP   120 mg at 04/22/17 0743  . divalproex (DEPAKOTE) DR tablet 1,000 mg  1,000 mg Oral QHS Pennelope Bracken, MD   1,000 mg at 04/21/17 2056  . haloperidol (HALDOL) tablet 5 mg  5 mg Oral BID Pennelope Bracken, MD      . Derrill Memo ON 05/06/2017] haloperidol decanoate (HALDOL DECANOATE) 100 MG/ML injection 150 mg  150 mg Intramuscular Q30 days Eappen, Ria Clock, MD      . ibuprofen (ADVIL,MOTRIN) tablet 200-400 mg  200-400 mg Oral Q4H PRN Rankin, Shuvon B, NP   400 mg at 04/21/17 2056  . loperamide (IMODIUM) capsule 2 mg  2 mg Oral PRN Lindon Romp A, NP      . magnesium hydroxide (MILK OF MAGNESIA) suspension 30 mL   30 mL Oral Daily PRN Rankin, Shuvon B, NP      . nicotine (NICODERM CQ - dosed in mg/24 hr) patch 7 mg  7 mg Transdermal Daily Patriciaann Clan E, PA-C   7 mg at 04/19/17 0747  . OLANZapine zydis (ZYPREXA) disintegrating tablet 5 mg  5 mg Oral BID PRN Ursula Alert, MD   5 mg at 04/21/17 2056   Or  . OLANZapine (ZYPREXA) injection 5 mg  5 mg Intramuscular BID PRN Eappen, Saramma, MD      . ondansetron (ZOFRAN) tablet 4 mg  4 mg Oral Q8H PRN Rankin, Shuvon B, NP      . pantoprazole (PROTONIX) EC tablet 40 mg  40 mg Oral Daily Rankin, Shuvon B, NP   40 mg at 04/22/17 1601  . zolpidem (AMBIEN) tablet 5 mg  5 mg Oral QHS PRN Cobos, Myer Peer, MD   5 mg at 04/21/17 2056    Lab Results:  Results for orders placed or performed during the hospital encounter of 04/03/17 (from the past 48 hour(s))  Valproic acid level     Status: None   Collection Time: 04/21/17  6:53 AM  Result Value Ref Range   Valproic Acid Lvl 74 50.0 - 100.0 ug/mL    Comment: Performed at [facility], [place] [address] [place], [place] [address]    Blood Alcohol level:  Lab Results  Component Value Date   [facility] <5 03/31/2017   ETH <5 55/73/2202    Metabolic Disorder Labs: Lab Results  Component Value Date   HGBA1C 4.6 (L) 04/05/2017   MPG 85.32 04/05/2017   Lab Results  Component Value Date   PROLACTIN 50.6 (H) 04/05/2017   Lab Results  Component Value Date   CHOL 130 04/05/2017   TRIG 51 04/05/2017   HDL 57 04/05/2017   CHOLHDL 2.3 04/05/2017   VLDL 10 04/05/2017   LDLCALC 63 04/05/2017   LDLCALC 89 05/03/2012    Physical Findings: AIMS: Facial and Oral Movements Muscles of Facial Expression: None, normal Lips and Perioral Area: None, normal Jaw: None, normal Tongue: None, normal,Extremity Movements Upper (arms, wrists, hands, fingers): None, normal Lower (legs, knees, ankles, toes): None, normal, Trunk Movements Neck, shoulders, hips: None, normal, Overall Severity Severity of  abnormal movements (highest score from questions above): None, normal Incapacitation due to abnormal movements: None, normal Patient's awareness of abnormal movements (rate only patient's report): No Awareness, Dental Status Current problems with teeth and/or dentures?: No Does patient usually wear dentures?: No  CIWA:  CIWA-Ar Total: 1 COWS:  COWS Total Score: 2  Musculoskeletal: Strength & Muscle Tone: within normal limits Gait & Station: normal Patient leans: N/A  Psychiatric  Specialty Exam: Physical Exam  Review of Systems  Constitutional: Negative for chills and fever.  HENT: Negative.   Respiratory: Negative for cough.   Cardiovascular: Negative for chest pain.  Gastrointestinal: Negative for nausea and vomiting.  Neurological: Negative.  Negative for tremors.    Blood pressure 110/77, pulse (!) 102, temperature 98.7 F (37.1 C), temperature source Oral, resp. rate 16, height 5\' 6"  (1.676 m), weight 81.6 kg (179 lb 14.3 oz), SpO2 98 %.Body mass index is 29.04 kg/m.  General Appearance: Casual  Eye Contact:  Fair  Speech:  Clear and Coherent  Volume:  Normal  Mood:  Anxious  Affect:  Blunt and Flat  Thought Process:  Goal Directed and Descriptions of Associations: Circumstantial  Orientation:  Full (Time, Place, and Person)  Thought Content:  Hallucinations: Visual and Paranoid Ideation  Suicidal Thoughts:  No  Homicidal Thoughts:  No  Memory:  Immediate;   Fair Recent;   Fair Remote;   Fair  Judgement:  Fair  Insight:  Fair  Psychomotor Activity:  Normal  Concentration:  Concentration: Fair and Attention Span: Fair  Recall:  [employer] of Knowledge:  Good  Language:  Good  Akathisia:  No  Handed:  Right  AIMS (if indicated):     Assets:  Communication Skills Desire for Improvement Housing  ADL's:  Intact  Cognition:  WNL  Sleep:  Number of Hours: 6     Treatment Plan Summary: Daily contact with patient to assess and evaluate symptoms and progress in  treatment  Patient slowly improving with less disorganization. Her depakote level has increased to 74 over the weekend. She has ongoing delusions and agrees to trial of abilify as we lower dose of oral haldol.  - Continue Depakote ER 1000 mg qhs - Decrease haldol to haldol 5 mg BID. - Start Abilify 15mg  qDay - Continue Ambien 5 mg for insomnia. -  Continue discharge planning. -  Encouraged to participate in group milieu therapy.  Pennelope Bracken, MD 04/22/2017, 3:03 PM

## 2017-04-22 NOTE — Progress Notes (Signed)
Adult Psychoeducational Group Note  Date:  04/22/2017 Time:  8:56 PM  Group Topic/Focus:  Wrap-Up Group:   The focus of this group is to help patients review their daily goal of treatment and discuss progress on daily workbooks.  Participation Level:  Active  Participation Quality:  Appropriate  Affect:  Appropriate  Cognitive:  Appropriate  Insight: Appropriate  Engagement in Group:  Engaged  Modes of Intervention:  Discussion  Additional Comments: The patient expressed that she attended groups.The patient also said that she rates today a 7.  Nash Shearer 04/22/2017, 8:56 PM

## 2017-04-23 NOTE — Progress Notes (Signed)
Patient ID: [name] Ball, female   DOB: [birth year], 51 y.o.   MRN: [id number] [facility] MD Progress Note  04/23/2017 5:17 PM [name] Ball  MRN:  [id number] Subjective:  "I'm ready to go home."  Objective; patient seen and chart reviewed.  Patient presents today with continued symptoms of psychosis with disorganization of speech and thought. She is generally pleasant and cooperative. She is paranoid, delusional, and she has poor insight. Some of her delusional statements appear to have improved, as she does not believe she is in [place] now and acknowledges she is in [place]. She also no longer believes that [facility] is a school, and she acknowledges it is a hospital. She denies AH and VH, but she indicates that she has been responding to internal stimuli and has seen "their little crop tops running around outside" referencing her own children which she indicates she has been watching from her own window. She reports that she is sleeping well and her appetite is adequate. She denies any physical complaints today. She denies SI/HI. She is tolerating addition of abilify to her current treatment regimen without difficulty. She is oriented to date, month, year, and location. She has 1/3 on 5 minute recall She had no further questions, comments, or concerns.   Principal Problem: Bipolar disorder, current episode manic, severe with psychotic features ([place]) Diagnosis:   Patient Active Problem List   Diagnosis Date Noted  . Bipolar disorder, current episode manic, severe with psychotic features ([place]) [F31.2] 04/03/2017  . Hepatitis C virus infection without hepatic coma [B19.20]   . Bipolar disorder ([place]) [F31.9]   . GERD (gastroesophageal reflux disease) [K21.9]    Total Time spent with patient: 30 minutes  Past Psychiatric History: Reviewed.  Past Medical History:  Past Medical History:  Diagnosis Date  . Bipolar disorder ([place])   . GERD (gastroesophageal reflux disease)   . Hepatitis C virus  infection without hepatic coma     Past Surgical History:  Procedure Laterality Date  . abdominal cyst removed    . CESAREAN SECTION    . COLON SURGERY     Family History: History reviewed. No pertinent family history. Family Psychiatric  History: Reviewed. Social History:  History  Alcohol Use No     History  Drug Use No    Social History   Social History  . Marital status: Unknown    Spouse name: N/A  . Number of children: N/A  . Years of education: N/A   Social History Main Topics  . Smoking status: Current Every Day Smoker  . Smokeless tobacco: Never Used  . Alcohol use No  . Drug use: No  . Sexual activity: Not Asked   Other Topics Concern  . None   Social History Narrative  . None   Additional Social History:    Pain Medications: See MAR Prescriptions: See MAR Over the Counter: See MAR History of alcohol / drug use?: No history of alcohol / drug abuse Longest period of sobriety (when/how long): NA                   Sleep: Good  Appetite:  Fair  Current Medications: Current Facility-Administered Medications  Medication Dose Route Frequency Provider Last Rate Last Dose  . ARIPiprazole (ABILIFY) tablet 15 mg  15 mg Oral Daily Pennelope Bracken, MD   15 mg at 04/23/17 4193  . benztropine (COGENTIN) tablet 0.5 mg  0.5 mg Oral BID Rankin, Shuvon B, NP   0.5 mg at 04/23/17 1640  .  diltiazem (DILACOR XR) 24 hr capsule 120 mg  120 mg Oral Daily Rankin, Shuvon B, NP   120 mg at 04/23/17 0832  . divalproex (DEPAKOTE) DR tablet 1,000 mg  1,000 mg Oral QHS Maris Berger T, MD   1,000 mg at 04/22/17 2154  . haloperidol (HALDOL) tablet 5 mg  5 mg Oral BID Pennelope Bracken, MD   5 mg at 04/23/17 1641  . [START ON 05/06/2017] haloperidol decanoate (HALDOL DECANOATE) 100 MG/ML injection 150 mg  150 mg Intramuscular Q30 days Eappen, Ria Clock, MD      . ibuprofen (ADVIL,MOTRIN) tablet 200-400 mg  200-400 mg Oral Q4H PRN Rankin, Shuvon B, NP    400 mg at 04/21/17 2056  . loperamide (IMODIUM) capsule 2 mg  2 mg Oral PRN Lindon Romp A, NP      . magnesium hydroxide (MILK OF MAGNESIA) suspension 30 mL  30 mL Oral Daily PRN Rankin, Shuvon B, NP      . nicotine (NICODERM CQ - dosed in mg/24 hr) patch 7 mg  7 mg Transdermal Daily Patriciaann Clan E, PA-C   7 mg at 04/23/17 0834  . OLANZapine zydis (ZYPREXA) disintegrating tablet 5 mg  5 mg Oral BID PRN Ursula Alert, MD   5 mg at 04/21/17 2056   Or  . OLANZapine (ZYPREXA) injection 5 mg  5 mg Intramuscular BID PRN Eappen, Saramma, MD      . ondansetron (ZOFRAN) tablet 4 mg  4 mg Oral Q8H PRN Rankin, Shuvon B, NP      . pantoprazole (PROTONIX) EC tablet 40 mg  40 mg Oral Daily Rankin, Shuvon B, NP   40 mg at 04/23/17 0833  . zolpidem (AMBIEN) tablet 5 mg  5 mg Oral QHS PRN Cobos, Myer Peer, MD   5 mg at 04/22/17 2154    Lab Results:  No results found for this or any previous visit (from the past 48 hour(s)).  Blood Alcohol level:  Lab Results  Component Value Date   ETH <5 03/31/2017   ETH <5 41/66/0630    Metabolic Disorder Labs: Lab Results  Component Value Date   HGBA1C 4.6 (L) 04/05/2017   MPG 85.32 04/05/2017   Lab Results  Component Value Date   PROLACTIN 50.6 (H) 04/05/2017   Lab Results  Component Value Date   CHOL 130 04/05/2017   TRIG 51 04/05/2017   HDL 57 04/05/2017   CHOLHDL 2.3 04/05/2017   VLDL 10 04/05/2017   LDLCALC 63 04/05/2017   LDLCALC 89 05/03/2012    Physical Findings: AIMS: Facial and Oral Movements Muscles of Facial Expression: None, normal Lips and Perioral Area: None, normal Jaw: None, normal Tongue: None, normal,Extremity Movements Upper (arms, wrists, hands, fingers): None, normal Lower (legs, knees, ankles, toes): None, normal, Trunk Movements Neck, shoulders, hips: None, normal, Overall Severity Severity of abnormal movements (highest score from questions above): None, normal Incapacitation due to abnormal movements: None,  normal Patient's awareness of abnormal movements (rate only patient's report): No Awareness, Dental Status Current problems with teeth and/or dentures?: No Does patient usually wear dentures?: No  CIWA:  CIWA-Ar Total: 1 COWS:  COWS Total Score: 2  Musculoskeletal: Strength & Muscle Tone: within normal limits Gait & Station: normal Patient leans: N/A  Psychiatric Specialty Exam: Physical Exam  Nursing note and vitals reviewed.   Review of Systems  Constitutional: Negative for chills and fever.  Respiratory: Negative for cough and shortness of breath.   Cardiovascular: Negative for chest pain.  Gastrointestinal: Negative for abdominal pain, heartburn, nausea and vomiting.  Neurological: Negative for tremors.    Blood pressure 126/86, pulse 90, temperature 98.7 F (37.1 C), temperature source Oral, resp. rate 18, height 5\' 6"  (1.676 m), weight 81.6 kg (179 lb 14.3 oz), SpO2 98 %.Body mass index is 29.04 kg/m.  General Appearance: Casual  Eye Contact:  Fair  Speech:  Clear and Coherent  Volume:  Normal  Mood:  Anxious and Depressed  Affect:  Constricted and Flat  Thought Process:  Goal Directed and Descriptions of Associations: Circumstantial  Orientation:  Full (Time, Place, and Person)  Thought Content:  Hallucinations: Visual and Paranoid Ideation  Suicidal Thoughts:  No  Homicidal Thoughts:  No  Memory:  Immediate;   Fair Recent;   Fair Remote;   Fair  Judgement:  Fair  Insight:  Fair  Psychomotor Activity:  Normal  Concentration:  Concentration: Fair and Attention Span: Fair  Recall:  [employer] of Knowledge:  Fair  Language:  Fair  Akathisia:  No  Handed:    AIMS (if indicated):     Assets:  Communication Skills Desire for Improvement Housing  ADL's:  Intact  Cognition:  WNL  Sleep:  Number of Hours: 4     Treatment Plan Summary: Daily contact with patient to assess and evaluate symptoms and progress in treatment  Patient demonstrates ongoing psychotic  symptoms, though she has improved since initial presentation. She has tolerated addition of abilify to her medications without difficulty/side effect.  - Continue Depakote ER 1000 mg qhs - Continue haldol to haldol 5 mg BID. - Continue Abilify 15mg  qDay - Continue Ambien 5 mg for insomnia. -  Continue discharge planning. -  Encouraged to participate in group milieu therapy.  Pennelope Bracken, MD 04/23/2017, 5:17 PM

## 2017-04-23 NOTE — [facility] Group Notes (Signed)
LCSW Group Therapy Note  04/23/2017 1:15pm  Type of Therapy/Topic:  Group Therapy:  Feelings about Diagnosis  Participation Level:  Did Not Attend   Description of Group:   This group will allow patients to explore their thoughts and feelings about diagnoses they have received. Patients will be guided to explore their level of understanding and acceptance of these diagnoses. Facilitator will encourage patients to process their thoughts and feelings about the reactions of others to their diagnosis and will guide patients in identifying ways to discuss their diagnosis with significant others in their lives. This group will be process-oriented, with patients participating in exploration of their own experiences, giving and receiving support, and processing challenge from other group members.   Therapeutic Goals: 1. Patient will demonstrate understanding of diagnosis as evidenced by identifying two or more symptoms of the disorder 2. Patient will be able to express two feelings regarding the diagnosis 3. Patient will demonstrate their ability to communicate their needs through discussion and/or role play  Summary of Patient Progress:       Therapeutic Modalities:   Cognitive Behavioral Therapy Brief Therapy Feelings Identification    Trish Mage, LCSW 04/23/2017 3:11 PM

## 2017-04-23 NOTE — Plan of Care (Signed)
Problem: Medication: Goal: Compliance with prescribed medication regimen will improve Outcome: Progressing Pt is taking medications as prescribed.

## 2017-04-23 NOTE — Progress Notes (Signed)
Recreation Therapy Notes  Date: 04/23/17 Time: 1000 Location: 500 Hall Dayroom  Group Topic: Decision Making, Communication  Goal Area(s) Addresses:  Patient will identify factors that guided their decision making.  Patient will identify benefit of healthy decision making post d/c.   Behavioral Response: Minimal  Intervention:  Choices in a Jar Game  Activity: Choices in a Jar.  LRT read a card from the jar.  Each card gave the patients two options.  Patients would have to tell with scenario they chose and why.   Education: Education officer, community, Environmental health practitioner, Discharge Planning   Education Outcome: Acknowledges education  Clinical Observations/Feedback: Pt was bright and answered a few questions.  Pt then went as sat by the window and just looked out over the courtyard.    Victorino Sparrow, LRT/CTRS         Victorino Sparrow A 04/23/2017 12:13 PM

## 2017-04-23 NOTE — Progress Notes (Signed)
D:Pt has minimal interaction and is difficult to understand due to not having any teeth. Pt takes her medications without complaints and is tolerating well. Pt talks about wanting to go with her son and wanting to go to an assisted living facility. A:Offered support, encouragement and 15 minute checks. R:Pt denies si and hi. Safety maintained on the unit.

## 2017-04-23 NOTE — Progress Notes (Signed)
Did not attend group 
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
## 2017-04-24 MED ORDER — ARIPIPRAZOLE 10 MG PO TABS
20.0000 mg | ORAL_TABLET | Freq: Every day | ORAL | Status: DC
  Administered 2017-04-25 – 2017-04-26 (×2): 20 mg via ORAL
  Filled 2017-04-24 (×4): qty 2

## 2017-04-24 NOTE — Progress Notes (Signed)
Patient ID: [name] Ball, female   DOB: 09/09/1965, 51 y.o.   MRN: [id number]  D: Patient in room on approach. Pt states "I tired of smiling". Pt reports she feels lonely because her family is not supportive or come by to visit her. Pt denies SI/HI/AVH and pain. Cooperative with assessment.    A: Medications administered as prescribed. Support and encouragement provided. Pt encouraged to discuss feelings and come to staff with any question or concerns.   R: Patient remains safe and complaint with medications.

## 2017-04-24 NOTE — Progress Notes (Signed)
Patient did not attend wrap up group. 

## 2017-04-24 NOTE — [facility] Group Notes (Signed)
LCSW Group Therapy Note  04/24/2017 1:15pm  Type of Therapy/Topic:  Group Therapy:  Balance in Life  Participation Level:  Minimal  Description of Group:    This group will address the concept of balance and how it feels and looks when one is unbalanced. Patients will be encouraged to process areas in their lives that are out of balance and identify reasons for remaining unbalanced. Facilitators will guide patients in utilizing problem-solving interventions to address and correct the stressor making their life unbalanced. Understanding and applying boundaries will be explored and addressed for obtaining and maintaining a balanced life. Patients will be encouraged to explore ways to assertively make their unbalanced needs known to significant others in their lives, using other group members and facilitator for support and feedback.  Therapeutic Goals: 1. Patient will identify two or more emotions or situations they have that consume much of in their lives. 2. Patient will identify signs/triggers that life has become out of balance:  3. Patient will identify two ways to set boundaries in order to achieve balance in their lives:  4. Patient will demonstrate ability to communicate their needs through discussion and/or role plays  Summary of Patient Progress:  Brienne was in group for a few minutes at the beginning of the group session.  She shared ideas for different types of emotions but did not stay to share about her own emotions.   She appeared distracted and confused.  Her thoughts were disorganized and she had difficulty staying focused.     Therapeutic Modalities:   Cognitive Behavioral Therapy Solution-Focused Therapy Assertiveness Training  Darleen Crocker, [place] Work 04/24/2017 11:44 AM

## 2017-04-24 NOTE — Plan of Care (Signed)
Problem: Activity: Goal: Interest or engagement in activities will improve Outcome: Progressing Pt off unit for recreational activities with peers (basketball) "I had fun" returned to unit without issues.

## 2017-04-24 NOTE — Progress Notes (Signed)
Patient ID: [name] Ball, female   DOB: 07-Jun-1966, 51 y.o.   MRN: [id number]  Pt currently presents with a flat affect and labile behavior. Main complaint is nausea and stomach cramping which she attributes to a change a medication. Pt asks for writer to tell nurse and doctor about her concern. At first, patient refuses medications and then takes them. Pt reports good sleep with current medication regimen.   Pt provided with medications per providers orders. Pt's labs and vitals were monitored throughout the night. Pt given a 1:1 about emotional and mental status. Pt supported and encouraged to express concerns and questions. Pt educated on medications.   Pt's safety ensured with 15 minute and environmental checks. Pt currently denies SI/HI and A/V hallucinations. Pt verbally agrees to seek staff if SI/HI or A/VH occurs and to consult with staff before acting on any harmful thoughts. Will continue POC.

## 2017-04-24 NOTE — Progress Notes (Signed)
Recreation Therapy Notes  Date: 04/24/17 Time: 1000 Location: 500 Hall Dayroom  Group Topic: Stress Management  Goal Area(s) Addresses:  Patient will verbalize importance of using healthy stress management.  Patient will identify positive emotions associated with healthy stress management.   Intervention: Architect paper, markers  Activity :  Personalized Plates.  Patients were to create a personalized plate where they were to highlight the positive things about themselves.  Patients could highlight things such as biggest accomplishment, important dates, favorite foods, etc.  Education:  Stress Management, Discharge Planning.   Education Outcome: Acknowledges edcuation/In group clarification offered/Needs additional education  Clinical Observations/Feedback:  Pt did not attend group.   Victorino Sparrow, LRT/CTRS         Victorino Sparrow A 04/24/2017 12:32 PM

## 2017-04-24 NOTE — Progress Notes (Signed)
Patient ID: [name] Ball, female   DOB: 10/11/1965, 51 y.o.   MRN: [id number] [facility] MD Progress Note  04/24/2017 11:10 AM [name] Ball  MRN:  [id number] Subjective:  "I'm in the [place], you know. I'm just coming from [facility]."  Objective; patient seen and chart reviewed.  Patient demonstrates improvement of irritability and withdrawal symptoms. She is alert and organized in regards to speech. However, she continues to display delusions and paranoia with poor insight. 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 She is focused on discharge and suggests that she could stay at an assisted living location in the area or near [place]. She does not want to return to her previous group home setting. We discussed that we would be in contact with her guardian regarding discharge planning, and pt verbalized good understanding.  She is oriented x3 but recall is still 1/3. She has been compliant with her medications and we discussed increasing dose of abilify, and pt was in agreement. She had no further questions, comments, or concerns.  Principal Problem: Bipolar disorder, current episode manic, severe with psychotic features ([place]) Diagnosis:   Patient Active Problem List   Diagnosis Date Noted  . Bipolar disorder, current episode manic, severe with psychotic features ([place]) [F31.2] 04/03/2017  . Hepatitis C virus infection without hepatic coma [B19.20]   . Bipolar disorder ([place]) [F31.9]   . GERD (gastroesophageal reflux disease) [K21.9]    Total Time spent with patient: 30 minutes  Past Psychiatric History: Reviewed.  Past Medical History:  Past Medical History:  Diagnosis Date  . Bipolar disorder ([place])   . GERD (gastroesophageal reflux disease)   . Hepatitis C virus infection without hepatic coma     Past Surgical History:  Procedure Laterality Date  . abdominal cyst  removed    . CESAREAN SECTION    . COLON SURGERY     Family History: History reviewed. No pertinent family history. Family Psychiatric  History: Reviewed. Social History:  History  Alcohol Use No     History  Drug Use No    Social History   Social History  . Marital status: Unknown    Spouse name: N/A  . Number of children: N/A  . Years of education: N/A   Social History Main Topics  . Smoking status: Current Every Day Smoker  . Smokeless tobacco: Never Used  . Alcohol use No  . Drug use: No  . Sexual activity: Not Asked   Other Topics Concern  . None   Social History Narrative  . None   Additional Social History:    Pain Medications: See MAR Prescriptions: See MAR Over the Counter: See MAR History of alcohol / drug use?: No history of alcohol / drug abuse Longest period of sobriety (when/how long): NA                   Sleep: Good  Appetite:  Fair  Current Medications: Current Facility-Administered Medications  Medication Dose Route Frequency Provider Last Rate Last Dose  . [START ON 04/25/2017] ARIPiprazole (ABILIFY) tablet 20 mg  20 mg Oral Daily Maris Berger T, MD      . benztropine (COGENTIN) tablet 0.5 mg  0.5 mg Oral BID Rankin, Shuvon B, NP   0.5 mg at 04/24/17 0817  . diltiazem (DILACOR XR) 24 hr capsule 120 mg  120 mg Oral Daily Rankin, Shuvon B, NP  120 mg at 04/24/17 0817  . divalproex (DEPAKOTE) DR tablet 1,000 mg  1,000 mg Oral QHS Maris Berger T, MD   1,000 mg at 04/23/17 2129  . haloperidol (HALDOL) tablet 5 mg  5 mg Oral BID Pennelope Bracken, MD   5 mg at 04/24/17 0817  . [START ON 05/06/2017] haloperidol decanoate (HALDOL DECANOATE) 100 MG/ML injection 150 mg  150 mg Intramuscular Q30 days Eappen, Ria Clock, MD      . ibuprofen (ADVIL,MOTRIN) tablet 200-400 mg  200-400 mg Oral Q4H PRN Rankin, Shuvon B, NP   400 mg at 04/21/17 2056  . loperamide (IMODIUM) capsule 2 mg  2 mg Oral PRN Lindon Romp A, NP      .  magnesium hydroxide (MILK OF MAGNESIA) suspension 30 mL  30 mL Oral Daily PRN Rankin, Shuvon B, NP      . nicotine (NICODERM CQ - dosed in mg/24 hr) patch 7 mg  7 mg Transdermal Daily Patriciaann Clan E, PA-C   7 mg at 04/23/17 0834  . OLANZapine zydis (ZYPREXA) disintegrating tablet 5 mg  5 mg Oral BID PRN Ursula Alert, MD   5 mg at 04/21/17 2056   Or  . OLANZapine (ZYPREXA) injection 5 mg  5 mg Intramuscular BID PRN Eappen, Saramma, MD      . ondansetron (ZOFRAN) tablet 4 mg  4 mg Oral Q8H PRN Rankin, Shuvon B, NP      . pantoprazole (PROTONIX) EC tablet 40 mg  40 mg Oral Daily Rankin, Shuvon B, NP   40 mg at 04/24/17 0817  . zolpidem (AMBIEN) tablet 5 mg  5 mg Oral QHS PRN Cobos, Myer Peer, MD   5 mg at 04/23/17 2129    Lab Results:  No results found for this or any previous visit (from the past 48 hour(s)).  Blood Alcohol level:  Lab Results  Component Value Date   ETH <5 03/31/2017   ETH <5 51/88/4166    Metabolic Disorder Labs: Lab Results  Component Value Date   HGBA1C 4.6 (L) 04/05/2017   MPG 85.32 04/05/2017   Lab Results  Component Value Date   PROLACTIN 50.6 (H) 04/05/2017   Lab Results  Component Value Date   CHOL 130 04/05/2017   TRIG 51 04/05/2017   HDL 57 04/05/2017   CHOLHDL 2.3 04/05/2017   VLDL 10 04/05/2017   LDLCALC 63 04/05/2017   LDLCALC 89 05/03/2012    Physical Findings: AIMS: Facial and Oral Movements Muscles of Facial Expression: None, normal Lips and Perioral Area: None, normal Jaw: None, normal Tongue: None, normal,Extremity Movements Upper (arms, wrists, hands, fingers): None, normal Lower (legs, knees, ankles, toes): None, normal, Trunk Movements Neck, shoulders, hips: None, normal, Overall Severity Severity of abnormal movements (highest score from questions above): None, normal Incapacitation due to abnormal movements: None, normal Patient's awareness of abnormal movements (rate only patient's report): No Awareness, Dental  Status Current problems with teeth and/or dentures?: No Does patient usually wear dentures?: No  CIWA:  CIWA-Ar Total: 1 COWS:  COWS Total Score: 2  Musculoskeletal: Strength & Muscle Tone: within normal limits Gait & Station: normal Patient leans: N/A  Psychiatric Specialty Exam: Physical Exam  Nursing note and vitals reviewed.   Review of Systems  Constitutional: Negative for fever.  Respiratory: Negative for cough and shortness of breath.   Cardiovascular: Negative for chest pain.  Gastrointestinal: Negative for nausea and vomiting.  Skin: Negative for rash.    Blood pressure 98/66, pulse (!) 113, temperature  98.5 F (36.9 C), temperature source Oral, resp. rate 16, height 5\' 6"  (1.676 m), weight 81.6 kg (179 lb 14.3 oz), SpO2 98 %.Body mass index is 29.04 kg/m.  General Appearance: Casual  Eye Contact:  Fair  Speech:  Clear and Coherent  Volume:  Normal  Mood:  Euthymic  Affect:  Constricted and Flat  Thought Process:  Goal Directed and Descriptions of Associations: Circumstantial  Orientation:  Full (Time, Place, and Person)  Thought Content:  Delusions, Ideas of Reference:   Delusions and Paranoid Ideation  Suicidal Thoughts:  No  Homicidal Thoughts:  No  Memory:  Immediate;   Fair Recent;   Fair Remote;   Fair  Judgement:  Impaired  Insight:  Fair and Lacking  Psychomotor Activity:  Normal  Concentration:  Concentration: Fair and Attention Span: Fair  Recall:  Poor  Fund of Knowledge:  Fair  Language:  Fair  Akathisia:  No  Handed:    AIMS (if indicated):     Assets:  Communication Skills Desire for Improvement Housing  ADL's:  Intact  Cognition:  WNL  Sleep:  Number of Hours: 6     Treatment Plan Summary: Daily contact with patient to assess and evaluate symptoms and progress in treatment  Patient demonstrates continued delusions and paranoia, but she is calm and cooperative. She has significant improvement of agitation and disorganization. -  Continue Depakote ER 1000 mg qhs - Continue haldol to haldol 5 mg BID. - Change Abilify 15mg  qDay to Abilify 20mg  qDay - Continue Ambien 5 mg for insomnia. -  Continue discharge planning. -  Encouraged to participate in group milieu therapy.  Pennelope Bracken, MD 04/24/2017, 11:10 AM

## 2017-04-24 NOTE — Progress Notes (Signed)
D: Pt presents animated and forwards in her conversation during interactions. Denies SI, HI, AVH and pain. Pt has been logical in her conversation with sta Pt has been ruminating about relationship with family and placement issues, "I do want to see my children, they don't live far from here, I can [facility] to [facility] in [place] too, my whole family lives down there, I'm trying to find a job and a place to stay, [employer] city assisted living for me". Reports she's eating and sleeping well.  A: Scheduled and PRN medications administered as prescribed with verbal education and effects monitored. . Encouraged pt to to comply with treatment regimen including groups. Routine safety checks maintained without self harm gestures.  R: Pt has been receptive to care. Compliant with medications when offered.  Attended groups. Off unit for recreational time with peers, returned without issues. POC continues for mood stability and safety.

## 2017-04-25 NOTE — Tx Team (Signed)
Interdisciplinary Treatment and Diagnostic Plan Update  04/25/2017 Time of Session: 9:19 AM  Shaleta Ruacho MRN: [id number]  Principal Diagnosis: Bipolar affective disorder, current episode, mania with psychotic features. Secondary Diagnoses: Principal Problem:   Bipolar disorder, current episode manic, severe with psychotic features ([place])   Current Medications:  Current Facility-Administered Medications  Medication Dose Route Frequency Provider Last Rate Last Dose  . ARIPiprazole (ABILIFY) tablet 20 mg  20 mg Oral Daily Pennelope Bracken, MD   20 mg at 04/25/17 0813  . benztropine (COGENTIN) tablet 0.5 mg  0.5 mg Oral BID Rankin, Shuvon B, NP   0.5 mg at 04/25/17 0815  . diltiazem (DILACOR XR) 24 hr capsule 120 mg  120 mg Oral Daily Rankin, Shuvon B, NP   120 mg at 04/25/17 0813  . divalproex (DEPAKOTE) DR tablet 1,000 mg  1,000 mg Oral QHS Maris Berger T, MD   1,000 mg at 04/24/17 2132  . haloperidol (HALDOL) tablet 5 mg  5 mg Oral BID Pennelope Bracken, MD   5 mg at 04/25/17 0813  . [START ON 05/06/2017] haloperidol decanoate (HALDOL DECANOATE) 100 MG/ML injection 150 mg  150 mg Intramuscular Q30 days Eappen, Ria Clock, MD      . ibuprofen (ADVIL,MOTRIN) tablet 200-400 mg  200-400 mg Oral Q4H PRN Rankin, Shuvon B, NP   400 mg at 04/24/17 1957  . loperamide (IMODIUM) capsule 2 mg  2 mg Oral PRN Lindon Romp A, NP      . magnesium hydroxide (MILK OF MAGNESIA) suspension 30 mL  30 mL Oral Daily PRN Rankin, Shuvon B, NP      . nicotine (NICODERM CQ - dosed in mg/24 hr) patch 7 mg  7 mg Transdermal Daily Patriciaann Clan E, PA-C   7 mg at 04/25/17 0815  . OLANZapine zydis (ZYPREXA) disintegrating tablet 5 mg  5 mg Oral BID PRN Ursula Alert, MD   5 mg at 04/21/17 2056   Or  . OLANZapine (ZYPREXA) injection 5 mg  5 mg Intramuscular BID PRN Eappen, Ria Clock, MD      . ondansetron (ZOFRAN) tablet 4 mg  4 mg Oral Q8H PRN Rankin, Shuvon B, NP   4 mg at 04/24/17 1957  .  pantoprazole (PROTONIX) EC tablet 40 mg  40 mg Oral Daily Rankin, Shuvon B, NP   40 mg at 04/25/17 0815  . zolpidem (AMBIEN) tablet 5 mg  5 mg Oral QHS PRN Cobos, Myer Peer, MD   5 mg at 04/24/17 2133    PTA Medications: Prescriptions Prior to Admission  Medication Sig Dispense Refill Last Dose  . benztropine (COGENTIN) 0.5 MG tablet Take 0.5 mg by mouth 2 (two) times daily.   unknown  . diltiazem (DILACOR XR) 120 MG 24 hr capsule Take 120 mg by mouth daily.   unknown  . divalproex (DEPAKOTE) 250 MG DR tablet Take 750 mg by mouth at bedtime.    unknown  . haloperidol (HALDOL) 10 MG tablet Take 10 mg by mouth 3 (three) times daily.   unknown  . haloperidol decanoate (HALDOL DECANOATE) 100 MG/ML injection Inject 150 mg into the muscle every 28 (twenty-eight) days.    unknown  . ibuprofen (ADVIL,MOTRIN) 200 MG tablet Take 200-400 mg by mouth every 4 (four) hours as needed for moderate pain.    unknown  . LORazepam (ATIVAN) 1 MG tablet Take 1 mg by mouth every 6 (six) hours as needed for anxiety.   unknown  . pantoprazole (PROTONIX) 20 MG tablet Take 20  mg by mouth daily.   unknown  . QUEtiapine (SEROQUEL) 50 MG tablet Take 50 mg by mouth at bedtime.   unknown  . traZODone (DESYREL) 100 MG tablet Take 100 mg by mouth at bedtime as needed for sleep.   unknown  . zolpidem (AMBIEN) 10 MG tablet Take 10 mg by mouth at bedtime.   unknown    Treatment Modalities: Medication Management, Group therapy, Case management,  1 to 1 session with clinician, Psychoeducation, Recreational therapy.  Patient Stressors: Medication change or noncompliance  Patient Strengths: Ability for insight Communication skills   Physician Treatment Plan for Primary Diagnosis: Bipolar affective disorder, current episode, mania with psychotic features. Long Term Goal(s): Improvement in symptoms so as ready for discharge  Short Term Goals: Ability to identify changes in lifestyle to reduce recurrence of condition will  improve Ability to verbalize feelings will improve Ability to identify changes in lifestyle to reduce recurrence of condition will improve Ability to verbalize feelings will improve Ability to demonstrate self-control will improve  Medication Management: Evaluate patient's response, side effects, and tolerance of medication regimen.  Therapeutic Interventions: 1 to 1 sessions, Unit Group sessions and Medication administration.  Evaluation of Outcomes: Adequate for Discharge   10/12:  Pt is progressing slowly.  Able to respond appropriately to question before veering off into circumstantial thought.  Continue Depakote ER 750 mg po qhs for mood sx. -Depakote level therapeutic on admission. -Continue Haldol to 10 mg po bid for psychosis. -Haldol decanoate IM 150 mg - received 04/05/2017 - repeat q30 days. -Discontinued unit restriction on 04/08/2017 -Discontinue Ativan - unknown if this is causing cognitive issues. -Will continue Ambien 5 mg po qhs for insomnia.  10/17:  Pt has been visited by both ACT team [last Friday} and guardian {today]  Both are stating she is too delusional and easily upset to be able to return to [facility] at this juncture.  [place] coming to see her on Friday to give [place] feedback.  There have been no changes in medication since last tx plan review  10/22: Patient slowly improving with less disorganization. Her depakote level has increased to 74 over the weekend. She has ongoing delusions and agrees to trial of abilify as we lower dose of oral haldol.  - Continue Depakote ER 1000 mg qhs - Decrease haldol to haldol 5 mg BID. - Start Abilify 15mg  qDay - Continue Ambien 5 mg for insomnia.  Physician Treatment Plan for Secondary Diagnosis: Principal Problem:   Bipolar disorder, current episode manic, severe with psychotic features ([place])  Long Term Goal(s): Improvement in symptoms so as ready for discharge  Short Term Goals: Ability to identify changes in lifestyle to reduce  recurrence of condition will improve Ability to verbalize feelings will improve Ability to identify changes in lifestyle to reduce recurrence of condition will improve Ability to verbalize feelings will improve Ability to demonstrate self-control will improve  Medication Management: Evaluate patient's response, side effects, and tolerance of medication regimen.  Therapeutic Interventions: 1 to 1 sessions, Unit Group sessions and Medication administration.  Evaluation of Outcomes: Adequate for Discharge   RN Treatment Plan for Primary Diagnosis: Bipolar affective disorder, current episode, mania with psychotic features. Long Term Goal(s): Knowledge of disease and therapeutic regimen to maintain health will improve  Short Term Goals: Ability to remain free from injury will improve, Ability to disclose and discuss suicidal ideas, Ability to identify and develop effective coping behaviors will improve and Compliance with prescribed medications will improve  Medication Management:  RN will administer medications as ordered by provider, will assess and evaluate patient's response and provide education to patient for prescribed medication. RN will report any adverse and/or side effects to prescribing provider.  Therapeutic Interventions: 1 on 1 counseling sessions, Psychoeducation, Medication administration, Evaluate responses to treatment, Monitor vital signs and CBGs as ordered, Perform/monitor CIWA, COWS, AIMS and Fall Risk screenings as ordered, Perform wound care treatments as ordered.  Evaluation of Outcomes: Adequate for Discharge   LCSW Treatment Plan for Primary Diagnosis: Bipolar affective disorder, current episode, mania with psychotic features. Long Term Goal(s): Safe transition to appropriate next level of care at discharge, Engage patient in therapeutic group addressing interpersonal concerns.  Short Term Goals: Engage patient in aftercare planning with referrals and resources,  Facilitate acceptance of mental health diagnosis and concerns, Identify triggers associated with mental health/substance abuse issues and Increase skills for wellness and recovery  Therapeutic Interventions: Assess for all discharge needs, 1 to 1 time with Social worker, Explore available resources and support systems, Assess for adequacy in community support network, Educate family and significant other(s) on suicide prevention, Complete Psychosocial Assessment, Interpersonal group therapy.  Evaluation of Outcomes: Progressing   Progress in Treatment: Attending groups: Yes Participating in groups: Yes Taking medication as prescribed: Yes Toleration of medication: Yes, no side effects reported at this time Family/Significant other contact made: Ludger Nutting [phone], guardian  Patient understands diagnosis: No, limited insight  Discussing patient identified problems/goals with staff: Yes Medical problems stabilized or resolved: Yes Denies suicidal/homicidal ideation: Yes Issues/concerns per patient self-inventory: None Other: N/A  New problem(s) identified: None identified at this time.   New Short Term/Long Term Goal(s):  Guardian, Symphanie Uvaldo Rising, wants Janyra to receive help with "Medication stabilization".   Discharge Plan or Barriers: Pt will return to [place] , follow up [facility] ACT team  Reason for Continuation of Hospitalization:     Estimated Length of Stay: Likely d/c tomorrow  Attendees: Patient:  04/25/2017  9:19 AM  Physician: Maris Berger, MD 04/25/2017  9:19 AM  Nursing: Jonette Mate, RN 04/25/2017  9:19 AM  RN Care Manager: Lars Pinks, RN 04/25/2017  9:19 AM  Social Worker: Ripley Fraise, LCSW; Verdis Frederickson, Social Work Intern 04/25/2017  9:19 AM  Recreational Therapist: Victorino Sparrow, [place] 04/25/2017  9:19 AM  Other: Norberto Sorenson, [place] 04/25/2017  9:19 AM  Other:  04/25/2017  9:19 AM   Other: 04/25/2017  9:19 AM    Scribe for Treatment Team: Trish Mage, LCSW 04/25/2017 9:19 AM

## 2017-04-25 NOTE — Progress Notes (Signed)
Patient attended group and said that her day was a 8.  Patient said she had some visitors that tried pressure her to make some major decisions, but she refused.

## 2017-04-25 NOTE — Plan of Care (Signed)
Problem: Safety: Goal: Periods of time without injury will increase Outcome: Progressing Patient is safe and free from injury.  Routine safety checks maintained every 15 minutes.   

## 2017-04-25 NOTE — Progress Notes (Signed)
  DATA ACTION RESPONSE  Objective- Pt. is visible in the room, seen eating a snack. Presents with a depressed affect and mood. Thought process remains tangential. C/o of insomnia this evening.No abnormal s/s.  Subjective- Denies having any SI/HI/AVH/Pain at this time. Is cooperative and remaina safe on the unit.  1:1 interaction in private to establish rapport. Encouragement, education, & support given from staff.  PRN Ambien requested and will re-eval accordingly.   Safety maintained with Q 15 checks. Continue with POC.

## 2017-04-25 NOTE — [facility] Group Notes (Signed)
LCSW Group Therapy 04/25/2017 1:15pm  Type of Therapy and Topic:  Group Therapy:  Change and Accountability  Participation Level:  Did Not Attend  Description of Group In this group, patients discussed power and accountability for change.  The group identified the challenges related to accountability and the difficulty of accepting the outcomes of negative behaviors.  Patients were encouraged to openly discuss a challenge/change they could take responsibility for.  Patients discussed the use of "change talk" and positive thinking as ways to support achievement of personal goals.  The group discussed ways to give support and empowerment to peers.  Therapeutic Goals: 1. Patients will state the relationship between personal power and accountability in the change process 2. Patients will identify the positive and negative consequences of a personal choice they have made 3. Patients will identify one challenge/choice they will take responsibility for making 4. Patients will discuss the role of "change talk" and the impact of positive thinking as it supports successful personal change 5. Patients will verbalize support and affirmation of change efforts in peers  Summary of Patient Progress:    Therapeutic Modalities Solution Focused Brief Therapy Motivational Interviewing Cognitive Behavioral Therapy  [name] Ball Work 04/25/2017 1:14 PM

## 2017-04-25 NOTE — Progress Notes (Signed)
DAR NOTE: Patient presents with anxious affect and mood.  Denies pain, auditory and visual hallucinations.  Described energy level as normal and concentration as good.  Rates depression at 0, hopelessness at 0, and anxiety at 0.  Maintained on routine safety checks.  Medications given as prescribed.  Support and encouragement offered as needed.  Attended group and participated.  States goal for today is "discharge."  Patient observed socializing with peers in the dayroom.  Offered no complaint.

## 2017-04-25 NOTE — Progress Notes (Signed)
Recreation Therapy Notes  Date: 04/25/17 Time: 1000 Location: 500 Hall Dayroom  Group Topic: Life Goals, Goal Setting  Goal Area(s) Addresses:  Patient will be able to identify at least 3 life goals.  Patient will be able to identify benefit of investing in life goals.  Patient will be able to identify benefit of setting life goals.   Intervention: Goal Planning Worksheet  Activity: Goal Planning.  Patients were given Ball worksheet in which they had to come up with Ball goal for next week, next month, next year and in five years.  Patients were to then identify the obstacles to reaching those goals, what they need to achieve their goals and what they can begin doing to work towards their goals.  Education: Discharge Planning, Coping Skills, Life Goals  Education Outcome: Acknowledges Education/In Group Clarification Provided/Needs Additional Education  Clinical Observations:  Pt did not attend group.   [name] Ball, LRT/CTRS         [name] Ball 04/25/2017 11:49 AM 

## 2017-04-25 NOTE — Progress Notes (Signed)
Patient ID: [name] Ball, female   DOB: 1966/06/06, 51 y.o.   MRN: [id number] [facility] MD Progress Note  04/25/2017 4:18 PM [name] Ball  MRN:  [id number] Subjective:  "Sir, when are you going to let me go? I have to get back to my work."  Objective; patient seen and chart reviewed.  Patient continues to improve in regards to agitation, irritability, and disorganization. She is pleasant and cooperative today. She alert and oriented. However, she remains paranoid and delusional. 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 She would like them to be able to discharge to wherever she discharges to. She denies SI/HI/AH/VH. She is focused on discharge. She feels that she is doing well overall and she has no specific concerns today. She would not like to go back to her group home. Discussed with patient that she could work with her ACT team if she wanted to change her living arrangement and pt verbalized understanding. She agreed to continue her current treatment regimen without changes. She had no further questions, comments, or concerns.   Principal Problem: Bipolar disorder, current episode manic, severe with psychotic features ([place]) Diagnosis:   Patient Active Problem List   Diagnosis Date Noted  . Bipolar disorder, current episode manic, severe with psychotic features ([place]) [F31.2] 04/03/2017  . Hepatitis C virus infection without hepatic coma [B19.20]   . Bipolar disorder ([place]) [F31.9]   . GERD (gastroesophageal reflux disease) [K21.9]    Total Time spent with patient: 30 minutes  Past Psychiatric History: Reviewed.  Past Medical History:  Past Medical History:  Diagnosis Date  . Bipolar disorder ([place])   . GERD (gastroesophageal reflux disease)   . Hepatitis C virus infection  without hepatic coma     Past Surgical History:  Procedure Laterality Date  . abdominal cyst removed    . CESAREAN SECTION    . COLON SURGERY     Family History: History reviewed. No pertinent family history. Family Psychiatric  History: Reviewed. Social History:  History  Alcohol Use No     History  Drug Use No    Social History   Social History  . Marital status: Unknown    Spouse name: N/A  . Number of children: N/A  . Years of education: N/A   Social History Main Topics  . Smoking status: Current Every Day Smoker  . Smokeless tobacco: Never Used  . Alcohol use No  . Drug use: No  . Sexual activity: Not Asked   Other Topics Concern  . None   Social History Narrative  . None   Additional Social History:    Pain Medications: See MAR Prescriptions: See MAR Over the Counter: See MAR History of alcohol / drug use?: No history of alcohol / drug abuse Longest period of sobriety (when/how long): NA                   Sleep: Good  Appetite:  Fair  Current Medications: Current Facility-Administered Medications  Medication Dose Route Frequency Provider Last Rate Last Dose  . ARIPiprazole (ABILIFY) tablet 20 mg  20 mg Oral Daily Pennelope Bracken, MD   20 mg at 04/25/17 0813  . benztropine (COGENTIN) tablet 0.5 mg  0.5 mg Oral BID Rankin, Shuvon B, NP  0.5 mg at 04/25/17 0815  . diltiazem (DILACOR XR) 24 hr capsule 120 mg  120 mg Oral Daily Rankin, Shuvon B, NP   120 mg at 04/25/17 0813  . divalproex (DEPAKOTE) DR tablet 1,000 mg  1,000 mg Oral QHS Maris Berger T, MD   1,000 mg at 04/24/17 2132  . haloperidol (HALDOL) tablet 5 mg  5 mg Oral BID Pennelope Bracken, MD   5 mg at 04/25/17 0813  . [START ON 05/06/2017] haloperidol decanoate (HALDOL DECANOATE) 100 MG/ML injection 150 mg  150 mg Intramuscular Q30 days Eappen, Ria Clock, MD      . ibuprofen (ADVIL,MOTRIN) tablet 200-400 mg  200-400 mg Oral Q4H PRN Rankin, Shuvon B, NP   400 mg at  04/24/17 1957  . loperamide (IMODIUM) capsule 2 mg  2 mg Oral PRN Lindon Romp A, NP      . magnesium hydroxide (MILK OF MAGNESIA) suspension 30 mL  30 mL Oral Daily PRN Rankin, Shuvon B, NP      . nicotine (NICODERM CQ - dosed in mg/24 hr) patch 7 mg  7 mg Transdermal Daily Patriciaann Clan E, PA-C   7 mg at 04/25/17 0815  . OLANZapine zydis (ZYPREXA) disintegrating tablet 5 mg  5 mg Oral BID PRN Ursula Alert, MD   5 mg at 04/21/17 2056   Or  . OLANZapine (ZYPREXA) injection 5 mg  5 mg Intramuscular BID PRN Eappen, Ria Clock, MD      . ondansetron (ZOFRAN) tablet 4 mg  4 mg Oral Q8H PRN Rankin, Shuvon B, NP   4 mg at 04/24/17 1957  . pantoprazole (PROTONIX) EC tablet 40 mg  40 mg Oral Daily Rankin, Shuvon B, NP   40 mg at 04/25/17 0815  . zolpidem (AMBIEN) tablet 5 mg  5 mg Oral QHS PRN Cobos, Myer Peer, MD   5 mg at 04/24/17 2133    Lab Results:  No results found for this or any previous visit (from the past 48 hour(s)).  Blood Alcohol level:  Lab Results  Component Value Date   ETH <5 03/31/2017   ETH <5 23/30/0762    Metabolic Disorder Labs: Lab Results  Component Value Date   HGBA1C 4.6 (L) 04/05/2017   MPG 85.32 04/05/2017   Lab Results  Component Value Date   PROLACTIN 50.6 (H) 04/05/2017   Lab Results  Component Value Date   CHOL 130 04/05/2017   TRIG 51 04/05/2017   HDL 57 04/05/2017   CHOLHDL 2.3 04/05/2017   VLDL 10 04/05/2017   LDLCALC 63 04/05/2017   LDLCALC 89 05/03/2012    Physical Findings: AIMS: Facial and Oral Movements Muscles of Facial Expression: None, normal Lips and Perioral Area: None, normal Jaw: None, normal Tongue: None, normal,Extremity Movements Upper (arms, wrists, hands, fingers): None, normal Lower (legs, knees, ankles, toes): None, normal, Trunk Movements Neck, shoulders, hips: None, normal, Overall Severity Severity of abnormal movements (highest score from questions above): None, normal Incapacitation due to abnormal movements:  None, normal Patient's awareness of abnormal movements (rate only patient's report): No Awareness, Dental Status Current problems with teeth and/or dentures?: No Does patient usually wear dentures?: No  CIWA:  CIWA-Ar Total: 1 COWS:  COWS Total Score: 2  Musculoskeletal: Strength & Muscle Tone: within normal limits Gait & Station: normal Patient leans: N/A  Psychiatric Specialty Exam: Physical Exam  Nursing note and vitals reviewed.   Review of Systems  Constitutional: Negative for fever.  Respiratory: Negative for cough and shortness of breath.  Cardiovascular: Negative for chest pain.  Gastrointestinal: Negative for nausea and vomiting.  Skin: Negative for rash.    Blood pressure (!) 99/58, pulse 77, temperature 98.5 F (36.9 C), temperature source Oral, resp. rate 16, height 5\' 6"  (1.676 m), weight 81.6 kg (179 lb 14.3 oz), SpO2 98 %.Body mass index is 29.04 kg/m.  General Appearance: Casual  Eye Contact:  Fair  Speech:  Clear and Coherent  Volume:  Normal  Mood:  Anxious and Irritable  Affect:  Constricted and Flat  Thought Process:  Goal Directed and Descriptions of Associations: Circumstantial  Orientation:  Full (Time, Place, and Person)  Thought Content:  Delusions, Ideas of Reference:   Delusions and Paranoid Ideation  Suicidal Thoughts:  No  Homicidal Thoughts:  No  Memory:  Immediate;   Fair Recent;   Fair Remote;   Fair  Judgement:  Impaired  Insight:  Lacking  Psychomotor Activity:  Normal  Concentration:  Concentration: Fair and Attention Span: Fair  Recall:  Poor  Fund of Knowledge:  Fair  Language:  Fair  Akathisia:  No  Handed:    AIMS (if indicated):     Assets:  Communication Skills Desire for Improvement Housing  ADL's:  Intact  Cognition:  WNL  Sleep:  Number of Hours: 5.25     Treatment Plan Summary: Daily contact with patient to assess and evaluate symptoms and progress in treatment  Patient has ongoing delusions and paranoia, but  she is calm and cooperative, and symptoms of agitation/irritability are minimal. She is tolerating her medications without difficulty and she has been accepted to return to her group home on 04/26/17.  - Continue Depakote ER 1000 mg qhs for mood stabilization - Continue Abilify 20mg  qDay for psychosis - Continue diltiazem 120mg  qDay - Continue Haldol 5mg  BID for psychosis - Continue Haldol decanoate 150mg  q30 Days (next due on 05/06/17) - Continue Ambien 5 mg for insomnia. -  Continue discharge planning, anticipate DC to group home on 04/26/17 -  Encouraged to participate in group milieu therapy.  Pennelope Bracken, MD 04/25/2017, 4:18 PM

## 2017-04-26 MED ORDER — IBUPROFEN 200 MG PO TABS: 200.0000 mg | ORAL_TABLET | ORAL | 0 refills | Status: DC | PRN

## 2017-04-26 MED ORDER — ZOLPIDEM TARTRATE 5 MG PO TABS: 5.0000 mg | ORAL_TABLET | Freq: Every evening | ORAL | 0 refills | Status: DC | PRN

## 2017-04-26 MED ORDER — DILTIAZEM HCL ER 120 MG PO CP24: 120.0000 mg | ORAL_CAPSULE | Freq: Every day | ORAL | 0 refills | Status: DC

## 2017-04-26 MED ORDER — ARIPIPRAZOLE 20 MG PO TABS: 20.0000 mg | ORAL_TABLET | Freq: Every day | ORAL | 0 refills | Status: DC

## 2017-04-26 MED ORDER — NICOTINE 7 MG/24HR TD PT24: 7.0000 mg | MEDICATED_PATCH | Freq: Every day | TRANSDERMAL | 0 refills | Status: DC

## 2017-04-26 MED ORDER — HALOPERIDOL 5 MG PO TABS: 5.0000 mg | ORAL_TABLET | Freq: Two times a day (BID) | ORAL | 0 refills | Status: DC

## 2017-04-26 MED ORDER — BENZTROPINE MESYLATE 0.5 MG PO TABS: 0.5000 mg | ORAL_TABLET | Freq: Two times a day (BID) | ORAL | 0 refills | Status: DC

## 2017-04-26 MED ORDER — DIVALPROEX SODIUM 500 MG PO DR TAB: 1000.0000 mg | DELAYED_RELEASE_TABLET | Freq: Every day | ORAL | 0 refills | Status: DC

## 2017-04-26 MED ORDER — PANTOPRAZOLE SODIUM 20 MG PO TBEC: 20.0000 mg | DELAYED_RELEASE_TABLET | Freq: Every day | ORAL | Status: DC

## 2017-04-26 MED ORDER — HALOPERIDOL DECANOATE 100 MG/ML IM SOLN: 150.0000 mg | INTRAMUSCULAR | 0 refills | Status: DC

## 2017-04-26 NOTE — Progress Notes (Signed)
Patient discharged to lobby. Patient was stable and appreciative at that time. All papers and prescriptions were given and valuables returned. Verbal understanding expressed. Denies SI/HI and A/VH. Patient given opportunity to express concerns and ask questions.  

## 2017-04-26 NOTE — Progress Notes (Signed)
Recreation Therapy Notes  Date: 04/26/17 Time: 1000 Location:  [address]  Group Topic: Communication  Goal Area(s) Addresses:  Patient will effectively communicate with peers in group.  Patient will verbalize benefit of healthy communication. Patient will verbalize positive effect of healthy communication on post d/c goals.  Patient will identify communication techniques that made activity effective for group.   Intervention:  Rubber discs  Activity: Traffic Jam.  Patients were given one rubber disc each, plus one extra for the group.  Patients were to use the discs to travel as a group from one end of the hall to the other and back.  If any person stepped off their disc, the group would have to start from the beginning.    Education:Communication, Discharge Planning  Education Outcome: Acknowledges understanding/In group clarification offered/Needs additional education.   Clinical Observations/Feedback: Pt did not attend group.   Tija Biss, LRT/CTRS         Jermiah Soderman A 04/26/2017 12:28 PM 

## 2017-04-26 NOTE — Discharge Summary (Signed)
Physician Discharge Summary Note  Patient:  [name] Ball is an 51 y.o., female MRN:  [id number] DOB:  [birth year] Patient phone:  [phone] (home)  Patient address:   [address] [address],  Total Time spent with patient: Greater than 30 minutes  Date of Admission:  04/03/2017 Date of Discharge: 04-26-17  Reason for Admission: Worsening psychosis & manic episodes.  Principal Problem: Bipolar disorder, current episode manic, severe with psychotic features [facility])  Discharge Diagnoses: Patient Active Problem List   Diagnosis Date Noted  . Bipolar disorder, current episode manic, severe with psychotic features ([place]) [F31.2] 04/03/2017  . Hepatitis C virus infection without hepatic coma [B19.20]   . Bipolar disorder ([place]) [F31.9]   . GERD (gastroesophageal reflux disease) [K21.9]    Past Psychiatric History: Bipolar disorder, current episode, manic severe with psychotic features  Past Medical History:  Past Medical History:  Diagnosis Date  . Bipolar disorder ([place])   . GERD (gastroesophageal reflux disease)   . Hepatitis C virus infection without hepatic coma     Past Surgical History:  Procedure Laterality Date  . abdominal cyst removed    . CESAREAN SECTION    . COLON SURGERY     Family History: History reviewed. No pertinent family history.  Family Psychiatric  History: See H&P  Social History:  History  Alcohol Use No     History  Drug Use No    Social History   Social History  . Marital status: Unknown    Spouse name: N/A  . Number of children: N/A  . Years of education: N/A   Social History Main Topics  . Smoking status: Current Every Day Smoker  . Smokeless tobacco: Never Used  . Alcohol use No  . Drug use: No  . Sexual activity: Not Asked   Other Topics Concern  . None   Social History Narrative  . None   Hospital Course: (Per admission assessment): This is an admission assessment for this 51 year old AA  female with hx of mental illness. 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 She is admitted to the [facility] with complaints of declining mental state, ataxia, worsening delusions, wandering tendencies, aggressive behavior & worsening delusions. During this assessment; [name] Ball reports, "I just went to put an application. I went to some kind of church. I came from [place]. My sister said you should not do that because I want some kind of family. [place] live [place] [address]. I don't take medicines. I got my apartment & my boyfriend is just there".  After the above admission assessment, it was determined based on her presenting symptoms that [name] Ball will need medication regimen to stabilize her manic symptoms. Chart review indicated a patient with chronic mental illness & has been receiving mental health treatment for a long time. She also resides in a group home setting & has an Act Team that over seas her mental health care & treatment.   During the early course of her hospitalization, [name] Ball presented daily with pressured speech, tangential/circumstantial stories, delusional thinking, hallucinations & fixation on military issues. It took quite some time with medication management, redirections & support from staff to get her symptoms under control. She was evaluated daily for her response to her treatment regimen. She was medicated & discharged on; Abilify 20 mg for mood control, Cogentin 0.5 mg for EPS, Depakote DR 1,000 mg for mood stabilization, Haldol Decanoate 150 mg Q 30 days for mood control, Nicotine patch 7  mg for nicotine withdrawal symptoms & Ambien 5 mg for insomnia. She also received other medication regimen for the other medical issues that she presented. [name] Ball was enrolled & seldom participated in the group counseling sessions being offered and held on this unit. She learned some coping skills that should help her cope better and maintain mood stability after discharge.   As it was listed on her  discharge medication regimen, [name] Ball is being discharged to her place of resident as her mood has stabilized. However, she is going home on 2 separate antipsychotic medications (Abilify tablets & Haldol tablets & Haldol Decanoate monthly injectable). This is because her symptoms did not respond well under an antipsychotic monotherapy.The failed antipsychotic monotherapies include; Haldol, Risperdal & Abilify.  However, the combination therapy of haldol & abilify seem to have helped improve her symptoms. As a result, it is necessary for [name] to continue on these combination therapies as recommended to maintain maximum symptom control after discharge. However, in the event that her symptoms improve, she can then be titrated down to an antipsychotic monotherapy to avoid risks of metabolic syndrome and other related adverse effects associated with use multiple antipsychotic medications.This has to be done within the proper evaluation & judgement of her outpatient provider.   After discharge & going forward, [name] Ball will follow-up care for routine psychiatric care & medication management as noted below on an outpatient basis. She is provided with all the necessary information required to make these appointments without problems. Upon discharge, she adamantly denies any SIHI, AVH, delusional thoughts and or paranoia.  She left [facility] with all personal belongings in no distress. Transportation per her Cytogeneticist.   Physical Findings: AIMS: Facial and Oral Movements Muscles of Facial Expression: None, normal Lips and Perioral Area: None, normal Jaw: None, normal Tongue: None, normal,Extremity Movements Upper (arms, wrists, hands, fingers): None, normal Lower (legs, knees, ankles, toes): None, normal, Trunk Movements Neck, shoulders, hips: None, normal, Overall Severity Severity of abnormal movements (highest score from questions above): None, normal Incapacitation due to abnormal movements: None,  normal Patient's awareness of abnormal movements (rate only patient's report): No Awareness, Dental Status Current problems with teeth and/or dentures?: No Does patient usually wear dentures?: No  CIWA:  CIWA-Ar Total: 1 COWS:  COWS Total Score: 2  Musculoskeletal: Strength & Muscle Tone: within normal limits Gait & Station: normal Patient leans: N/A  Psychiatric Specialty Exam: Physical Exam  Constitutional: She appears well-developed.  HENT:  Head: Normocephalic.  Eyes: Pupils are equal, round, and reactive to light.  Neck: Normal range of motion.  Cardiovascular:  Elevated pulse rate  Respiratory: Effort normal.  GI: Soft.  Genitourinary:  Genitourinary Comments: Deferred  Musculoskeletal: Normal range of motion.  Skin: Skin is warm.    Review of Systems  Constitutional: Negative.   HENT: Negative.   Eyes: Negative.   Respiratory: Negative.   Cardiovascular: Negative.   Gastrointestinal: Negative.   Genitourinary: Negative.   Musculoskeletal: Negative.   Skin: Negative.   Neurological: Negative.   Endo/Heme/Allergies: Negative.   Psychiatric/Behavioral: Positive for depression (Stabilized with medication prior to discharge), hallucinations (Hx. Psychosis, delusional thoughts) and substance abuse (Hx. Benzodiazepine use ). Negative for memory loss and suicidal ideas. The patient has insomnia (Stabilized with medication prior to discharge). The patient is not nervous/anxious.     Blood pressure 102/78, pulse (!) 107, temperature 98.6 F (37 C), resp. rate 16, height 5\' 6"  (1.676 m), weight 81.6 kg (179 lb 14.3 oz), SpO2 98 %.Body mass  index is 29.04 kg/m.  See Md's SRA   Have you used any form of tobacco in the last 30 days? (Cigarettes, Smokeless Tobacco, Cigars, and/or Pipes): Yes (per chart review)  Has this patient used any form of tobacco in the last 30 days? (Cigarettes, Smokeless Tobacco, Cigars, and/or Pipes):Yes, recommended nicotine prescription 7 mg for  smoking cessation.  Blood Alcohol level:  Lab Results  Component Value Date   [facility] <5 03/31/2017   ETH <5 02/72/5366   Metabolic Disorder Labs:  Lab Results  Component Value Date   HGBA1C 4.6 (L) 04/05/2017   MPG 85.32 04/05/2017   Lab Results  Component Value Date   PROLACTIN 50.6 (H) 04/05/2017   Lab Results  Component Value Date   CHOL 130 04/05/2017   TRIG 51 04/05/2017   HDL 57 04/05/2017   CHOLHDL 2.3 04/05/2017   VLDL 10 04/05/2017   LDLCALC 63 04/05/2017   LDLCALC 89 05/03/2012   See Psychiatric Specialty Exam and Suicide Risk Assessment completed by Attending Physician prior to discharge.  Discharge destination:  Other:  Group Home: [facility]  Is patient on multiple antipsychotic therapies at discharge:  Yes,   Do you recommend tapering to monotherapy for antipsychotics?  Yes   Has Patient had three or more failed trials of antipsychotic monotherapy by history:  Yes,   Antipsychotic medications that previously failed include:   1.  Haldol., 2.  Risperdal. and 3.  Abilify.  Recommended Plan for Multiple Antipsychotic Therapies: And because Olean's symptoms has not been able to achieve symptoms control under an antipsychotic monotherapy, she is currently receiving and being discharged on 2 separate antipsychotic medications Haldol decoanate & Abilify which seem effective in controlling his symptoms at this time. It will benefit patient to continue on these combination antipsychotic therapies as recommended. However, as his symptoms continue to improve, patient may be titrated down to an antipsychotic monotherapy. This is to decrease the chances for development of metabolic syndrome associated with use of multiple antipsychotic therapies. This has to be done within the discretion and proper judgement of her outpatient psychiatric provider.  Discharge Instructions    Discharge instructions    Complete by:  As directed    FYI: The outpatient psychiatric  provider: And because Teka's symptoms has not been able to achieve symptoms control under an antipsychotic monotherapy, she is currently receiving and being discharged on 2 separate antipsychotic medications Haldol decoanate & Abilify which seem effective in controlling her symptoms at this time. It will benefit patient to continue on these combination antipsychotic therapies as recommended. However, as her symptoms continue to improve, patient may be titrated down to an antipsychotic monotherapy. This is to decrease the chances for development of metabolic syndrome associated with use of multiple antipsychotic therapies. This has to be done within the discretion and proper judgement of her outpatient psychiatric provider.     Allergies as of 04/26/2017      Reactions   Trazodone And Nefazodone    Pt stated doesn't make her feel good      Medication List    STOP taking these medications   LORazepam 1 MG tablet Commonly known as:  ATIVAN   QUEtiapine 50 MG tablet Commonly known as:  SEROQUEL   traZODone 100 MG tablet Commonly known as:  DESYREL     TAKE these medications     Indication  ARIPiprazole 20 MG tablet Commonly known as:  ABILIFY Take 1 tablet (20 mg total) by mouth daily. For  mood control  Indication:  Mood control   benztropine 0.5 MG tablet Commonly known as:  COGENTIN Take 1 tablet (0.5 mg total) by mouth 2 (two) times daily. For prevention of drug induced tremors What changed:  additional instructions  Indication:  Extrapyramidal Reaction caused by Medications   diltiazem 120 MG 24 hr capsule Commonly known as:  DILACOR XR Take 1 capsule (120 mg total) by mouth daily. For high blood pressure What changed:  additional instructions  Indication:  High Blood Pressure Disorder   divalproex 500 MG DR tablet Commonly known as:  DEPAKOTE Take 2 tablets (1,000 mg total) by mouth at bedtime. For mood stabilization What changed:  medication strength  how much to  take  additional instructions  Indication:  Mood stabilization   haloperidol 5 MG tablet Commonly known as:  HALDOL Take 1 tablet (5 mg total) by mouth 2 (two) times daily. For mood control What changed:  medication strength  how much to take  when to take this  additional instructions  Indication:  Mood control   haloperidol decanoate 100 MG/ML injection Commonly known as:  HALDOL DECANOATE Inject 1.5 mLs (150 mg total) into the muscle every 30 (thirty) days. (Due on 05-06-17): For mood control What changed:  when to take this  additional instructions  Indication:  Mood control   ibuprofen 200 MG tablet Commonly known as:  ADVIL,MOTRIN Take 1-2 tablets (200-400 mg total) by mouth every 4 (four) hours as needed for moderate pain.  Indication:  Mild to Moderate Pain   nicotine 7 mg/24hr patch Commonly known as:  NICODERM CQ - dosed in mg/24 hr Place 1 patch (7 mg total) onto the skin daily. (May purchase from over the counter): For smoking cessation  Indication:  Nicotine Addiction   pantoprazole 20 MG tablet Commonly known as:  PROTONIX Take 1 tablet (20 mg total) by mouth daily. For acid reflux What changed:  additional instructions  Indication:  Gastroesophageal Reflux Disease   zolpidem 5 MG tablet Commonly known as:  AMBIEN Take 1 tablet (5 mg total) by mouth at bedtime as needed for sleep. What changed:  medication strength  how much to take  when to take this  reasons to take this  Indication:  [place] Follow up.   Why:  Current w ACT Team services, can resume at discharge.  Team lead is [place]. Contact information: [place] [address] [phone]          Follow-up recommendations: Activity:  As tolerated Diet: As recommended by your primary care doctor. Keep all scheduled follow-up appointments as recommended.    Comments: Patient is  instructed prior to discharge to: Take all medications as prescribed by his/her mental healthcare provider. Report any adverse effects and or reactions from the medicines to his/her outpatient provider promptly. Patient has been instructed & cautioned: To not engage in alcohol and or illegal drug use while on prescription medicines. In the event of worsening symptoms, patient is instructed to call the crisis hotline, 911 and or go to the nearest ED for appropriate evaluation and treatment of symptoms. To follow-up with his/her primary care provider for your other medical issues, concerns and or health care needs.   Signed: Encarnacion Slates, NP, PMHNP, FNP-BC 04/26/2017, 8:57 AM   Patient seen, Suicide Assessment Completed.  Disposition Plan Reviewed   - Letoya Stallone is a 51 y/o F with  history of bipolar disorder with psychotic features who presented with worsening agitation, psychosis, and delusions. Pt has a group home and an ACT team. At time of intake her depakote level was subtherapeutic and her dose was increased. She was also started on abilify and titrated to dose of 20mg  qDay to address symptoms of psychosis and agitation. Pt had improvement of her sleep pattern, agitation, and irritability. She was pleasant and cooperative at time of discharge. She continued to endorse some delusions and AH/VH of thinking that her children were at the hospital with her, but she did not seem bothered by these experiences and she was expecting them to come with her back to her group home. Pt is in agreement to follow up with her ACT team. She was able to engage in safety planning including returning to to [facility] or contacting emergency services if she feels unable to maintain her own safety. She denied SI/HI. She agreed to continue her current treatment regimen without changes. She had no further questions, comments, or concerns.   Plan Of Care/Follow-up recommendations:  -Bipolar disorder with psychotic  features             - Continue Abilify 20mg  qDay             - Continue Depakote 1000mg  qhs             - Continue Haldol decanoate 150mg  IM q30 days (next due 05/06/17)             - Continue haldol 5mg  po BID - Extra pyramidal symptoms             - Continue Cogentin 0.5mg  po BID - Insomnia             - Continue ambien 5mg  qhs prn insomnia Activity:  as tolerated Diet:  normal Tests:  N/A Other:  see above for DC plan  Pennelope Bracken, MD

## 2017-04-26 NOTE — Progress Notes (Signed)
  [facility] Adult Case Management Discharge Plan :  Will you be returning to the same living situation after discharge:  Yes,  [facility] At discharge, do you have transportation home?: Yes,  staff Do you have the ability to pay for your medications: Yes,  MCD  Release of information consent forms completed and in the chart;  Patient's signature needed at discharge.  Patient to Follow up at: Follow-up Information    [facility] Follow up.   Why:  If [name] Ball does not see you on Saturday, someone will see you on Monday Contact information: [address] [name] Ball [place] [address] [phone]           Next level of care provider has access to [place] and Suicide Prevention discussed: Yes,  yes  Have you used any form of tobacco in the last 30 days? (Cigarettes, Smokeless Tobacco, Cigars, and/or Pipes): Yes (per chart review)  Has patient been referred to the Quitline?: Patient refused referral  Patient has been referred for addiction treatment: [place], LCSW 04/26/2017, 10:31 AM

## 2017-04-26 NOTE — BHH Suicide Risk Assessment (Signed)
[facility] Discharge Suicide Risk Assessment   Principal Problem: Bipolar disorder, current episode manic, severe with psychotic features [facility]) Discharge Diagnoses:  Patient Active Problem List   Diagnosis Date Noted  . Bipolar disorder, current episode manic, severe with psychotic features ([place]) [F31.2] 04/03/2017  . Hepatitis C virus infection without hepatic coma [B19.20]   . Bipolar disorder ([place]) [F31.9]   . GERD (gastroesophageal reflux disease) [K21.9]     Total Time spent with patient: 30 minutes  Musculoskeletal: Strength & Muscle Tone: within normal limits Gait & Station: normal Patient leans: N/A  Psychiatric Specialty Exam: Review of Systems  Constitutional: Negative for chills and fever.  Respiratory: Negative for cough and shortness of breath.   Cardiovascular: Negative for chest pain.  Gastrointestinal: Negative for heartburn, nausea and vomiting.  Psychiatric/Behavioral: Negative for depression, hallucinations and suicidal ideas. The patient is not nervous/anxious.     Blood pressure 102/78, pulse (!) 107, temperature 98.6 F (37 C), resp. rate 16, height 5\' 6"  (1.676 m), weight 81.6 kg (179 lb 14.3 oz), SpO2 98 %.Body mass index is 29.04 kg/m.  General Appearance: Casual  Eye Contact::  Good  Speech:  Clear and Coherent and Normal Rate  Volume:  Normal  Mood:  Euthymic  Affect:  Appropriate, Congruent and Full Range  Thought Process:  Coherent and Goal Directed  Orientation:  Full (Time, Place, and Person)  Thought Content:  Delusions and Hallucinations: Auditory Visual  Suicidal Thoughts:  No  Homicidal Thoughts:  No  Memory:  Immediate;   Good Recent;   Good Remote;   Good  Judgement:  Good  Insight:  Good  Psychomotor Activity:  Normal  Concentration:  Good  Recall:  Good  Fund of Knowledge:Good  Language: NA  Akathisia:  No  Handed:    AIMS (if indicated):     Assets:  Communication Skills Desire for Improvement Housing Intimacy Social  Support Talents/Skills  Sleep:  Number of Hours: 5.75  Cognition: WNL  ADL's:  Intact   Mental Status Per Nursing Assessment::   On Admission:     Demographic Factors:  Low socioeconomic status  Loss Factors: Financial problems/change in socioeconomic status  Historical Factors: NA  Risk Reduction Factors:   Living with another person, especially a relative, Positive social support, Positive therapeutic relationship and Positive coping skills or problem solving skills  Continued Clinical Symptoms:  Bipolar Disorder:   Mixed State  Cognitive Features That Contribute To Risk:  None    Suicide Risk:  Minimal: No identifiable suicidal ideation.  Patients presenting with no risk factors but with morbid ruminations; may be classified as minimal risk based on the severity of the depressive symptoms  Follow-up Information    [facility] Follow up.   Why:  Current w ACT Team services, can resume at discharge.  Team lead is [place]. Contact information: [place] [address] [phone]           Subjective Data: - Rhegan Trunnell is a 51 y/o F with history of bipolar disorder with psychotic features who presented with worsening agitation, psychosis, and delusions. Pt has a group home and an ACT team. At time of intake her depakote level was subtherapeutic and her dose was increased. She was also started on abilify and titrated to dose of 20mg  qDay to address symptoms of psychosis and agitation. Pt had improvement of her sleep pattern, agitation, and irritability. She was pleasant and cooperative at time of discharge. She continued to endorse  some delusions and AH/VH of thinking that her children were at the hospital with her, but she did not seem bothered by these experiences and she was expecting them to come with her back to her group home. Pt is in agreement to follow up with her ACT team. She was able to engage in safety planning  including returning to to [facility] or contacting emergency services if she feels unable to maintain her own safety. She denied SI/HI. She agreed to continue her current treatment regimen without changes. She had no further questions, comments, or concerns.   Plan Of Care/Follow-up recommendations:  -Bipolar disorder with psychotic features  - Continue Abilify 20mg  qDay  - Continue Depakote 1000mg  qhs  - Continue Haldol decanoate 150mg  IM q30 days (next due 05/06/17)  - Continue haldol 5mg  po BID - Extra pyramidal symptoms  - Continue Cogentin 0.5mg  po BID - Insomnia  - Continue ambien 5mg  qhs prn insomnia Activity:  as tolerated Diet:  normal Tests:  N/A Other:  see above for [place], MD 04/26/2017, 10:13 AM

## 2017-04-26 NOTE — Progress Notes (Signed)
Recreation Therapy Notes  INPATIENT RECREATION TR PLAN  Patient Details Name: Karter Haire MRN: [id number] DOB: February 12, 1966 Today's Date: 04/26/2017  Rec Therapy Plan Is patient appropriate for Therapeutic Recreation?: Yes Treatment times per week: about 3 days Estimated Length of Stay: 5-7 days TR Treatment/Interventions: Group participation (Comment)  Discharge Criteria Pt will be discharged from therapy if:: Discharged Treatment plan/goals/alternatives discussed and agreed upon by:: Patient/family  Discharge Summary Short term goals set: Patient will attend and participate in Recreation Therapy Group Sessions  Short term goals met: Complete Progress toward goals comments: Groups attended Which groups?: Coping skills, Wellness, Leisure education, Other (Comment) (Decision making, Team building) Reason goals not met: None Therapeutic equipment acquired: N/A Reason patient discharged from therapy: Discharge from hospital Pt/family agrees with progress & goals achieved: Yes Date patient discharged from therapy: 04/26/17   Victorino Sparrow, LRT/CTRS  Ria Comment, [place] 04/26/2017, 11:47 AM

## 2017-04-26 NOTE — NC FL2 (Signed)
[place] LEVEL OF CARE SCREENING TOOL     IDENTIFICATION  Patient Name: [name] Ball Birthdate: [birth year] Sex: female Admission Date (Current Location): 04/03/2017  [facility] and [place] Number:  Selena Lesser [id number] [place] and Address:   Larence Penning [facility] [address]  Edison Simon [phone])      Provider Number: [id number]  Attending Physician Name and Address:  Jenne Campus, MD  Relative Name and Phone Number:       Current Level of Care: Hospital Recommended Level of Care: [facility] Prior Approval Number:    Date Approved/Denied:   PASRR Number:    Discharge Plan: Domiciliary (Rest home) ([facility])    Current Diagnoses: Patient Active Problem List   Diagnosis Date Noted  . Bipolar disorder, current episode manic, severe with psychotic features ([place]) 04/03/2017  . Hepatitis C virus infection without hepatic coma   . Bipolar disorder ([place])   . GERD (gastroesophageal reflux disease)     Orientation RESPIRATION BLADDER Height & Weight     Self, Time, Situation, Place  Normal Continent Weight: 179 lb 14.3 oz (81.6 kg) Height:  5\' 6"  (167.6 cm)  BEHAVIORAL SYMPTOMS/MOOD NEUROLOGICAL BOWEL NUTRITION STATUS   (None)  (None) Continent  (Regular Diet)  AMBULATORY STATUS COMMUNICATION OF NEEDS Skin   Independent Verbally Normal                       Personal Care Assistance Level of Assistance  Bathing, Feeding, Dressing Bathing Assistance: Independent Feeding assistance: Independent Dressing Assistance: Independent     Functional Limitations Info   (None)          SPECIAL CARE FACTORS FREQUENCY   (None)                    Contractures Contractures Info: Not present    Additional Factors Info   (None)               Current Medications (04/26/2017):  This is the current hospital active medication list healthcare provider. Bring this list to your next appointment.  Current Medication List-Note: Take only  these medications. Stop taking all medications not included on this list. If you have any questions regarding medications not on this list, please follow up with your healthcare provider. Bring this list to your next appointment.    Morning Afternoon Evening Bedtime As Needed   ARIPiprazole 20 MG tablet  Commonly known as: ABILIFY  Take 1 tablet (20 mg total) by mouth daily. For mood control  For: Mood control  Next does due:               benztropine 0.5 MG tablet  Commonly known as: COGENTIN  Take 1 tablet (0.5 mg total) by mouth 2 (two) times daily. For prevention of drug induced tremors  What changed: additional instructions  For: Extrapyramidal Reaction caused by Medications  Next does due:               diltiazem 120 MG 24 hr capsule  Commonly known as: DILACOR XR  Take 1 capsule (120 mg total) by mouth daily. For high blood pressure  What changed: additional instructions  For: High Blood Pressure Disorder  Next does due:               divalproex 500 MG DR tablet  Commonly known as: DEPAKOTE  Take 2 tablets (1,000 mg total) by mouth at bedtime. For mood stabilization  What changed:  0 medication strength  0 how much to take  0 additional instructions  For: Mood stabilization  Next does due:               haloperidol 5 MG tablet  Commonly known as: HALDOL  Take 1 tablet (5 mg total) by mouth 2 (two) times daily. For mood control  What changed:  0 medication strength  0 how much to take  0 when to take this  0 additional instructions  For: Mood control  Next does due:               haloperidol decanoate 100 MG/ML injection  Commonly known as: HALDOL DECANOATE  Inject 1.5 mLs (150 mg total) into the muscle every 30 (thirty) days. (Due on 05-06-17): For mood control  What changed:  0 when to take this  0 additional instructions  For: Mood control  Next does due:               ibuprofen 200 MG tablet  Commonly known as: ADVIL,MOTRIN  Take  1-2 tablets (200-400 mg total) by mouth every 4 (four) hours as needed for moderate pain.  For: Mild to Moderate Pain  Next does due:               nicotine 7 mg/24hr patch  Commonly known as: NICODERM CQ - dosed in mg/24 hr  Place 1 patch (7 mg total) onto the skin daily. (May purchase from over the counter): For smoking cessation  For: Nicotine Addiction  Next does due:               pantoprazole 20 MG tablet  Commonly known as: PROTONIX  Take 1 tablet (20 mg total) by mouth daily. For acid reflux  What changed: additional instructions  For: Gastroesophageal Reflux Disease  Next does due:               zolpidem 5 MG tablet  Commonly known as: AMBIEN  Take 1 tablet (5 mg total) by mouth at bedtime as needed for sleep.  What changed:  0 medication strength  0 how much to take  0 when to take this  0 reasons to take this  For: [place]  Next does due:            Discharge Medications: Please see discharge summary for a list of discharge medications.  Relevant Imaging Results:  Relevant Lab Results:   Additional [place], LCSW

## 2017-04-26 NOTE — Plan of Care (Signed)
Problem: [facility] Participation in Recreation Therapeutic Interventions Goal: STG-Patient will attend/participate in Rec Therapy Group Ses STG-The Patient will attend and participate in Recreation Therapy Group Sessions  Outcome: Completed/Met Date Met: 04/26/17 Pt attended and participated in decision making, coping skills, leisure education, wellness and team building recreation therapy sessions.   Victorino Sparrow, LRT/CTRS

## 2017-05-23 ENCOUNTER — Other Ambulatory Visit: Payer: Self-pay

## 2017-05-23 ENCOUNTER — Emergency Department (HOSPITAL_COMMUNITY)
Admission: EM | Admit: 2017-05-23 | Discharge: 2017-05-29 | Disposition: A | Discharge status: Other Acute Inpatient Hospital | Payer: Medicaid Other | Attending: Emergency Medicine | Admitting: Emergency Medicine

## 2017-05-23 ENCOUNTER — Emergency Department (HOSPITAL_COMMUNITY): Payer: Medicaid Other

## 2017-05-23 ENCOUNTER — Encounter (HOSPITAL_COMMUNITY): Payer: Self-pay | Admitting: Emergency Medicine

## 2017-05-23 DIAGNOSIS — F209 Schizophrenia, unspecified: Secondary | ICD-10-CM | POA: Diagnosis not present

## 2017-05-23 DIAGNOSIS — R4182 Altered mental status, unspecified: Secondary | ICD-10-CM | POA: Diagnosis present

## 2017-05-23 DIAGNOSIS — F17210 Nicotine dependence, cigarettes, uncomplicated: Secondary | ICD-10-CM | POA: Insufficient documentation

## 2017-05-23 DIAGNOSIS — Z79899 Other long term (current) drug therapy: Secondary | ICD-10-CM | POA: Diagnosis not present

## 2017-05-23 DIAGNOSIS — F319 Bipolar disorder, unspecified: Secondary | ICD-10-CM | POA: Insufficient documentation

## 2017-05-23 DIAGNOSIS — R410 Disorientation, unspecified: Secondary | ICD-10-CM | POA: Insufficient documentation

## 2017-05-23 LAB — CBC WITH DIFFERENTIAL/PLATELET
BASOS ABS: 0 10*3/uL (ref 0.0–0.1)
Basophils Relative: 0 %
EOS PCT: 2 %
Eosinophils Absolute: 0.1 10*3/uL (ref 0.0–0.7)
HCT: 41.1 % (ref 36.0–46.0)
HEMOGLOBIN: 13 g/dL (ref 12.0–15.0)
LYMPHS PCT: 30 %
Lymphs Abs: 1 10*3/uL (ref 0.7–4.0)
MCH: 29.7 pg (ref 26.0–34.0)
MCHC: 31.6 g/dL (ref 30.0–36.0)
MCV: 93.8 fL (ref 78.0–100.0)
Monocytes Absolute: 0.3 10*3/uL (ref 0.1–1.0)
Monocytes Relative: 10 %
NEUTROS ABS: 2 10*3/uL (ref 1.7–7.7)
NEUTROS PCT: 58 %
PLATELETS: 198 10*3/uL (ref 150–400)
RBC: 4.38 MIL/uL (ref 3.87–5.11)
RDW: 12.7 % (ref 11.5–15.5)
WBC: 3.5 10*3/uL — AB (ref 4.0–10.5)

## 2017-05-23 LAB — COMPREHENSIVE METABOLIC PANEL
ALT: 17 U/L (ref 14–54)
ANION GAP: 8 (ref 5–15)
AST: 29 U/L (ref 15–41)
Albumin: 3.8 g/dL (ref 3.5–5.0)
Alkaline Phosphatase: 53 U/L (ref 38–126)
BILIRUBIN TOTAL: 0.8 mg/dL (ref 0.3–1.2)
BUN: 13 mg/dL (ref 6–20)
CO2: 29 mmol/L (ref 22–32)
Calcium: 9.1 mg/dL (ref 8.9–10.3)
Chloride: 103 mmol/L (ref 101–111)
Creatinine, Ser: 0.8 mg/dL (ref 0.44–1.00)
Glucose, Bld: 89 mg/dL (ref 65–99)
POTASSIUM: 3.4 mmol/L — AB (ref 3.5–5.1)
Sodium: 140 mmol/L (ref 135–145)
TOTAL PROTEIN: 7.2 g/dL (ref 6.5–8.1)

## 2017-05-23 LAB — VALPROIC ACID LEVEL: VALPROIC ACID LVL: 85 ug/mL (ref 50.0–100.0)

## 2017-05-23 LAB — BLOOD GAS, VENOUS
Acid-Base Excess: 5.3 mmol/L — ABNORMAL HIGH (ref 0.0–2.0)
Bicarbonate: 28 mmol/L (ref 20.0–28.0)
Drawn by: 2248
FIO2: 21
O2 Saturation: 85.5 %
PCO2 VEN: 52.5 mmHg (ref 44.0–60.0)
PO2 VEN: 54.1 mmHg — AB (ref 32.0–45.0)
pH, Ven: 7.378 (ref 7.250–7.430)

## 2017-05-23 LAB — URINALYSIS, COMPLETE (UACMP) WITH MICROSCOPIC
BILIRUBIN URINE: NEGATIVE
Bacteria, UA: NONE SEEN
GLUCOSE, UA: NEGATIVE mg/dL
HGB URINE DIPSTICK: NEGATIVE
Ketones, ur: 20 mg/dL — AB
LEUKOCYTES UA: NEGATIVE
Nitrite: NEGATIVE
PH: 5 (ref 5.0–8.0)
Protein, ur: 30 mg/dL — AB
SPECIFIC GRAVITY, URINE: 1.026 (ref 1.005–1.030)

## 2017-05-23 LAB — RAPID URINE DRUG SCREEN, HOSP PERFORMED
Amphetamines: NOT DETECTED
Barbiturates: NOT DETECTED
Benzodiazepines: POSITIVE — AB
COCAINE: NOT DETECTED
OPIATES: NOT DETECTED
TETRAHYDROCANNABINOL: NOT DETECTED

## 2017-05-23 LAB — I-STAT BETA HCG BLOOD, ED (MC, WL, AP ONLY): I-stat hCG, quantitative: <5 m[IU]/mL (ref <5)

## 2017-05-23 LAB — CBG MONITORING, ED: GLUCOSE-CAPILLARY: 87 mg/dL (ref 65–99)

## 2017-05-23 LAB — AMMONIA: Ammonia: 16 umol/L (ref 9–35)

## 2017-05-23 LAB — HCG, SERUM, QUALITATIVE: PREG SERUM: POSITIVE — AB

## 2017-05-23 LAB — ETHANOL: Alcohol, Ethyl (B): <5 mg/dL (ref <10)

## 2017-05-23 LAB — LACTIC ACID, PLASMA: Lactic Acid, Venous: 0.9 mmol/L (ref 0.5–1.9)

## 2017-05-23 MED ORDER — LORAZEPAM 1 MG PO TABS
1.0000 mg | ORAL_TABLET | ORAL | Status: AC | PRN
  Administered 2017-05-24: 1 mg via ORAL
  Filled 2017-05-23: qty 1

## 2017-05-23 MED ORDER — SODIUM CHLORIDE 0.9 % IV BOLUS (SEPSIS)
500.0000 mL | Freq: Once | INTRAVENOUS | Status: AC
  Administered 2017-05-23: 500 mL via INTRAVENOUS

## 2017-05-23 MED ORDER — ZIPRASIDONE MESYLATE 20 MG IM SOLR
20.0000 mg | INTRAMUSCULAR | Status: AC | PRN
  Administered 2017-05-24: 20 mg via INTRAMUSCULAR
  Filled 2017-05-23: qty 20

## 2017-05-23 MED ORDER — PREDNISONE 10 MG PO TABS: 20.0000 mg | ORAL_TABLET | Freq: Every day | ORAL | 0 refills | Status: DC

## 2017-05-23 MED ORDER — ZOLPIDEM TARTRATE 5 MG PO TABS
5.0000 mg | ORAL_TABLET | Freq: Every evening | ORAL | Status: DC | PRN
  Administered 2017-05-24 – 2017-05-28 (×4): 5 mg via ORAL
  Filled 2017-05-23 (×4): qty 1

## 2017-05-23 MED ORDER — CLONAZEPAM 0.5 MG PO TABS
0.5000 mg | ORAL_TABLET | Freq: Three times a day (TID) | ORAL | Status: DC
  Administered 2017-05-23 – 2017-05-26 (×7): 1 mg via ORAL
  Administered 2017-05-26: 0.5 mg via ORAL
  Filled 2017-05-23 (×8): qty 2

## 2017-05-23 MED ORDER — HALOPERIDOL 5 MG PO TABS
5.0000 mg | ORAL_TABLET | Freq: Two times a day (BID) | ORAL | Status: DC
  Administered 2017-05-23 – 2017-05-29 (×11): 5 mg via ORAL
  Filled 2017-05-23 (×12): qty 1

## 2017-05-23 MED ORDER — DILTIAZEM HCL ER COATED BEADS 120 MG PO CP24
120.0000 mg | ORAL_CAPSULE | Freq: Every day | ORAL | Status: DC
  Administered 2017-05-24 – 2017-05-29 (×7): 120 mg via ORAL
  Filled 2017-05-23 (×8): qty 1

## 2017-05-23 MED ORDER — RISPERIDONE 1 MG PO TBDP
2.0000 mg | ORAL_TABLET | Freq: Three times a day (TID) | ORAL | Status: DC | PRN
  Administered 2017-05-24 (×2): 2 mg via ORAL
  Filled 2017-05-23 (×2): qty 2

## 2017-05-23 MED ORDER — ONDANSETRON HCL 4 MG PO TABS: 4.0000 mg | ORAL_TABLET | Freq: Three times a day (TID) | ORAL | Status: DC | PRN

## 2017-05-23 NOTE — ED Notes (Signed)
Pt pulled 2nd IV out.

## 2017-05-23 NOTE — ED Notes (Signed)
Pt has pulled out her IVs, pulled off hr cardiac leads  Has eaten 30 per cent of supper   Is currently lying on her L side

## 2017-05-23 NOTE — ED Notes (Signed)
Pt quiet bu restlss, pulling out IVs x 2 in spite of nurses close to bed  She attempts to get out of bed required close obsv

## 2017-05-23 NOTE — ED Notes (Signed)
Awaiting TTS recommendation

## 2017-05-23 NOTE — ED Notes (Signed)
TTS in progress 

## 2017-05-23 NOTE — ED Notes (Signed)
Patient wandering out of room stating that she has to go to the Dr's office to sign papers and pay $400 dollar for her baby. Seeing things and people that are not there.

## 2017-05-23 NOTE — ED Notes (Signed)
Pt set up for dinner

## 2017-05-23 NOTE — ED Notes (Signed)
Pt attempts several times to get OOB, but easily redirected.  Pulled off cardiac leads.

## 2017-05-23 NOTE — ED Notes (Signed)
TTS complete 

## 2017-05-23 NOTE — ED Notes (Signed)
Pt is asking for her sister and son.  Says, "I am ready to go home".  Informed pt that I spoke with Danae Chen (caregiver).

## 2017-05-23 NOTE — ED Notes (Signed)
Patient sitting at the foot of the bed at this time. Patient states that her sister is leaving her here. Reminded patient that her sister is not here at the Hospital. Patient talking to people that are not there.

## 2017-05-23 NOTE — ED Notes (Signed)
Pt has ambulated to bathroom several times since arrival.

## 2017-05-23 NOTE — ED Notes (Signed)
Patient asking for food at this time. Patient doesn't want peanut butter or graham crackers. Asking for a sandwich. RN made aware of this.

## 2017-05-23 NOTE — ED Provider Notes (Signed)
[facility] EMERGENCY DEPARTMENT Provider Note   CSN: [id number] Arrival date & time: 05/23/17  1432     History   Chief Complaint Chief Complaint  Patient presents with  . Altered Mental Status  5 caveat series obtained from EMS with patient unable to give history.  HPI [name] Ball is a 51 y.o. female.  HPI This is a 51 year old female history of schizoaffective disorder sent from group home with reports that she has been increasingly confused and is now unable to do her usual self care.  EMS does not report any acute events.  Patient is able to answer only some questions but does not seem able to tell us where she is or why she is here Past Medical History:  Diagnosis Date  . Bipolar disorder ([place])   . GERD (gastroesophageal reflux disease)   . Hepatitis C virus infection without hepatic coma     Patient Active Problem List   Diagnosis Date Noted  . Bipolar disorder, current episode manic, severe with psychotic features ([place]) 04/03/2017  . Hepatitis C virus infection without hepatic coma   . Bipolar disorder ([place])   . GERD (gastroesophageal reflux disease)     Past Surgical History:  Procedure Laterality Date  . abdominal cyst removed    . CESAREAN SECTION    . COLON SURGERY      OB History    No data available       Home Medications    Prior to Admission medications   Medication Sig Start Date End Date Taking? Authorizing Provider  ARIPiprazole (ABILIFY) 20 MG tablet Take 1 tablet (20 mg total) by mouth daily. For mood control 04/27/17   Lindell Spar I, NP  benztropine (COGENTIN) 0.5 MG tablet Take 1 tablet (0.5 mg total) by mouth 2 (two) times daily. For prevention of drug induced tremors 04/26/17   Lindell Spar I, NP  diltiazem (DILACOR XR) 120 MG 24 hr capsule Take 1 capsule (120 mg total) by mouth daily. For high blood pressure 04/26/17   Nwoko, Herbert Pun I, NP  divalproex (DEPAKOTE) 500 MG DR tablet Take 2 tablets (1,000 mg total) by mouth at bedtime.  For mood stabilization 04/26/17   Lindell Spar I, NP  haloperidol (HALDOL) 5 MG tablet Take 1 tablet (5 mg total) by mouth 2 (two) times daily. For mood control 04/26/17   Lindell Spar I, NP  haloperidol decanoate (HALDOL DECANOATE) 100 MG/ML injection Inject 1.5 mLs (150 mg total) into the muscle every 30 (thirty) days. (Due on 05-06-17): For mood control 05/06/17   Lindell Spar I, NP  ibuprofen (ADVIL,MOTRIN) 200 MG tablet Take 1-2 tablets (200-400 mg total) by mouth every 4 (four) hours as needed for moderate pain. 04/26/17   Lindell Spar I, NP  nicotine (NICODERM CQ - DOSED IN MG/24 HR) 7 mg/24hr patch Place 1 patch (7 mg total) onto the skin daily. (May purchase from over the counter): For smoking cessation 04/27/17   Lindell Spar I, NP  pantoprazole (PROTONIX) 20 MG tablet Take 1 tablet (20 mg total) by mouth daily. For acid reflux 04/26/17   Lindell Spar I, NP  zolpidem (AMBIEN) 5 MG tablet Take 1 tablet (5 mg total) by mouth at bedtime as needed for sleep. 04/26/17   Encarnacion Slates, NP    Family History History reviewed. No pertinent family history.  Social History Social History   Tobacco Use  . Smoking status: Current Every Day Smoker    Packs/day: 1.00    Types:  Cigarettes  . Smokeless tobacco: Never Used  Substance Use Topics  . Alcohol use: No  . Drug use: No     Allergies   Trazodone and nefazodone   Review of Systems Review of Systems  Unable to perform ROS: Mental status change     Physical Exam Updated Vital Signs BP 124/68 (BP Location: Left Arm) Comment: Simultaneous filing. User may not have seen previous data. Comment (BP Location): Simultaneous filing. User may not have seen previous data.  Pulse 78 Comment: Simultaneous filing. User may not have seen previous data.  Temp 97.8 F (36.6 C) (Oral) Comment: Simultaneous filing. User may not have seen previous data. Comment (Src): Simultaneous filing. User may not have seen previous data.  Resp 18 Comment:  Simultaneous filing. User may not have seen previous data.  Ht 1.676 m (5\' 6" )   Wt 81.2 kg (179 lb)   SpO2 100% Comment: Simultaneous filing. User may not have seen previous data.  BMI 28.89 kg/m   Physical Exam  Constitutional: She appears well-developed and well-nourished. No distress.  HENT:  Head: Normocephalic and atraumatic.  Right Ear: External ear normal.  Left Ear: External ear normal.  Mouth/Throat: Oropharynx is clear and moist.  Eyes: EOM are normal. Pupils are equal, round, and reactive to light.  Neck: Normal range of motion.  Cardiovascular: Normal rate and regular rhythm.  Pulmonary/Chest: Effort normal and breath sounds normal.  Abdominal: Soft. Bowel sounds are normal.  Musculoskeletal: Normal range of motion.  Neurological: She is alert.  Skin: Skin is warm. Capillary refill takes less than 2 seconds.  Nursing note and vitals reviewed.    ED Treatments / Results  Labs (all labs ordered are listed, but only abnormal results are displayed) Labs Reviewed - No data to display  EKG  EKG Interpretation None       Radiology No results found.  Procedures Procedures (including critical care time)  Medications Ordered in ED Medications - No data to display   Initial Impression / Assessment and Plan / ED Course  I have reviewed the triage vital signs and the nursing notes.  Pertinent labs & imaging results that were available during my care of the patient were reviewed by me and considered in my medical decision making (see chart for details).      This is a 51 year old female with history of bipolar affective disorder who presents today with reports that she has had change in behavior with inability to care for herself any responsive than usual.  Here she appears somewhat confused and is not answering questions appropriately.  Medical workup reveals no evidence of acute abnormality with head CT obtained without acute changes electrolytes are normal,  ammonia is normal, urinalysis did not show any evidence of acute infection and she is mildly leukopenic with a white count of 3500.  Plan behavioral health consult for medication management. Discussed with caregiver at group [employer].  Caregiver states bhh has changed klonopin from 1 mg bid with2 mg qhs now 1/2 bid and 1 mg qhs , was on seroquel 50 mg now 25 mg tid, haldol 5 mg bid, now 5 mg tid two weeks ago.   She reports that patient has been more confused with two loose stools today in her clothes.  Behavioral health has assessed the patient and recommends inpatient behavioral health placement Final Clinical Impressions(s) / ED Diagnoses   Final diagnoses:  Confusion  Bipolar affective disorder, remission status unspecified [facility])    ED  Discharge Orders    None       Pattricia Boss, MD 05/23/17 2004

## 2017-05-23 NOTE — ED Notes (Signed)
Wand by security.

## 2017-05-23 NOTE — ED Triage Notes (Signed)
Pt here from group home for evaluation of increased wandering, rambling thoughts, decline in ability to take care of herself, and speaking about things not actually occurring.

## 2017-05-23 NOTE — BH Assessment (Addendum)
Tele Assessment Note   Patient Name: [name] Ball MRN: [id number] Referring Physician: Dr Pattricia Boss  Location of Patient: [facility] Location of Provider: [facility]  [name] Ball is an 51 y.o. female. Per ED notes, pt here from group home for evaluation of increased wandering, rambling thoughts, decline in ability to take care of herself, and speaking about things not actually occurring. Per chart review, pt was d/c from [place] on 04/26/17 after 3 week admission for bipolar I disorder. Pt presents voluntarily BIB EMS. Her speech is quite garbled and incoherent at times. [name] Ball reports pt has new dentures but refuses to wear them. Pt has large white bandage on R wrists where she has pulled out her IV twice.  She is oriented to her name but isn't oriented to birth date, location or situation. Pt is poor historian as she is confused and in a somewhat altered mental status. Pt says a couple of times that she is ready to go home. When writer asks if pt works for [employer], pt says, "No, I see images". She goes on to say she "dots".   Writer obtained collateral info from [name] Ball [phone]. She reports pt's baseline is being oriented and interacting with staff and residents. [name] Ball says over the past few days, pt has not been interacting. She has been "fighting sleep" and refusing to take her psych meds. She states that pt has tried to sneak out of the family care home and she has also tried to wash her clothes in the bathroom tub and sink.  [name] Ball reports pt's [facility]CT is with [facility] and her psychiatrist is Dr [name] Ball with [facility]. She says pt's most recent med changes were 11/20 & 11/16 for psych meds (klonopin & haldol). She says pt has lived at [facility] for past 2 years at least. She is typically independent with her ADLs but over the past few days, [name] Ball has had to asisst pt with bathing. She says she has to give pt  specific instructions like to pick up the glass and put water in her mouth prior to taking a pill. [name] Ball reports pt thinks she has 10 kids and she works for the [employer].   Diagnosis: Unspecified schizophrenia spectrum and other psychotic disorder  Past Medical History:  Past Medical History:  Diagnosis Date  . Bipolar disorder ([place])   . GERD (gastroesophageal reflux disease)   . Hepatitis C virus infection without hepatic coma     Past Surgical History:  Procedure Laterality Date  . abdominal cyst removed    . CESAREAN SECTION    . COLON SURGERY      Family History: History reviewed. No pertinent family history.  Social History:  reports that she has been smoking cigarettes.  She has been smoking about 1.00 pack per day. she has never used smokeless tobacco. She reports that she does not drink alcohol or use drugs.  Additional Social History:  Alcohol / Drug Use Pain Medications: none per chart hx - see pta meds list Prescriptions: none per chart hx - see pta meds list Over the Counter: none per chart hx - see pta meds list History of alcohol / drug use?: No history of alcohol / drug abuse  CIWA: CIWA-Ar BP: 124/68(Simultaneous filing. User may not have seen previous data.) Pulse Rate: 68 COWS:    PATIENT STRENGTHS: (choose at least two) Physical Health Other: Support[facility] Family Care Home  Allergies:  Allergies  Allergen Reactions  . Trazodone  And Nefazodone     Pt stated doesn't make her feel good    Home Medications:  (Not in a hospital admission)  OB/GYN Status:  No LMP recorded. Patient is postmenopausal.  General Assessment Data Location of Assessment: [facility] TTS Assessment: In system Is this a Tele or Face-to-Face Assessment?: Tele Assessment Is this an Initial Assessment or a Re-assessment for this encounter?: Initial Assessment Marital status: Single Is patient pregnant?: No Pregnancy Status: No Living Arrangements: Other (Comment)([facility]  family care home) Can pt return to current living arrangement?: Yes Admission Status: Voluntary Is patient capable of signing voluntary admission?: No Referral Source: Other(family care home) Insurance type: medicaid     Crisis Care Plan Living Arrangements: Other (Comment)([facility]) Legal Guardian: (sister [name] Ball [phone]) Name of Psychiatrist: Johnn Ball - [facility] Name of Therapist: [facility]  Education Status Is patient currently in school?: No  Risk to self with the past 6 months Suicidal Ideation: Yes-Currently Present Has patient been a risk to self within the past 6 months prior to admission? : (unable to assess ) Suicidal Intent: (unable to assess) Has patient had any suicidal intent within the past 6 months prior to admission? : (unable to assess) Is patient at risk for suicide?: No Suicidal Plan?: (unable to assess) Has patient had any suicidal plan within the past 6 months prior to admission? : (unable to assess) Access to Means: No What has been your use of drugs/alcohol within the last 12 months?: none Previous Attempts/Gestures: (unable to assess) Other Self Harm Risks: (unable to assess) Triggers for Past Attempts: (unable to assess) Intentional Self Injurious Behavior: (unable to assess) Family Suicide History: Unable to assess Recent stressful life event(s): (unable to assess) Persecutory voices/beliefs?: (unable to assess) Depression: (unable to assess) Depression Symptoms: (unable to assess) Substance abuse history and/or treatment for substance abuse?: No Suicide prevention information given to non-admitted patients: Not applicable  Risk to Others within the past 6 months Homicidal Ideation: (unable to assess) Does patient have any lifetime risk of violence toward others beyond the six months prior to admission? : No Thoughts of Harm to Others: (unable to assess) Current Homicidal Intent: (unable  to assess) Current Homicidal Plan: (unable to assess) Access to Homicidal Means: No Identified Victim: unable to assess History of harm to others?: No Assessment of Violence: None Noted Violent Behavior Description: (unable to assess - family care home staff no hx of violence) Does patient have access to weapons?: No Criminal Charges Pending?: No Does patient have a court date: No Is patient on probation?: No  Psychosis Hallucinations: Visual(pt says she sees dot in front of her eys?) Delusions: (unable to assess)  Mental Status Report Appearance/Hygiene: Disheveled Eye Contact: Poor Motor Activity: Freedom of movement Speech: Slurred, Incoherent(garbled ) Level of Consciousness: Quiet/awake Mood: (unable to assess) Affect: Preoccupied Anxiety Level: None Thought Processes: Thought Blocking, Tangential Judgement: Impaired Orientation: Person Obsessive Compulsive Thoughts/Behaviors: Unable to Assess  Cognitive Functioning Concentration: Unable to Assess Memory: Recent Impaired, Remote Impaired IQ: Average Insight: Poor Impulse Control: Poor Appetite: Poor Sleep: Decreased Vegetative Symptoms: Decreased grooming  ADLScreening [facility] Assessment Services) Patient's cognitive ability adequate to safely complete daily activities?: No Patient able to express need for assistance with ADLs?: No Independently performs ADLs?: No  Prior Inpatient Therapy Prior Inpatient Therapy: Yes Prior Therapy Dates: Oct 2018 Prior Therapy Facilty/Provider(s): [facility] Reason for Treatment: bipolar disorder w/ psychosis  Prior Outpatient Therapy Prior Outpatient Therapy: Yes Prior Therapy Dates:  currently Prior Therapy Facilty/Provider(s): [facility] Reason for Treatment: [facility] Does patient have an ACCT team?: Yes Does patient have Intensive In-House Services?  : No Does patient have [facility] services? : Unknown Does patient have P4CC services?: Unknown  ADL Screening  (condition at time of admission) Patient's cognitive ability adequate to safely complete daily activities?: No Is the patient deaf or have difficulty hearing?: No Does the patient have difficulty seeing, even when wearing glasses/contacts?: No Does the patient have difficulty concentrating, remembering, or making decisions?: Yes Patient able to express need for assistance with ADLs?: No Does the patient have difficulty dressing or bathing?: Yes Independently performs ADLs?: No Communication: Independent Dressing (OT): Needs assistance Is this a change from baseline?: Change from baseline, expected to last <3days Feeding: Independent Bathing: Needs assistance Is this a change from baseline?: Change from baseline, expected to last <3 days Toileting: Independent In/Out Bed: Independent Walks in Home: Independent Does the patient have difficulty walking or climbing stairs?: No Weakness of Legs: None Weakness of Arms/Hands: None  Home Assistive Devices/Equipment Home Assistive Devices/Equipment: Dentures (specify type)    Abuse/Neglect Assessment (Assessment to be complete while patient is alone) Abuse/Neglect Assessment Can Be Completed: Unable to assess, patient is non-responsive or altered mental status     Advance Directives (For Healthcare) Does Patient Have a Medical Advance Directive?: No Would patient like information on creating a medical advance directive?: No - Patient declined    Additional Information 1:1 In Past 12 Months?: Yes CIRT Risk: No Elopement Risk: Yes Does patient have medical clearance?: Yes     Disposition:  Disposition Initial Assessment Completed for this Encounter: Yes Disposition of Patient: Inpatient treatment program Type of inpatient treatment program: Adult(shuvon rankin np recommend inpatient treatment)  This service was provided via telemedicine using a 2-way, interactive audio and video technology.  Names of all persons participating  in this telemedicine service and their role in this encounter. Name: Jaymi Downie pt  Daphyne Pt's RN  [place] assessor  Name:      Nyoka Lint 05/23/2017 5:47 PM

## 2017-05-24 NOTE — ED Notes (Signed)
Patient lying down for about 5 minutes and up out of bed. Patient states that she wants to go home and when she does go she will never come back this place. Reminded patient that it is 0225 am in the morning.Patient yelling out for people that are not here.

## 2017-05-24 NOTE — ED Notes (Signed)
Nurse offered to heat up frozen dinner for pt. Pt said she did not want to eat the meal. Pt laying in bed looking at floor.

## 2017-05-24 NOTE — BHH Counselor (Signed)
05/24/17:  Pt reassessed this AM.  She came to the ED on 05/23/17 for altered mental status. Pt was reassessed.  She refused to answer questions, stared, mumbled, and wandered on and off of camera.  Pt did not acknowledge author.  At this time, must recommend continued inpatient.   11.22.18 Assessment: [name] Ball is an 51 y.o. female. Per ED notes, pt here from group home for evaluation of increased wandering, rambling thoughts, decline in ability to take care of herself, and speaking about things not actually occurring. Per chart review, pt was d/c from [facility] on 04/26/17 after 3 week admission for bipolar I disorder. Pt presents voluntarily BIB EMS. Her speech is quite garbled and incoherent at times. [name] reports pt has new dentures but refuses to wear them. Pt has large white bandage on R wrists where she has pulled out her IV twice.  She is oriented to her name but isn't oriented to birth date, location or situation. Pt is poor historian as she is confused and in a somewhat altered mental status. Pt says a couple of times that she is ready to go home. When writer asks if pt works for [employer], pt says, "No, I see images". She goes on to say she "dots".

## 2017-05-24 NOTE — ED Notes (Signed)
PT reevaluated by [facility]

## 2017-05-24 NOTE — ED Notes (Signed)
Patient crawling in and out of bed. Redirected patient to get some rest at this time. Patient is lying down at this time.

## 2017-05-24 NOTE — ED Notes (Signed)
Patient stated that she needed to go to restroom, once out of room patient refused to use restroom or go back to room. RN Ron helped assist patient back to room.

## 2017-05-24 NOTE — Progress Notes (Signed)
Patient meets criteria for inpatient treatment. CSW faxed referrals to the following inpatient facilities for review:  [facility], [facility], [facility], [facility], [place], [place], [facility], [facility], [facility], [facility]    TTS will continue to seek bed placement.   Radonna Ricker MSW, [place] Disposition [phone]

## 2017-05-24 NOTE — ED Notes (Signed)
Pt started wandering and attempted to go into another pts room. Sitter attempted to redirect and pt grabbed her arm and pushed staff. Able to be directed back into room by nurse

## 2017-05-25 MED ORDER — ZIPRASIDONE MESYLATE 20 MG IM SOLR
20.0000 mg | INTRAMUSCULAR | Status: AC | PRN
  Administered 2017-05-26: 20 mg via INTRAMUSCULAR
  Filled 2017-05-25: qty 20

## 2017-05-25 MED ORDER — RISPERIDONE 1 MG PO TBDP
2.0000 mg | ORAL_TABLET | Freq: Three times a day (TID) | ORAL | Status: DC | PRN
  Administered 2017-05-25 – 2017-05-27 (×3): 2 mg via ORAL
  Filled 2017-05-25 (×3): qty 2

## 2017-05-25 MED ORDER — LORAZEPAM 1 MG PO TABS
1.0000 mg | ORAL_TABLET | ORAL | Status: AC | PRN
  Administered 2017-05-25: 1 mg via ORAL
  Filled 2017-05-25: qty 1

## 2017-05-25 NOTE — ED Provider Notes (Signed)
Pt wandering around the ED, agitated, stating she wants to leave. Walking toward the ambulance doors. Inpatient psych placement pending.  IVC paperwork completed.    Francine Graven, DO 05/25/17 1459

## 2017-05-25 NOTE — ED Notes (Signed)
Placed under IVC, wants to leave

## 2017-05-25 NOTE — BHH Counselor (Signed)
Patient re-assessed this a.m. Patient was able to express she was in the hospital, however, patient does not know why she's in the hospital. When asked do you know where you was staying before coming to the hospital patient replied, "the group home."   Patient then started rambling, it was hard for TTS worker to follow patient due to hard to understand patient speech. Patient spoke in a soft tone and incoherent at times.   Patient continues to meet inpatient criteria, Per Ricky Ala, NP

## 2017-05-25 NOTE — ED Notes (Signed)
IVC papers faxed to [facility].

## 2017-05-25 NOTE — ED Notes (Signed)
Ran outside of the ED, [employer] called, returned to room and is quiet in bed

## 2017-05-26 LAB — HCG, QUANTITATIVE, PREGNANCY: HCG, BETA CHAIN, QUANT, S: 4 m[IU]/mL (ref <5)

## 2017-05-26 MED ORDER — STERILE WATER FOR INJECTION IJ SOLN
INTRAMUSCULAR | Status: AC
  Administered 2017-05-27: 1.2 mL
  Filled 2017-05-26: qty 10

## 2017-05-26 MED ORDER — DIVALPROEX SODIUM 250 MG PO DR TAB
500.0000 mg | DELAYED_RELEASE_TABLET | Freq: Two times a day (BID) | ORAL | Status: DC
  Administered 2017-05-26 – 2017-05-28 (×5): 500 mg via ORAL
  Filled 2017-05-26 (×6): qty 2

## 2017-05-26 MED ORDER — CLONAZEPAM 0.5 MG PO TABS
ORAL_TABLET | ORAL | Status: AC
  Filled 2017-05-26: qty 2

## 2017-05-26 MED ORDER — BENZTROPINE MESYLATE 1 MG PO TABS
0.5000 mg | ORAL_TABLET | Freq: Two times a day (BID) | ORAL | Status: DC
  Administered 2017-05-26 – 2017-05-29 (×6): 0.5 mg via ORAL
  Filled 2017-05-26 (×6): qty 1

## 2017-05-26 MED ORDER — LORAZEPAM 1 MG PO TABS
1.0000 mg | ORAL_TABLET | Freq: Four times a day (QID) | ORAL | Status: DC | PRN
  Administered 2017-05-26 – 2017-05-29 (×3): 1 mg via ORAL
  Filled 2017-05-26 (×2): qty 1

## 2017-05-26 MED ORDER — RISPERIDONE 1 MG PO TABS
ORAL_TABLET | ORAL | Status: AC
  Filled 2017-05-26: qty 2

## 2017-05-26 MED ORDER — LORAZEPAM 1 MG PO TABS
1.0000 mg | ORAL_TABLET | Freq: Three times a day (TID) | ORAL | Status: DC | PRN
  Administered 2017-05-26: 1 mg via ORAL
  Filled 2017-05-26: qty 1

## 2017-05-26 NOTE — ED Notes (Signed)
Security called due to Pt trying to leave

## 2017-05-26 NOTE — ED Notes (Signed)
Pt still pacing in room.  Walked around the nursing station for approx 10 minutes.

## 2017-05-26 NOTE — ED Notes (Signed)
Agitated, pacing around in room, yelling out random things.  Attempted to redirect

## 2017-05-26 NOTE — ED Notes (Signed)
TTS in progress 

## 2017-05-26 NOTE — ED Notes (Signed)
Pt standing in doorway yelling into hall saying her lunch is here. Pt told lunch doesn't come until 12:30-1. Pt states tech is lying and that her food is in the hall getting cold. Pt refuses to get out of doorway. Security at room attempting to get pt to calm down.

## 2017-05-26 NOTE — ED Notes (Signed)
Pt continues to stand in doorway of room- [employer] standing in front of pt- pt continues to state, "she has to leave and go with her sister to check on her kids".

## 2017-05-26 NOTE — ED Notes (Signed)
Pt sleeping at this time.

## 2017-05-26 NOTE — ED Notes (Signed)
Pt ate 100% of tray

## 2017-05-26 NOTE — ED Notes (Signed)
Tech and security took pt to shower but pt refused and said she had one today already. Tech informed pt she hadn't had a shower today, pt insisted that she has. Escorted back to room.

## 2017-05-26 NOTE — ED Notes (Signed)
Pt given lunch tray, pt appears to be in better mood now.

## 2017-05-26 NOTE — ED Notes (Signed)
Pt agitated.  Yelling at sitter, pacing back and forth in room

## 2017-05-26 NOTE — ED Notes (Signed)
Pt taken to shower by tech, nurse, and security

## 2017-05-26 NOTE — ED Provider Notes (Signed)
TTS called for meds recommendations:  Consult completed, meds ordered.    Francine Graven, DO 05/26/17 2104

## 2017-05-26 NOTE — ED Notes (Addendum)
Pt is slowly becoming more agitated. Pt states she has to go home and "I have to go to catch my ride". RN aware.

## 2017-05-26 NOTE — BH Assessment (Signed)
Pt was awake and alert for re-assessment. Pt was more oriented to family but still reported she had just had a baby. Pt was asking where her baby was. Pt reported she slept and ate well. Pt stated "she wanted to go home". Pt mentioned her ACTT team. Pt denied SI and HI. Pt still meets criteria for inpatient services.

## 2017-05-26 NOTE — Consult Note (Signed)
  Chart was reviewed for medicaitons - valproic acid on 11/22 =85 - consider restarting home medication: ie Depakote 500 mg PO BID ( of note Haldol injection was due on 05/06/2017 please contact group home to see if this was given and schedule )  - Ativan 1mg  Q 6 PRN -Cogentin 0.5mg  BID for EPS

## 2017-05-26 NOTE — ED Notes (Signed)
Pt ambulatory to bathroom and back to room escorted by sitter. 

## 2017-05-26 NOTE — ED Notes (Signed)
Pt pushed table out of door way and walked into another pts room. Security called and at door with pt.

## 2017-05-26 NOTE — ED Notes (Signed)
Asked pt multiple times about taking a shower, pt uncooperative.

## 2017-05-26 NOTE — ED Notes (Signed)
Pt becoming agitated insisting she does not have to stay in room. Unable to redirect . [employer] assisted to get pt back in room

## 2017-05-26 NOTE — ED Notes (Signed)
Taken to shower

## 2017-05-27 ENCOUNTER — Inpatient Hospital Stay (HOSPITAL_COMMUNITY): Admission: AD | Admit: 2017-05-27 | Payer: Medicaid Other | Source: Intra-hospital | Admitting: Psychiatry

## 2017-05-27 MED ORDER — LOPERAMIDE HCL 2 MG PO CAPS
4.0000 mg | ORAL_CAPSULE | Freq: Once | ORAL | Status: AC
  Administered 2017-05-27: 4 mg via ORAL
  Filled 2017-05-27: qty 2

## 2017-05-27 MED ORDER — ZIPRASIDONE MESYLATE 20 MG IM SOLR
10.0000 mg | Freq: Two times a day (BID) | INTRAMUSCULAR | Status: DC | PRN
Start: 2017-05-27 — End: 2017-05-29
  Administered 2017-05-27: 10 mg via INTRAMUSCULAR
  Filled 2017-05-27: qty 20

## 2017-05-27 MED ORDER — ZIPRASIDONE MESYLATE 20 MG IM SOLR
INTRAMUSCULAR | Status: AC
  Filled 2017-05-27: qty 20

## 2017-05-27 MED ORDER — STERILE WATER FOR INJECTION IJ SOLN
INTRAMUSCULAR | Status: AC
  Filled 2017-05-27: qty 10

## 2017-05-27 MED ORDER — ZIPRASIDONE MESYLATE 20 MG IM SOLR
10.0000 mg | Freq: Once | INTRAMUSCULAR | Status: AC
  Administered 2017-05-27: 10 mg via INTRAMUSCULAR

## 2017-05-27 NOTE — ED Notes (Signed)
Assuming care of patient at this time.

## 2017-05-27 NOTE — ED Notes (Signed)
RN called to room by sitter. Pt had two oval tablets in hand. RN asked pt for tablets, pt let RN have tablets initially with no problem then became hostile and asked for tablets back. After speaking with pharmacy-tablets may have been Depakote 250mg  po tablet medication that pt removed coating from then spit out. Pills disposed of.

## 2017-05-27 NOTE — ED Notes (Signed)
Patient becoming more agitated. She is pacing in the room and still trying to leave the room sometimes she is easily redirected and other times she's not. She keeps saying that she has to get her children.

## 2017-05-27 NOTE — ED Notes (Signed)
Care assumed by christy, rn

## 2017-05-27 NOTE — ED Notes (Addendum)
Patient talking to people that aren't here. Asking for her husband, son and children. She still continues to try and leave the room by coming to doorway where I'm sitting trying to push table out the way so she can leave the room.

## 2017-05-27 NOTE — ED Notes (Signed)
Patient awake and walking around room and trying to leave the room.

## 2017-05-27 NOTE — ED Notes (Addendum)
Patient had some pills in her hand, she wouldn't give them to me I notified Tiffany RN and she got the pills away from her.

## 2017-05-27 NOTE — ED Notes (Signed)
Pt escorted to shower with sitter and security as pt had BM at some point and had smeared it all over her room and on her linens. Room cleaned and linen changed as pt showered.

## 2017-05-27 NOTE — Care Management (Signed)
Writer discussed disposition with the [facility] Ria Comment) and the patient no longer has a bed a [facility] due to aggression and no private room.    Writer referred patient to the following facilities [place],  [place]   The following facilities are at [place]

## 2017-05-27 NOTE — ED Notes (Signed)
Patient got out of room into hallway being aggressive with staff. [employer] and Mickle Plumb came to assist patient back to room. RPD got patient in bed and she is laying in bed at this time.

## 2017-05-27 NOTE — ED Notes (Signed)
Brooke Mcnichol, rn has been given report at this time. Brooke stating room is still not ready and nighttime AC will provide information when room is ready

## 2017-05-27 NOTE — Progress Notes (Signed)
Per Angelina Ok , [facility], patient has been accepted to [facility], bed 502-1 ; Accepting provider is Elmarie Shiley, NP; Attending provider is Dr. Nancy Fetter.  Number for report is [phone].   [facility] will notify the patient's RN of a transport time.    Milford Cage, RN notified.     Radonna Ricker MSW, [place] Disposition [phone]

## 2017-05-27 NOTE — ED Notes (Signed)
Guardian, DSS, Synphanie williams.  [phone], gave permission to transfer to [facility].

## 2017-05-27 NOTE — ED Notes (Signed)
Pt continues to be restless. Keeps coming out of room and has to be escorted back. Dr. Stark Jock made aware and new orders given. Will continue to monitor.

## 2017-05-27 NOTE — BH Assessment (Signed)
[place] Assessment Progress Note  Pt reassessed this morning. Pt is pleasant and cooperative. She provided her name and DOB. She reports sleeping "wonderful". Pt began to talk about vessels in the water and said she would like for more vessels to be out there when it's hot. Progress notes, as early as under an hour ago, indicate that pt has been presenting with continued psychotic behaviors and labile moods. As such, pt will continue to be recommended for IP treatment.   Kenna Gilbert. Lovena Le, [place], [place], [facility] Counselor

## 2017-05-27 NOTE — ED Notes (Signed)
Disposition Education officer, museum, jolan williams, informed this RN that pt has been accepted at [facility] [place]; however, patient will not be able to be transported until after shift change due to bed not being available. Social worker informed this RN that [facility] will contact nursing staff after shift change with instructions for room and transport. Accepting providers are laura davis, NP and Attending:  [place]. Number for report [phone]

## 2017-05-27 NOTE — ED Notes (Addendum)
Patient is still being aggressive. She tore her pillow up. Pillow was taken away. Still trying to leave and trying to push her way out of the room. [employer] came to assist. Patient is currently in bed with handcuffs on left wrist. Nurse aware.

## 2017-05-28 LAB — BASIC METABOLIC PANEL
ANION GAP: 8 (ref 5–15)
BUN: 19 mg/dL (ref 6–20)
CO2: 27 mmol/L (ref 22–32)
CREATININE: 0.65 mg/dL (ref 0.44–1.00)
Calcium: 9.3 mg/dL (ref 8.9–10.3)
Chloride: 105 mmol/L (ref 101–111)
GFR calc non Af Amer: >60 mL/min (ref >60)
Glucose, Bld: 97 mg/dL (ref 65–99)
Potassium: 4.4 mmol/L (ref 3.5–5.1)
SODIUM: 140 mmol/L (ref 135–145)

## 2017-05-28 MED ORDER — SODIUM CHLORIDE 0.9 % IV BOLUS (SEPSIS)
1000.0000 mL | Freq: Once | INTRAVENOUS | Status: AC
  Administered 2017-05-28: 1000 mL via INTRAVENOUS

## 2017-05-28 MED ORDER — DILTIAZEM HCL 30 MG PO TABS
ORAL_TABLET | ORAL | Status: AC
  Filled 2017-05-28: qty 4

## 2017-05-28 NOTE — ED Notes (Signed)
Pt resting with eyes closed, appears to be in no distress. Respirations are even and unlabored. Sitter and RPD at bedside.

## 2017-05-28 NOTE — ED Notes (Signed)
Pt ran out of room down hallway. Staff stopped pt and redirected her back to her room. Law inforcement outside of pt's room.

## 2017-05-28 NOTE — BHH Counselor (Addendum)
TC from pt's RN Judson Roch. Pt fell in her room and hit her head on counter. Pt didn't lose consciousness. EDP checked on pt. RN reports pt is receiving L of fluids and basic metabolic panel being rechecked. Rn reports officer is now sitting w/ pt as pt's sitter was non clinical (couldn't physically stop pt if pt tried to elope).  TTS AC now aware.   Arnold Long, [place] Therapeutic Triage Specialist

## 2017-05-28 NOTE — ED Notes (Signed)
[place] and [facility] AC notified about fall, NS bolus, and lab test ordered.

## 2017-05-28 NOTE — ED Notes (Signed)
Sitter keeping patient in room, pt states she will take her medication with orange juice, continuing to ask to go out to smoke

## 2017-05-28 NOTE — Care Management (Signed)
Writer followed up on the following facilities: [facility] coordinator has left for the evening.  Instructed to call back in the morning.   [facility] - at capacity per [employer]- per Linus Orn denies due to medical acuity [facility] - Per Mitzi Hansen denied due to low functioning [place] - per intake spec the referral has not been reviewed [facility] - the nurse is still reviewing the referral

## 2017-05-28 NOTE — ED Notes (Signed)
[place] notified BMP is all WNL, will call with decision on placement.

## 2017-05-28 NOTE — ED Notes (Signed)
Pt given dinner tray at this time.  

## 2017-05-28 NOTE — ED Provider Notes (Signed)
I was asked to check on the patient after having a fall in her room.  Apparently the patient has had some diarrhea today.  She bumped her head when she fell.  Patient did not lose consciousness.  She denies any trouble with any chest pain, neck pain in any extremity pain.  We were able to a sisters to her feet and back into the bed.  No signs of any injury.  No bony deformity to the face, no hematoma noted.  Patient's vital signs did show tachycardia this morning.  I will order some IV fluids and have her metabolic panel rechecked.   Dorie Rank, MD 05/28/17 [phone]

## 2017-05-28 NOTE — ED Notes (Addendum)
Tc to Barnett Applebaum [facility] to see if pt is going to [facility], per Barnett Applebaum she has contacted [facility] [place] spoke with Herbert Spires, [facility] was in report, will call back and called again at 2107 spoke with Herbert Spires, [facility]  was in pt room with pt and will call back, no call back or update on placement yet

## 2017-05-28 NOTE — ED Notes (Signed)
Pt eating lunch tray at this time  

## 2017-05-28 NOTE — ED Notes (Addendum)
Per sitter and RPD pt had a witnessed fall hitting her head on the counter, no LOC. MD Tomi Bamberger evaluated pt after this event. Noted to have an episode of D/, pt cleaned up and new scrubs provided.

## 2017-05-28 NOTE — ED Notes (Signed)
Pt assisted to restroom at this time.

## 2017-05-28 NOTE — ED Notes (Signed)
RPD called at this time d/t pt getting out of her room and stating she is going to leave.

## 2017-05-28 NOTE — BH Assessment (Addendum)
Pt reassessed. She is pleasant but not oriented. Pt doesn't know her DOB and thinks she is currently in [place] says she slept well. Freight forwarder Josh reports pt didn't sleep much last night). She says that her youngest child is 52 yo. Pt denies SI and denies HI. Pt thinks that the writer is actually on a tv program. Pt tells Probation officer that she enjoys writer's "show" and gets a lot out of it. When asked if anyone is out to get her, pt says, "I'm pretty much an attractive lady." Zerita Boers NP recommends inpatient treatment. TTS continues to seek inpatient placement for pt.   Arnold Long, [place] Therapeutic Triage Specialist

## 2017-05-29 ENCOUNTER — Inpatient Hospital Stay (HOSPITAL_COMMUNITY)
Admission: AD | Admit: 2017-05-29 | Discharge: 2017-06-20 | DRG: 885 | Disposition: A | Discharge status: Home / Self Care | Payer: Medicaid Other | Attending: Psychiatry | Admitting: Psychiatry

## 2017-05-29 ENCOUNTER — Other Ambulatory Visit: Payer: Self-pay

## 2017-05-29 ENCOUNTER — Encounter (HOSPITAL_COMMUNITY): Payer: Self-pay

## 2017-05-29 DIAGNOSIS — F419 Anxiety disorder, unspecified: Secondary | ICD-10-CM | POA: Diagnosis not present

## 2017-05-29 DIAGNOSIS — F03918 Unspecified dementia, unspecified severity, with other behavioral disturbance: Secondary | ICD-10-CM

## 2017-05-29 DIAGNOSIS — F0391 Unspecified dementia with behavioral disturbance: Secondary | ICD-10-CM | POA: Diagnosis present

## 2017-05-29 DIAGNOSIS — F312 Bipolar disorder, current episode manic severe with psychotic features: Secondary | ICD-10-CM | POA: Diagnosis present

## 2017-05-29 DIAGNOSIS — Z79899 Other long term (current) drug therapy: Secondary | ICD-10-CM

## 2017-05-29 DIAGNOSIS — K219 Gastro-esophageal reflux disease without esophagitis: Secondary | ICD-10-CM | POA: Diagnosis not present

## 2017-05-29 DIAGNOSIS — F201 Disorganized schizophrenia: Secondary | ICD-10-CM | POA: Diagnosis not present

## 2017-05-29 DIAGNOSIS — Z888 Allergy status to other drugs, medicaments and biological substances status: Secondary | ICD-10-CM

## 2017-05-29 DIAGNOSIS — Z7952 Long term (current) use of systemic steroids: Secondary | ICD-10-CM | POA: Diagnosis not present

## 2017-05-29 DIAGNOSIS — R450 Nervousness: Secondary | ICD-10-CM | POA: Diagnosis not present

## 2017-05-29 DIAGNOSIS — R443 Hallucinations, unspecified: Secondary | ICD-10-CM | POA: Diagnosis not present

## 2017-05-29 DIAGNOSIS — F17210 Nicotine dependence, cigarettes, uncomplicated: Secondary | ICD-10-CM | POA: Diagnosis not present

## 2017-05-29 DIAGNOSIS — F209 Schizophrenia, unspecified: Secondary | ICD-10-CM | POA: Diagnosis present

## 2017-05-29 DIAGNOSIS — G4700 Insomnia, unspecified: Secondary | ICD-10-CM | POA: Diagnosis not present

## 2017-05-29 MED ORDER — LORAZEPAM 1 MG PO TABS
1.0000 mg | ORAL_TABLET | Freq: Four times a day (QID) | ORAL | Status: DC | PRN
  Administered 2017-05-29 – 2017-06-19 (×30): 1 mg via ORAL
  Filled 2017-05-29: qty 1
  Filled 2017-05-29: qty 2
  Filled 2017-05-29 (×29): qty 1

## 2017-05-29 MED ORDER — HALOPERIDOL 5 MG PO TABS
5.0000 mg | ORAL_TABLET | Freq: Two times a day (BID) | ORAL | Status: DC
Start: 2017-05-29 — End: 2017-05-30
  Administered 2017-05-29 – 2017-05-30 (×2): 5 mg via ORAL
  Filled 2017-05-29 (×6): qty 1

## 2017-05-29 MED ORDER — ZIPRASIDONE MESYLATE 20 MG IM SOLR
10.0000 mg | Freq: Two times a day (BID) | INTRAMUSCULAR | Status: DC | PRN
  Administered 2017-06-03 – 2017-06-08 (×9): 10 mg via INTRAMUSCULAR
  Filled 2017-05-29 (×9): qty 20

## 2017-05-29 MED ORDER — MAGNESIUM HYDROXIDE 400 MG/5ML PO SUSP: 30.0000 mL | Freq: Every day | ORAL | Status: DC | PRN

## 2017-05-29 MED ORDER — DILTIAZEM HCL ER COATED BEADS 120 MG PO CP24
120.0000 mg | ORAL_CAPSULE | Freq: Every day | ORAL | Status: DC
  Administered 2017-05-29 – 2017-06-20 (×22): 120 mg via ORAL
  Filled 2017-05-29 (×28): qty 1

## 2017-05-29 MED ORDER — ACETAMINOPHEN 325 MG PO TABS
650.0000 mg | ORAL_TABLET | Freq: Four times a day (QID) | ORAL | Status: DC | PRN
  Administered 2017-06-09 – 2017-06-17 (×3): 650 mg via ORAL
  Filled 2017-05-29 (×3): qty 2

## 2017-05-29 MED ORDER — ALUM & MAG HYDROXIDE-SIMETH 200-200-20 MG/5ML PO SUSP: 30.0000 mL | ORAL | Status: DC | PRN

## 2017-05-29 MED ORDER — DIVALPROEX SODIUM 500 MG PO DR TAB
500.0000 mg | DELAYED_RELEASE_TABLET | Freq: Two times a day (BID) | ORAL | Status: DC
  Administered 2017-05-29 – 2017-06-20 (×42): 500 mg via ORAL
  Filled 2017-05-29 (×52): qty 1

## 2017-05-29 MED ORDER — BENZTROPINE MESYLATE 0.5 MG PO TABS
0.5000 mg | ORAL_TABLET | Freq: Two times a day (BID) | ORAL | Status: DC
  Administered 2017-05-29 – 2017-06-09 (×20): 0.5 mg via ORAL
  Filled 2017-05-29 (×28): qty 1

## 2017-05-29 MED ORDER — ZOLPIDEM TARTRATE 5 MG PO TABS
5.0000 mg | ORAL_TABLET | Freq: Every evening | ORAL | Status: DC | PRN
  Administered 2017-05-29: 5 mg via ORAL
  Filled 2017-05-29: qty 1

## 2017-05-29 MED ORDER — ONDANSETRON HCL 4 MG PO TABS: 4.0000 mg | ORAL_TABLET | Freq: Three times a day (TID) | ORAL | Status: DC | PRN

## 2017-05-29 NOTE — ED Notes (Signed)
Notified pt can go to [facility] at 1pm.  Accepting info in EMR and Dr. Cathleen Fears aware.

## 2017-05-29 NOTE — ED Notes (Signed)
T/c to [facility], spoke with [facility] who states it is his understanding pt will not be obtaining placement that was available as pt had a fall today and after fall there were no neuro cks/vs/CT

## 2017-05-29 NOTE — ED Notes (Addendum)
T/c with Herbert Spires, [facility] with [place], advised pt was evaluated by Dr. Tomi Bamberger after fall, v/s were preformed and documented, no neuro cks were ordered, B Met was ordered and completed-WNL, Tori states that after pt fall she consulted with NP, Corene Cornea with [facility] and he recommends the pt be observed overnight and have nero checks before placement with [facility], she further states this pt will require a private room, [facility] currently has a private room that she will hold until tomorrow morning so [place] staff can observe and perform neuro cks, also states shift change is at 0730 and Ria Comment will be working tomorrow, Herbert Spires states Ria Comment is familiar with this pt's placement status and will call as soon as pt can be transported, t/c to [place], [place] AC to update on status of pt placement

## 2017-05-29 NOTE — ED Provider Notes (Signed)
1 PM patient is alert ambulatory in no distress.  Not lightheaded on standing. Table for transfer to [place] Results for orders placed or performed during the hospital encounter of 05/23/17  Comprehensive metabolic panel  Result Value Ref Range   Sodium 140 135 - 145 mmol/L   Potassium 3.4 (L) 3.5 - 5.1 mmol/L   Chloride 103 101 - 111 mmol/L   CO2 29 22 - 32 mmol/L   Glucose, Bld 89 65 - 99 mg/dL   BUN 13 6 - 20 mg/dL   Creatinine, Ser 0.80 0.44 - 1.00 mg/dL   Calcium 9.1 8.9 - 10.3 mg/dL   Total Protein 7.2 6.5 - 8.1 g/dL   Albumin 3.8 3.5 - 5.0 g/dL   AST 29 15 - 41 U/L   ALT 17 14 - 54 U/L   Alkaline Phosphatase 53 38 - 126 U/L   Total Bilirubin 0.8 0.3 - 1.2 mg/dL   GFR calc non Af Amer >60 >60 mL/min   GFR calc Af Amer >60 >60 mL/min   Anion gap 8 5 - 15  CBC WITH DIFFERENTIAL  Result Value Ref Range   WBC 3.5 (L) 4.0 - 10.5 K/uL   RBC 4.38 3.87 - 5.11 MIL/uL   Hemoglobin 13.0 12.0 - 15.0 g/dL   HCT 41.1 36.0 - 46.0 %   MCV 93.8 78.0 - 100.0 fL   MCH 29.7 26.0 - 34.0 pg   MCHC 31.6 30.0 - 36.0 g/dL   RDW 12.7 11.5 - 15.5 %   Platelets 198 150 - 400 K/uL   Neutrophils Relative % 58 %   Neutro Abs 2.0 1.7 - 7.7 K/uL   Lymphocytes Relative 30 %   Lymphs Abs 1.0 0.7 - 4.0 K/uL   Monocytes Relative 10 %   Monocytes Absolute 0.3 0.1 - 1.0 K/uL   Eosinophils Relative 2 %   Eosinophils Absolute 0.1 0.0 - 0.7 K/uL   Basophils Relative 0 %   Basophils Absolute 0.0 0.0 - 0.1 K/uL  Urinalysis, Complete w Microscopic  Result Value Ref Range   Color, Urine YELLOW YELLOW   APPearance CLEAR CLEAR   Specific Gravity, Urine 1.026 1.005 - 1.030   pH 5.0 5.0 - 8.0   Glucose, UA NEGATIVE NEGATIVE mg/dL   Hgb urine dipstick NEGATIVE NEGATIVE   Bilirubin Urine NEGATIVE NEGATIVE   Ketones, ur 20 (A) NEGATIVE mg/dL   Protein, ur 30 (A) NEGATIVE mg/dL   Nitrite NEGATIVE NEGATIVE   Leukocytes, UA NEGATIVE NEGATIVE   RBC / HPF 0-5 0 - 5 RBC/hpf   WBC, UA 0-5 0 - 5 WBC/hpf   Bacteria,  UA NONE SEEN NONE SEEN   Squamous Epithelial / LPF 0-5 (A) NONE SEEN   Mucus PRESENT   Ammonia  Result Value Ref Range   Ammonia 16 9 - 35 umol/L  Lactic acid, plasma  Result Value Ref Range   Lactic Acid, Venous 0.9 0.5 - 1.9 mmol/L  Blood gas, venous  Result Value Ref Range   FIO2 21.00    pH, Ven 7.378 7.250 - 7.430   pCO2, Ven 52.5 44.0 - 60.0 mmHg   pO2, Ven 54.1 (H) 32.0 - 45.0 mmHg   Bicarbonate 28.0 20.0 - 28.0 mmol/L   Acid-Base Excess 5.3 (H) 0.0 - 2.0 mmol/L   O2 Saturation 85.5 %   Collection site VEIN    Drawn by 2248    Sample type VEIN   Ethanol  Result Value Ref Range   Alcohol, Ethyl (B) <  5 <10 mg/dL  Urine rapid drug screen (hosp performed)  Result Value Ref Range   Opiates NONE DETECTED NONE DETECTED   Cocaine NONE DETECTED NONE DETECTED   Benzodiazepines POSITIVE (A) NONE DETECTED   Amphetamines NONE DETECTED NONE DETECTED   Tetrahydrocannabinol NONE DETECTED NONE DETECTED   Barbiturates NONE DETECTED NONE DETECTED  Valproic acid level  Result Value Ref Range   Valproic Acid Lvl 85 50.0 - 100.0 ug/mL  hCG, serum, qualitative  Result Value Ref Range   Preg, Serum WEAK POSITIVE (A) NEGATIVE  hCG, quantitative, pregnancy  Result Value Ref Range   hCG, Beta Chain, Quant, S 4 <5 mIU/mL  Basic metabolic panel  Result Value Ref Range   Sodium 140 135 - 145 mmol/L   Potassium 4.4 3.5 - 5.1 mmol/L   Chloride 105 101 - 111 mmol/L   CO2 27 22 - 32 mmol/L   Glucose, Bld 97 65 - 99 mg/dL   BUN 19 6 - 20 mg/dL   Creatinine, Ser 0.65 0.44 - 1.00 mg/dL   Calcium 9.3 8.9 - 10.3 mg/dL   GFR calc non Af Amer >60 >60 mL/min   GFR calc Af Amer >60 >60 mL/min   Anion gap 8 5 - 15  CBG monitoring, ED  Result Value Ref Range   Glucose-Capillary 87 65 - 99 mg/dL  I-Stat beta hCG blood, ED  Result Value Ref Range   I-stat hCG, quantitative <5.0 <5 mIU/mL   Comment 3           Ct Head Wo Contrast  Result Date: 05/23/2017 CLINICAL DATA:  Altered level of  consciousness EXAM: CT HEAD WITHOUT CONTRAST TECHNIQUE: Contiguous axial images were obtained from the base of the skull through the vertex without intravenous contrast. COMPARISON:  03/27/2017 FINDINGS: Brain: No evidence of acute infarction, hemorrhage, hydrocephalus, extra-axial collection or mass lesion/mass effect. Incidental mega cisterna magna. Stable brain volume. Vascular: No hyperdense vessel or unexpected calcification. Skull: 2 small incidental osteomas along the superficial left calvarium. 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 No acute finding. Sinuses/Orbits: Negative IMPRESSION: No acute finding or change from September 2018 brain MRI. Electronically Signed   By: Monte Fantasia M.D.   On: 05/23/2017 16:21     Orlie Dakin, MD 05/29/17 1310

## 2017-05-29 NOTE — Tx Team (Signed)
Initial Treatment Plan 05/29/2017 6:28 PM [name] RTM:[id number]    PATIENT STRESSORS: Financial difficulties Medication change or noncompliance Other: ongoing psychosis   PATIENT STRENGTHS: General fund of knowledge Motivation for treatment/growth Physical Health   PATIENT IDENTIFIED PROBLEMS: Psychosis  "Hear"  "Speak"                 DISCHARGE CRITERIA:  Improved stabilization in mood, thinking, and/or behavior Verbal commitment to aftercare and medication compliance  PRELIMINARY DISCHARGE PLAN: Outpatient therapy Medication management  PATIENT/FAMILY INVOLVEMENT: This treatment plan has been presented to and reviewed with the patient, [name] Ball.  The patient and family have been given the opportunity to ask questions and make suggestions.  Windell Moment, RN 05/29/2017, 6:28 PM

## 2017-05-29 NOTE — ED Notes (Signed)
Pt becoming agitated.  Pacing in hallway asking when she is going to [place].  Medicated as ordered, but pt states she is not taking "all of those pills."  Got pt to take all but her Depakote.  Pt states those are the big ones and she doesn't like them.  Pt informed she was leaving at 1pm and this appeased her some.

## 2017-05-29 NOTE — BHH Counselor (Signed)
Pt was reassessed this AM- pt still tangential and labile but is pleasant. 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 Pt still recommended for inpatient hospitalization.   Pt accepted to [facility] room 505-1 can arrive at 1p. Accepting physician Zerita Boers NP, Attending Dr. Nancy Fetter.   RN to RN report [place], Deon Pilling, [facility] Lead Triage Specialist  [facility]  Therapeutic Triage Services Phone: [phone] Fax: [phone]

## 2017-05-29 NOTE — Progress Notes (Signed)
Patient did not attend group.

## 2017-05-30 DIAGNOSIS — F0391 Unspecified dementia with behavioral disturbance: Secondary | ICD-10-CM

## 2017-05-30 DIAGNOSIS — F17210 Nicotine dependence, cigarettes, uncomplicated: Secondary | ICD-10-CM

## 2017-05-30 DIAGNOSIS — F209 Schizophrenia, unspecified: Principal | ICD-10-CM

## 2017-05-30 MED ORDER — ARIPIPRAZOLE 10 MG PO TABS
20.0000 mg | ORAL_TABLET | Freq: Every day | ORAL | Status: DC
  Administered 2017-05-30 – 2017-06-06 (×8): 20 mg via ORAL
  Filled 2017-05-30 (×12): qty 2

## 2017-05-30 MED ORDER — HALOPERIDOL DECANOATE 100 MG/ML IM SOLN
150.0000 mg | INTRAMUSCULAR | Status: DC
  Administered 2017-05-31: 150 mg via INTRAMUSCULAR
  Filled 2017-05-30: qty 1.5

## 2017-05-30 MED ORDER — HALOPERIDOL 5 MG PO TABS
5.0000 mg | ORAL_TABLET | Freq: Three times a day (TID) | ORAL | Status: DC
  Administered 2017-05-30 – 2017-06-20 (×59): 5 mg via ORAL
  Filled 2017-05-30 (×70): qty 1

## 2017-05-30 MED ORDER — DOXEPIN HCL 25 MG PO CAPS
25.0000 mg | ORAL_CAPSULE | Freq: Once | ORAL | Status: AC
  Administered 2017-05-30: 25 mg via ORAL
  Filled 2017-05-30 (×2): qty 1

## 2017-05-30 NOTE — H&P (Signed)
Psychiatric Admission Assessment Adult  Patient Identification: [name] Ball MRN:  [id number] Date of Evaluation:  05/30/2017 Chief Complaint:  SCHIZOPHRENIA SPECTRUM AND OTHER PSYCHOTIC DISORDER Principal Diagnosis: Schizophrenia ([place]) Diagnosis:   Patient Active Problem List   Diagnosis Date Noted  . Schizophrenia ([place]) [F20.9] 05/29/2017  . Bipolar disorder, current episode manic, severe with psychotic features ([place]) [F31.2] 04/03/2017  . Hepatitis C virus infection without hepatic coma [B19.20]   . Bipolar disorder ([place]) [F31.9]   . GERD (gastroesophageal reflux disease) [K21.9]    History of Present Illness:  [name] Ball is a 51 y/o F with history of schizophrenia vs bipolar with psychotic features who was admitted initially to ED with worsening symptoms of psychosis including inability to take care of herself, speaking about things that were not occurring, rambling, and wandering off from her group home. Pt has a guardian and an ACT team. She was restarted on her medications and transferred to [facility] for additional evaluation. Pt has recent relevant history of discharge from [facility] on 04/26/17 to her group home. Since that time her medications were continued with the addition of clonazepam to her discharge regimen.  Upon evaluation, pt reports she is doing well overall and she would like to discharge. She reports she is in a "good" mood. She is sleeping well and her appetite is good. She denies SI/HI/AH/VH. She reports she is tolerating her medications without difficulty. She has disorganization of her thoughts and delusions which appear to be at baseline. Pt reports that she has multiple children which are currently at this building which she describes as "a school." Discussed with patient that she is in a hospital. She believes her current city is "[place]," the current month is "October" and the current year is "18."   Discussed with patient about treatment options. We discussed  that we would resume her previous medications and check to see if she is due for an injection. Pt was in agreement with the above plan and she had no further questions, comments, or concerns.  Associated Signs/Symptoms: Depression Symptoms:  pt denies (Hypo) Manic Symptoms:  Delusions, Distractibility, Grandiosity, Anxiety Symptoms:  denies Psychotic Symptoms:  Delusions, Ideas of Reference, PTSD Symptoms: NA Total Time spent with patient: 1 hour  Past Psychiatric History:  - dx of bipolar with psychotic features vs schizophrenia - multiple inpatient stays with last admission on 04/04/17-04/26/17 at [facility] - Outpatient ACT team - denies hx of SA  Is the patient at risk to self? Yes.    Has the patient been a risk to self in the past 6 months? Yes.    Has the patient been a risk to self within the distant past? Yes.    Is the patient a risk to others? Yes.    Has the patient been a risk to others in the past 6 months? Yes.    Has the patient been a risk to others within the distant past? Yes.     Prior Inpatient Therapy:   Prior Outpatient Therapy:    Alcohol Screening: 1. How often do you have a drink containing alcohol?: Never 2. How many drinks containing alcohol do you have on a typical day when you are drinking?: 1 or 2 3. How often do you have six or more drinks on one occasion?: Never AUDIT-C Score: 0 9. Have you or someone else been injured as a result of your drinking?: No 10. Has a relative or friend or a doctor or another health worker been concerned about your  drinking or suggested you cut down?: No Alcohol Use Disorder Identification Test Final Score (AUDIT): 0 Intervention/Follow-up: AUDIT Score <7 follow-up not indicated Substance Abuse History in the last 12 months:  No. Consequences of Substance Abuse: NA Previous Psychotropic Medications: Yes  Psychological Evaluations: Yes  Past Medical History:  Past Medical History:  Diagnosis Date  . Bipolar disorder  ([place])   . GERD (gastroesophageal reflux disease)   . Hepatitis C virus infection without hepatic coma     Past Surgical History:  Procedure Laterality Date  . abdominal cyst removed    . CESAREAN SECTION    . COLON SURGERY     Family History: History reviewed. No pertinent family history. Family Psychiatric  History: History reviewed. No pertinent family history.   Tobacco Screening: Have you used any form of tobacco in the last 30 days? (Cigarettes, Smokeless Tobacco, Cigars, and/or Pipes): Yes Tobacco use, Select all that apply: 4 or less cigarettes per day Are you interested in Tobacco Cessation Medications?: No, patient refused Counseled patient on smoking cessation including recognizing danger situations, developing coping skills and basic information about quitting provided: Refused/Declined practical counseling Social History:  Social History   Substance and Sexual Activity  Alcohol Use No     Social History   Substance and Sexual Activity  Drug Use No    Additional Social History:                           Allergies:   Allergies  Allergen Reactions  . Trazodone And Nefazodone     Pt stated doesn't make her feel good   Lab Results: No results found for this or any previous visit (from the past 48 hour(s)).  Blood Alcohol level:  Lab Results  Component Value Date   [facility] <5 05/23/2017   ETH <5 69/48/5462    Metabolic Disorder Labs:  Lab Results  Component Value Date   HGBA1C 4.6 (L) 04/05/2017   MPG 85.32 04/05/2017   Lab Results  Component Value Date   PROLACTIN 50.6 (H) 04/05/2017   Lab Results  Component Value Date   CHOL 130 04/05/2017   TRIG 51 04/05/2017   HDL 57 04/05/2017   CHOLHDL 2.3 04/05/2017   VLDL 10 04/05/2017   LDLCALC 63 04/05/2017   LDLCALC 89 05/03/2012    Current Medications: Current Facility-Administered Medications  Medication Dose Route Frequency Provider Last Rate Last Dose  . acetaminophen (TYLENOL) tablet  650 mg  650 mg Oral Q6H PRN Niel Hummer, NP      . alum & mag hydroxide-simeth (MAALOX/MYLANTA) 200-200-20 MG/5ML suspension 30 mL  30 mL Oral Q4H PRN Elmarie Shiley A, NP      . ARIPiprazole (ABILIFY) tablet 20 mg  20 mg Oral Daily Pennelope Bracken, MD      . benztropine (COGENTIN) tablet 0.5 mg  0.5 mg Oral BID Elmarie Shiley A, NP   0.5 mg at 05/30/17 1704  . diltiazem (CARDIZEM CD) 24 hr capsule 120 mg  120 mg Oral Daily Elmarie Shiley A, NP   120 mg at 05/30/17 0725  . divalproex (DEPAKOTE) DR tablet 500 mg  500 mg Oral BID Elmarie Shiley A, NP   500 mg at 05/30/17 1704  . haloperidol (HALDOL) tablet 5 mg  5 mg Oral TID Pennelope Bracken, MD   5 mg at 05/30/17 1704  . [START ON 05/31/2017] haloperidol decanoate (HALDOL DECANOATE) 100 MG/ML injection 150 mg  150  mg Intramuscular Q30 days Maris Berger T, MD      . LORazepam (ATIVAN) tablet 1 mg  1 mg Oral Q6H PRN Niel Hummer, NP   1 mg at 05/29/17 2110  . magnesium hydroxide (MILK OF MAGNESIA) suspension 30 mL  30 mL Oral Daily PRN Elmarie Shiley A, NP      . ondansetron [facility]) tablet 4 mg  4 mg Oral Q8H PRN Elmarie Shiley A, NP      . ziprasidone (GEODON) injection 10 mg  10 mg Intramuscular Q12H PRN Elmarie Shiley A, NP      . zolpidem (AMBIEN) tablet 5 mg  5 mg Oral QHS PRN Niel Hummer, NP   5 mg at 05/29/17 2110   PTA Medications: Medications Prior to Admission  Medication Sig Dispense Refill Last Dose  . ARIPiprazole (ABILIFY) 20 MG tablet Take 1 tablet (20 mg total) by mouth daily. For mood control (Patient not taking: Reported on 05/23/2017) 30 tablet 0 Not Taking at Unknown time  . benztropine (COGENTIN) 0.5 MG tablet Take 1 tablet (0.5 mg total) by mouth 2 (two) times daily. For prevention of drug induced tremors 60 tablet 0 05/23/2017 at Unknown time  . clonazePAM (KLONOPIN) 1 MG tablet Take 0.5-1 mg by mouth 3 (three) times daily. Take one-half tablet by mouth twice daily and take one tablet at bedtime    05/23/2017 at Unknown time  . diltiazem (DILACOR XR) 120 MG 24 hr capsule Take 1 capsule (120 mg total) by mouth daily. For high blood pressure 10 capsule 0 05/23/2017 at Unknown time  . divalproex (DEPAKOTE) 500 MG DR tablet Take 2 tablets (1,000 mg total) by mouth at bedtime. For mood stabilization 60 tablet 0 05/22/2017 at 2000  . haloperidol (HALDOL) 5 MG tablet Take 1 tablet (5 mg total) by mouth 2 (two) times daily. For mood control (Patient taking differently: Take 5 mg by mouth 3 (three) times daily. For mood control) 30 tablet 0 05/23/2017 at 800a  . haloperidol decanoate (HALDOL DECANOATE) 100 MG/ML injection Inject 1.5 mLs (150 mg total) into the muscle every 30 (thirty) days. (Due on 05-06-17): For mood control 1.5 mL 0 05/07/2017 at Unknown time  . ibuprofen (ADVIL,MOTRIN) 200 MG tablet Take 1-2 tablets (200-400 mg total) by mouth every 4 (four) hours as needed for moderate pain. 1 tablet 0 unknown  . LORazepam (ATIVAN) 1 MG tablet Take 1 mg by mouth every 6 (six) hours as needed for anxiety.   unknown  . nicotine (NICODERM CQ - DOSED IN MG/24 HR) 7 mg/24hr patch Place 1 patch (7 mg total) onto the skin daily. (May purchase from over the counter): For smoking cessation 28 patch 0 unknown  . pantoprazole (PROTONIX) 20 MG tablet Take 1 tablet (20 mg total) by mouth daily. For acid reflux   05/23/2017 at Unknown time  . predniSONE (DELTASONE) 10 MG tablet Take 2 tablets (20 mg total) by mouth daily. 10 tablet 0   . QUEtiapine (SEROQUEL) 25 MG tablet Take 25 mg by mouth 3 (three) times daily.   05/23/2017 at 800a  . zolpidem (AMBIEN) 5 MG tablet Take 1 tablet (5 mg total) by mouth at bedtime as needed for sleep. 7 tablet 0 unknown    Musculoskeletal: Strength & Muscle Tone: within normal limits Gait & Station: normal Patient leans: N/A  Psychiatric Specialty Exam: Physical Exam  Nursing note and vitals reviewed.   Review of Systems  Constitutional: Negative for chills and fever.   Respiratory:  Negative for shortness of breath.   Cardiovascular: Negative for chest pain.  Gastrointestinal: Negative for abdominal pain, heartburn, nausea and vomiting.  Psychiatric/Behavioral: Negative for depression, hallucinations and suicidal ideas. The patient is not nervous/anxious.     Blood pressure (!) 120/101, pulse (!) 116, temperature 98.4 F (36.9 C), resp. rate 16, height 5\' 3"  (1.6 m), weight 73.9 kg (163 lb), SpO2 100 %.Body mass index is 28.87 kg/m.  General Appearance: Disheveled  Eye Contact:  Good  Speech:  Clear and Coherent and Normal Rate  Volume:  Normal  Mood:  Anxious  Affect:  Appropriate, Congruent and Depressed  Thought Process:  Disorganized and Goal Directed  Orientation:  Full (Time, Place, and Person)  Thought Content:  Delusions and Ideas of Reference:   Delusions  Suicidal Thoughts:  No  Homicidal Thoughts:  No  Memory:  Immediate;   Fair Recent;   Fair Remote;   Fair  Judgement:  Impaired  Insight:  Lacking  Psychomotor Activity:  Normal  Concentration:  Concentration: Fair  Recall:  [employer] of Knowledge:  Poor  Language:  Fair  Akathisia:  No  Handed:    AIMS (if indicated):     Assets:  Housing Resilience Social Support  ADL's:  Intact  Cognition:  WNL  Sleep:  Number of Hours: 2.25    Treatment Plan Summary: Daily contact with patient to assess and evaluate symptoms and progress in treatment and Medication management  Observation Level/Precautions:  15 minute checks  Laboratory:  CBC Chemistry Profile HCG  Psychotherapy:  Encourage participation in groups and therapeutic milieu  Medications:  See MAR, restarting haldol oral and will schedule haldol decanoate injection if due, restart abilify  Consultations:    Discharge Concerns:    Estimated LOS: 5-7 days  Other:     Physician Treatment Plan for Primary Diagnosis: Schizophrenia ([place]) Long Term Goal(s): Improvement in symptoms so as ready for discharge  Short Term  Goals: Ability to maintain clinical measurements within normal limits will improve  Physician Treatment Plan for Secondary Diagnosis: Principal Problem:   Schizophrenia ([place])  Long Term Goal(s): Improvement in symptoms so as ready for discharge  Short Term Goals: Ability to demonstrate self-control will improve and Ability to identify and develop effective coping behaviors will improve  I certify that inpatient services furnished can reasonably be expected to improve the patient's condition.    Pennelope Bracken, MD 11/29/20185:15 PM

## 2017-05-30 NOTE — Progress Notes (Signed)
  Patient denies SI, HI and AVH.  Patient often speaks about things that are not congruent with what is going on .  Patient denies SI, HI and AVH.   Assess patient for safety, offer medications as prescribed, engage patient in 1:1 staff talks.  Patient able to contract for safety.
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
## 2017-05-30 NOTE — [facility] Suicide Risk Assessment (Signed)
[place] INPATIENT:  Family/Significant Other Suicide Prevention Education  Suicide Prevention Education:  Education Completed; [name] Ball, [phone] (Legal Guardian) has been identified by the patient as the family member/significant other with whom the patient will be residing, and identified as the person(s) who will aid the patient in the event of a mental health crisis (suicidal ideations/suicide attempt).  With written consent from the patient, the family member/significant other has been provided the following suicide prevention education, prior to the and/or following the discharge of the patient.  The suicide prevention education provided includes the following:  Suicide risk factors  Suicide prevention and interventions  National Suicide Hotline telephone number  [facility] assessment telephone number  [facility] Emergency Assistance [place] and/or Residential Mobile Crisis Unit telephone number  Request made of family/significant other to:  Remove weapons (e.g., guns, rifles, knives), all items previously/currently identified as safety concern.    Remove drugs/medications (over-the-counter, prescriptions, illicit drugs), all items previously/currently identified as a safety concern.  The family member/significant other verbalizes understanding of the suicide prevention education information provided.  The family member/significant other agrees to remove the items of safety concern listed above.  [name] Ball 05/30/2017, 1:32 PM

## 2017-05-30 NOTE — Progress Notes (Signed)
Recreation Therapy Notes  Date: 05/30/17 Time: 1000 Location: 500 Hall Dayroom  Group Topic: Wellness  Goal Area(s) Addresses:  Patient will define components of whole wellness. Patient will verbalize benefit of whole wellness.  Intervention:  Psychiatrist  Activity: [place]arks in [employer].  Each patient was given a rubber disc, with one extra disc for the group.  Patients were to use the discs to step on to get from one end of the hall to the other and back.  If anyone stepped off their disc, the group would have to start over from the beginning.   Education: Wellness, Dentist.   Education Outcome: Acknowledges education/In group clarification offered/Needs additional education.   Clinical Observations/Feedback: Pt did not attend group.   Victorino Sparrow, LRT/CTRS         Victorino Sparrow A 05/30/2017 12:38 PM

## 2017-05-30 NOTE — Tx Team (Signed)
Interdisciplinary Treatment and Diagnostic Plan Update  05/30/2017 Time of Session: 1:53 PM  [name] Ball MRN: [id number]  Principal Diagnosis: <principal problem not specified>  Secondary Diagnoses: Active Problems:   Schizophrenia ([place])   Current Medications:  Current Facility-Administered Medications  Medication Dose Route Frequency Provider Last Rate Last Dose  . acetaminophen (TYLENOL) tablet 650 mg  650 mg Oral Q6H PRN Niel Hummer, NP      . alum & mag hydroxide-simeth (MAALOX/MYLANTA) 200-200-20 MG/5ML suspension 30 mL  30 mL Oral Q4H PRN Elmarie Shiley A, NP      . benztropine (COGENTIN) tablet 0.5 mg  0.5 mg Oral BID Elmarie Shiley A, NP   0.5 mg at 05/30/17 5400  . diltiazem (CARDIZEM CD) 24 hr capsule 120 mg  120 mg Oral Daily Elmarie Shiley A, NP   120 mg at 05/30/17 0725  . divalproex (DEPAKOTE) DR tablet 500 mg  500 mg Oral BID Elmarie Shiley A, NP   500 mg at 05/30/17 0725  . haloperidol (HALDOL) tablet 5 mg  5 mg Oral TID Pennelope Bracken, MD      . LORazepam (ATIVAN) tablet 1 mg  1 mg Oral Q6H PRN Niel Hummer, NP   1 mg at 05/29/17 2110  . magnesium hydroxide (MILK OF MAGNESIA) suspension 30 mL  30 mL Oral Daily PRN Elmarie Shiley A, NP      . ondansetron [facility]) tablet 4 mg  4 mg Oral Q8H PRN Elmarie Shiley A, NP      . ziprasidone (GEODON) injection 10 mg  10 mg Intramuscular Q12H PRN Elmarie Shiley A, NP      . zolpidem (AMBIEN) tablet 5 mg  5 mg Oral QHS PRN Niel Hummer, NP   5 mg at 05/29/17 2110    PTA Medications: Medications Prior to Admission  Medication Sig Dispense Refill Last Dose  . ARIPiprazole (ABILIFY) 20 MG tablet Take 1 tablet (20 mg total) by mouth daily. For mood control (Patient not taking: Reported on 05/23/2017) 30 tablet 0 Not Taking at Unknown time  . benztropine (COGENTIN) 0.5 MG tablet Take 1 tablet (0.5 mg total) by mouth 2 (two) times daily. For prevention of drug induced tremors 60 tablet 0 05/23/2017 at Unknown time  . clonazePAM  (KLONOPIN) 1 MG tablet Take 0.5-1 mg by mouth 3 (three) times daily. Take one-half tablet by mouth twice daily and take one tablet at bedtime   05/23/2017 at Unknown time  . diltiazem (DILACOR XR) 120 MG 24 hr capsule Take 1 capsule (120 mg total) by mouth daily. For high blood pressure 10 capsule 0 05/23/2017 at Unknown time  . divalproex (DEPAKOTE) 500 MG DR tablet Take 2 tablets (1,000 mg total) by mouth at bedtime. For mood stabilization 60 tablet 0 05/22/2017 at 2000  . haloperidol (HALDOL) 5 MG tablet Take 1 tablet (5 mg total) by mouth 2 (two) times daily. For mood control (Patient taking differently: Take 5 mg by mouth 3 (three) times daily. For mood control) 30 tablet 0 05/23/2017 at 800a  . haloperidol decanoate (HALDOL DECANOATE) 100 MG/ML injection Inject 1.5 mLs (150 mg total) into the muscle every 30 (thirty) days. (Due on 05-06-17): For mood control 1.5 mL 0 05/07/2017 at Unknown time  . ibuprofen (ADVIL,MOTRIN) 200 MG tablet Take 1-2 tablets (200-400 mg total) by mouth every 4 (four) hours as needed for moderate pain. 1 tablet 0 unknown  . LORazepam (ATIVAN) 1 MG tablet Take 1 mg by mouth every 6 (  six) hours as needed for anxiety.   unknown  . nicotine (NICODERM CQ - DOSED IN MG/24 HR) 7 mg/24hr patch Place 1 patch (7 mg total) onto the skin daily. (May purchase from over the counter): For smoking cessation 28 patch 0 unknown  . pantoprazole (PROTONIX) 20 MG tablet Take 1 tablet (20 mg total) by mouth daily. For acid reflux   05/23/2017 at Unknown time  . predniSONE (DELTASONE) 10 MG tablet Take 2 tablets (20 mg total) by mouth daily. 10 tablet 0   . QUEtiapine (SEROQUEL) 25 MG tablet Take 25 mg by mouth 3 (three) times daily.   05/23/2017 at 800a  . zolpidem (AMBIEN) 5 MG tablet Take 1 tablet (5 mg total) by mouth at bedtime as needed for sleep. 7 tablet 0 unknown    Patient Stressors: Financial difficulties Medication change or noncompliance Other: ongoing psychosis  Patient  Strengths: General fund of knowledge Motivation for treatment/growth Physical Health  Treatment Modalities: Medication Management, Group therapy, Case management,  1 to 1 session with clinician, Psychoeducation, Recreational therapy.   Physician Treatment Plan for Primary Diagnosis: <principal problem not specified> Long Term Goal(s): Improvement in symptoms so as ready for discharge  Short Term Goals:    Medication Management: Evaluate patient's response, side effects, and tolerance of medication regimen.  Therapeutic Interventions: 1 to 1 sessions, Unit Group sessions and Medication administration.  Evaluation of Outcomes: Progressing  Physician Treatment Plan for Secondary Diagnosis: Active Problems:   Schizophrenia ([place])   Long Term Goal(s): Improvement in symptoms so as ready for discharge  Short Term Goals:    Medication Management: Evaluate patient's response, side effects, and tolerance of medication regimen.  Therapeutic Interventions: 1 to 1 sessions, Unit Group sessions and Medication administration.  Evaluation of Outcomes: Progressing   RN Treatment Plan for Primary Diagnosis: <principal problem not specified> Long Term Goal(s): Knowledge of disease and therapeutic regimen to maintain health will improve  Short Term Goals: Ability to identify and develop effective coping behaviors will improve and Compliance with prescribed medications will improve  Medication Management: RN will administer medications as ordered by provider, will assess and evaluate patient's response and provide education to patient for prescribed medication. RN will report any adverse and/or side effects to prescribing provider.  Therapeutic Interventions: 1 on 1 counseling sessions, Psychoeducation, Medication administration, Evaluate responses to treatment, Monitor vital signs and CBGs as ordered, Perform/monitor CIWA, COWS, AIMS and Fall Risk screenings as ordered, Perform wound care  treatments as ordered.  Evaluation of Outcomes: Progressing   LCSW Treatment Plan for Primary Diagnosis: <principal problem not specified> Long Term Goal(s): Safe transition to appropriate next level of care at discharge, Engage patient in therapeutic group addressing interpersonal concerns.  Short Term Goals: Engage patient in aftercare planning with referrals and resources  Therapeutic Interventions: Assess for all discharge needs, 1 to 1 time with Social worker, Explore available resources and support systems, Assess for adequacy in community support network, Educate family and significant other(s) on suicide prevention, Complete Psychosocial Assessment, Interpersonal group therapy.  Evaluation of Outcomes: Not Met  Unsure of where pt will go at d/c.  CSW to talk to Mr [facility] about taking pt back   Progress in Treatment: Attending groups: Yes Participating in groups: Yes Taking medication as prescribed: Yes Toleration medication: Yes, no side effects reported at this time Family/Significant other contact made: Yes  Guardian Patient understands diagnosis: No  Limited insight Discussing patient identified problems/goals with staff: Yes Medical problems stabilized or resolved: Yes  Denies suicidal/homicidal ideation: Yes Issues/concerns per patient self-inventory: None Other: N/A  New problem(s) identified: None identified at this time.   New Short Term/Long Term Goal(s): None identified at this time.   Discharge Plan or Barriers:   Reason for Continuation of Hospitalization: Disorganization Delusions  Medication stabilization   Estimated Length of Stay: 12/4  Attendees: Patient: 05/30/2017  1:53 PM  Physician: Maris Berger, MD 05/30/2017  1:53 PM  Nursing: Sena Hitch, RN 05/30/2017  1:53 PM  RN Care Manager: Lars Pinks, RN 05/30/2017  1:53 PM  Social Worker: Ripley Fraise 05/30/2017  1:53 PM  Recreational Therapist: Winfield Cunas 05/30/2017  1:53  PM  Other: Norberto Sorenson 05/30/2017  1:53 PM  Other:  05/30/2017  1:53 PM    Scribe for Treatment Team:  Roque Lias LCSW 05/30/2017 1:53 PM

## 2017-05-30 NOTE — Progress Notes (Signed)
Pt is new to the unit this afternoon.  She stayed in her room most of the evening, but did come out and get a snack.  Pt's thought process is disorganized and tangential.  She was not able to carry on a meaningful conversation, but she did say that she was not SI or HI.  She denies AVH, but appears to be responding to internal stimuli.  She was redirectable and pleasant.  She agreed to take medications tonight to help her relax and sleep.  Support and encouragement offered.  Discharge plans are in process.  Safety maintained with q15 minute checks.

## 2017-05-30 NOTE — Progress Notes (Signed)
Adult Psychoeducational Group Note  Date:  05/30/2017 Time:  11:28 PM  Group Topic/Focus:  Wrap-Up Group:   The focus of this group is to help patients review their daily goal of treatment and discuss progress on daily workbooks.  Participation Level:  Active  Participation Quality:  Appropriate  Affect:  Appropriate  Cognitive:  Appropriate  Insight: Appropriate  Engagement in Group:  Engaged  Modes of Intervention:  Discussion  Additional Comments:  Patient attended group and said that her day was a 6.  Her goal for today was to go home and it was not accomplished.  [name] Ball W [name] 00/51/1021, 11:28 PM

## 2017-05-30 NOTE — BHH Suicide Risk Assessment (Signed)
[facility] Admission Suicide Risk Assessment   Nursing information obtained from:  Patient Demographic factors:  NA Current Mental Status:  NA Loss Factors:  NA Historical Factors:  NA Risk Reduction Factors:  NA  Total Time spent with patient: 1 hour Principal Problem: Schizophrenia ([place]) Diagnosis:   Patient Active Problem List   Diagnosis Date Noted  . Schizophrenia ([place]) [F20.9] 05/29/2017  . Bipolar disorder, current episode manic, severe with psychotic features ([place]) [F31.2] 04/03/2017  . Hepatitis C virus infection without hepatic coma [B19.20]   . Bipolar disorder ([place]) [F31.9]   . GERD (gastroesophageal reflux disease) [K21.9]    Subjective Data:  See H&P for full HPI [name] Ball is a 51 y/o F with history of schizophrenia who was admitted from her group home with worsening disorganization, wandering, and inability to meet her own basic needs. She will be restarted on her previous home medications and stabilized on the inpatient psychiatry unit.   Continued Clinical Symptoms:  Alcohol Use Disorder Identification Test Final Score (AUDIT): 0 The "Alcohol Use Disorders Identification Test", Guidelines for Use in Primary Care, Second Edition.  World Pharmacologist [facility]). Score between 0-7:  no or low risk or alcohol related problems. Score between 8-15:  moderate risk of alcohol related problems. Score between 16-19:  high risk of alcohol related problems. Score 20 or above:  warrants further diagnostic evaluation for alcohol dependence and treatment.   CLINICAL FACTORS:   Schizophrenia:   Paranoid or undifferentiated type   Musculoskeletal: Strength & Muscle Tone: within normal limits Gait & Station: normal Patient leans: N/A  Psychiatric Specialty Exam: Physical Exam  Nursing note and vitals reviewed.   Review of Systems  Constitutional: Negative for chills and fever.  Respiratory: Negative for cough.   Cardiovascular: Negative for chest pain and  palpitations.  Psychiatric/Behavioral: Negative for depression, hallucinations and suicidal ideas.    Blood pressure (!) 120/101, pulse (!) 116, temperature 98.4 F (36.9 C), resp. rate 16, height 5\' 3"  (1.6 m), weight 73.9 kg (163 lb), SpO2 100 %.Body mass index is 28.87 kg/m.  General Appearance: Disheveled  Eye Contact:  Good  Speech:  Clear and Coherent and Normal Rate  Volume:  Normal  Mood:  Anxious  Affect:  Appropriate, Congruent and Depressed  Thought Process:  Disorganized and Goal Directed  Orientation:  Full (Time, Place, and Person)  Thought Content:  Delusions and Ideas of Reference:   Delusions  Suicidal Thoughts:  No  Homicidal Thoughts:  No  Memory:  Immediate;   Fair Recent;   Fair Remote;   Fair  Judgement:  Impaired  Insight:  Lacking  Psychomotor Activity:  Normal  Concentration:  Concentration: Fair  Recall:  [employer] of Knowledge:  Poor  Language:  Fair  Akathisia:  No  Handed:    AIMS (if indicated):     Assets:  Housing Resilience Social Support  ADL's:  Intact  Cognition:  WNL  Sleep:  Number of Hours: 2.25         COGNITIVE FEATURES THAT CONTRIBUTE TO RISK:  None    SUICIDE RISK:   Minimal: No identifiable suicidal ideation.  Patients presenting with no risk factors but with morbid ruminations; may be classified as minimal risk based on the severity of the depressive symptoms  PLAN OF CARE:  - admit to inpatient level of care - Schizophrenia  - Change haldol 5mg  BID to haldol 5mg  TID  - Continue haldol decanoate 150mg  IM q30 Days (next due 05/31/17)   -  Continue abilify 20mg  qDay  - discontinue clonazepam  - continue cogentin 0.5mg  BID  - Continue ambien 5mg  qhs prn insomnia  - Continue depakote 500mg  BID  - discontinue seroquel (home medication) -encourage participation in groups and therapeutic milieu -Discharge planning will be ongoing  I certify that inpatient services furnished can reasonably be expected to improve the  patient's condition.   Pennelope Bracken, MD 05/30/2017, 5:26 PM

## 2017-05-30 NOTE — BHH Counselor (Signed)
Adult Comprehensive Assessment  Patient ID: [name] Ball, female   DOB: [birth year], 51 y.o.   MRN: [id number]  Information Source: Information source: Patient (Legal guardian, [name] Ball)  Current Stressors:  Educational / Learning stressors: N/A Employment / Job issues: Pt receives disability, is unemployed  Family Relationships: Pt has 2 sisters, mother passed away last year, no contact with father  Museum/gallery curator / Lack of resources (include bankruptcy): [place] / Lack of housing: Lives at [facility] group home  Physical health (include injuries & life threatening diseases): N/A Social relationships: Pt has limited contact with 2 sisters and 1 daughter Substance abuse: Prior history of Cocaine use before 2013  Bereavement / Loss: Mother passed away last year   Living/Environment/Situation:  Living Arrangements: Group Home How long has patient lived in current situation?: Since April 2017 What is atmosphere in current home: Chaotic  Family History:  Marital status: Single Are you sexually active?: No What is your sexual orientation?: Heterosexual  Does patient have children?: Yes How many children?: 3 How is patient's relationship with their children?: Pt only has contact with one adult daughter, has never lived with the children   Childhood History:  By whom was/is the patient raised?: Mother Description of patient's relationship with caregiver when they were a child: No information at this time Patient's description of current relationship with people who raised him/her: None, mother has passed away, has no contact with father Does patient have siblings?: Yes Number of Siblings: 2 Description of patient's current relationship with siblings: Limited contact with both sisters  Did patient suffer any verbal/emotional/physical/sexual abuse as a child?: No (No information at this time ) Did patient suffer from severe childhood neglect?: No Has  patient ever been sexually abused/assaulted/raped as an adolescent or adult?: No Was the patient ever a victim of a crime or a disaster?: No Witnessed domestic violence?: No Has patient been effected by domestic violence as an adult?: No  Education:  Highest grade of school patient has completed: No information at this time Currently a student?: No Learning disability?: No  Employment/Work Situation:   Employment situation: On disability Why is patient on disability: Mental Health Diagnosis How long has patient been on disability: Entire life Patient's job has been impacted by current illness: Yes Describe how patient's job has been impacted: Pt is unable to work due to mental health diagnosis What is the longest time patient has a held a job?: No information at this time Where was the patient employed at that time?: No information at this time  Has patient ever been in the [employer]?: No Has patient ever served in combat?: No Did You Receive Any Psychiatric Treatment/Services While in Passenger transport manager?: No Are There Guns or Other Weapons in [place]?: No Are These Weapons Safely Secured?: Yes  Financial Resources:   Financial resources: Teacher, early years/pre Does patient have a Programmer, applications or guardian?: Yes Name of representative payee or guardian: [name] Ball   Alcohol/Substance Abuse:   What has been your use of drugs/alcohol within the last 12 months?: N/A (Prior Cocaine use before 2013 ) If attempted suicide, did drugs/alcohol play a role in this?: No Alcohol/Substance Abuse Treatment Hx: Denies past history Has alcohol/substance abuse ever caused legal problems?: No  Social Support System:   Heritage manager System: Poor Describe Community Support System: Group home residents, limited contact with siblings and adult children  Type of faith/religion: No information at this time How does patient's faith help to cope with current  illness?: No information at  this time   Leisure/Recreation:   Leisure and Hobbies: No information at this time  Strengths/Needs:   What things does the patient do well?: No information at this time In what areas does patient struggle / problems for patient: No information at this time  Discharge Plan:   Does patient have access to transportation?: Yes Will patient be returning to same living situation after discharge?: Yes Currently receiving community mental health services: Yes (From Whom) If no, would patient like referral for services when discharged?: No Does patient have financial barriers related to discharge medications?: No   Summary/Recommendations:   Summary and Recommendations (to be completed by the evaluator): [name] Ball is a 51 YO AA female diagnosed with Bipolar D/O, manic, severe, with psychosis.  She was discharged from this facility last month after a 3 week stay, and [facility]  [name] Ball returns with staff concerns of increased wandering, rambling thoughts, decline in ability to take care of herself, and delusions.  She is serviced by [facility] ACT team and  has a guardian.  At D/C, the tentaive plan is to return her to the [facility] and continue with ACT team.  While here, she can benefit from crises stabilization, medication management, therapeutic milieu and coordination with providers.  [name] Ball. 05/30/2017

## 2017-05-31 MED ORDER — DOXEPIN HCL 25 MG PO CAPS
25.0000 mg | ORAL_CAPSULE | Freq: Every evening | ORAL | Status: DC | PRN
  Administered 2017-05-31 – 2017-06-04 (×4): 25 mg via ORAL
  Filled 2017-05-31 (×5): qty 1

## 2017-05-31 NOTE — Progress Notes (Signed)
Patient denies SI, HI and AVH.  Patient often speaks about things that are not congruent with what is going on .  Patient denies SI, HI and AVH.   Assess patient for safety, offer medications as prescribed, engage patient in 1:1 staff talks.  Patient able to contract for safety.

## 2017-05-31 NOTE — Progress Notes (Signed)
[facility] MD Progress Note  05/31/2017 12:14 PM [name] Ball  MRN:  [id number] Subjective:   [name] Ball is a 51 y/o F with history of schizophrenia vs bipolar with psychotic features who was admitted initially to ED with worsening symptoms of psychosis including inability to take care of herself, speaking about things that were not occurring, rambling, and wandering off from her group home. She was restarted on medications and transferred to [facility] for further stabilization. Today upon evaluation, pt is pleasant and cooperative, but she has been wandering around the unit into others' rooms. She continues to endorse delusional content that she has multiple children in this building which she reports is a "school." She says that three of her children's names are "K[place]va, Boris Lown, and [place]," and she asks multiple times if she can see them. Pt was redirected that she is at [facility] in [place]. She states that she believes she is in [place]. She was unable to name the year even when given multiple choice options and she states that the month is "October."  Discussed with patient about treatment options. We discussed that she is due to haldol decanoate injection today and we will keep all her medications the same as we attempt to arrange for her group home to evaluate her progress in the coming days. Pt was in agreement with that plan and she had no further questions, comments, or concerns.    Principal Problem: Schizophrenia ([place]) Diagnosis:   Patient Active Problem List   Diagnosis Date Noted  . Schizophrenia ([place]) [F20.9] 05/29/2017  . Bipolar disorder, current episode manic, severe with psychotic features ([place]) [F31.2] 04/03/2017  . Hepatitis C virus infection without hepatic coma [B19.20]   . Bipolar disorder ([place]) [F31.9]   . GERD (gastroesophageal reflux disease) [K21.9]    Total Time spent with patient: 30 minutes  Past Psychiatric History: see H&P  Past Medical  History:  Past Medical History:  Diagnosis Date  . Bipolar disorder ([place])   . GERD (gastroesophageal reflux disease)   . Hepatitis C virus infection without hepatic coma     Past Surgical History:  Procedure Laterality Date  . abdominal cyst removed    . CESAREAN SECTION    . COLON SURGERY     Family History: History reviewed. No pertinent family history. Family Psychiatric  History: see H&P Social History:  Social History   Substance and Sexual Activity  Alcohol Use No     Social History   Substance and Sexual Activity  Drug Use No    Social History   Socioeconomic History  . Marital status: Unknown    Spouse name: None  . Number of children: None  . Years of education: None  . Highest education level: None  Social Needs  . Financial resource strain: None  . Food insecurity - worry: None  . Food insecurity - inability: None  . Transportation needs - medical: None  . Transportation needs - non-medical: None  Occupational History  . None  Tobacco Use  . Smoking status: Current Every Day Smoker    Packs/day: 1.00    Types: Cigarettes  . Smokeless tobacco: Never Used  Substance and Sexual Activity  . Alcohol use: No  . Drug use: No  . Sexual activity: None  Other Topics Concern  . None  Social History Narrative  . None   Additional Social History:  Sleep: Fair  Appetite:  Fair  Current Medications: Current Facility-Administered Medications  Medication Dose Route Frequency Provider Last Rate Last Dose  . acetaminophen (TYLENOL) tablet 650 mg  650 mg Oral Q6H PRN Niel Hummer, NP      . alum & mag hydroxide-simeth (MAALOX/MYLANTA) 200-200-20 MG/5ML suspension 30 mL  30 mL Oral Q4H PRN Elmarie Shiley A, NP      . ARIPiprazole (ABILIFY) tablet 20 mg  20 mg Oral Daily Pennelope Bracken, MD   20 mg at 05/31/17 0847  . benztropine (COGENTIN) tablet 0.5 mg  0.5 mg Oral BID Elmarie Shiley A, NP   0.5 mg at 05/31/17 0848   . diltiazem (CARDIZEM CD) 24 hr capsule 120 mg  120 mg Oral Daily Elmarie Shiley A, NP   120 mg at 05/31/17 0847  . divalproex (DEPAKOTE) DR tablet 500 mg  500 mg Oral BID Elmarie Shiley A, NP   500 mg at 05/31/17 0847  . haloperidol (HALDOL) tablet 5 mg  5 mg Oral TID Pennelope Bracken, MD   5 mg at 05/31/17 1205  . haloperidol decanoate (HALDOL DECANOATE) 100 MG/ML injection 150 mg  150 mg Intramuscular Q30 days Pennelope Bracken, MD   150 mg at 05/31/17 1213  . LORazepam (ATIVAN) tablet 1 mg  1 mg Oral Q6H PRN Niel Hummer, NP   1 mg at 05/30/17 2213  . magnesium hydroxide (MILK OF MAGNESIA) suspension 30 mL  30 mL Oral Daily PRN Elmarie Shiley A, NP      . ondansetron [facility]) tablet 4 mg  4 mg Oral Q8H PRN Elmarie Shiley A, NP      . ziprasidone (GEODON) injection 10 mg  10 mg Intramuscular Q12H PRN Elmarie Shiley A, NP      . zolpidem (AMBIEN) tablet 5 mg  5 mg Oral QHS PRN Niel Hummer, NP   5 mg at 05/29/17 2110    Lab Results: No results found for this or any previous visit (from the past 48 hour(s)).  Blood Alcohol level:  Lab Results  Component Value Date   [facility] <5 05/23/2017   ETH <5 26/71/2458    Metabolic Disorder Labs: Lab Results  Component Value Date   HGBA1C 4.6 (L) 04/05/2017   MPG 85.32 04/05/2017   Lab Results  Component Value Date   PROLACTIN 50.6 (H) 04/05/2017   Lab Results  Component Value Date   CHOL 130 04/05/2017   TRIG 51 04/05/2017   HDL 57 04/05/2017   CHOLHDL 2.3 04/05/2017   VLDL 10 04/05/2017   LDLCALC 63 04/05/2017   LDLCALC 89 05/03/2012    Physical Findings: AIMS: Facial and Oral Movements Muscles of Facial Expression: None, normal Lips and Perioral Area: None, normal Jaw: None, normal Tongue: None, normal,Extremity Movements Upper (arms, wrists, hands, fingers): None, normal Lower (legs, knees, ankles, toes): None, normal, Trunk Movements Neck, shoulders, hips: None, normal, Overall Severity Severity of abnormal  movements (highest score from questions above): None, normal Incapacitation due to abnormal movements: None, normal Patient's awareness of abnormal movements (rate only patient's report): No Awareness, Dental Status Current problems with teeth and/or dentures?: No Does patient usually wear dentures?: No  CIWA:    COWS:     Musculoskeletal: Strength & Muscle Tone: within normal limits Gait & Station: normal Patient leans: N/A  Psychiatric Specialty Exam: Physical Exam  Nursing note and vitals reviewed.   Review of Systems  Constitutional: Negative for chills and fever.  Respiratory: Negative  for cough and shortness of breath.   Cardiovascular: Negative for chest pain.  Gastrointestinal: Negative for abdominal pain, heartburn, nausea and vomiting.  Psychiatric/Behavioral: Positive for hallucinations. Negative for depression, substance abuse and suicidal ideas.    Blood pressure (!) 104/59, pulse (!) 114, temperature 98.4 F (36.9 C), resp. rate 16, height 5\' 3"  (1.6 m), weight 73.9 kg (163 lb), SpO2 100 %.Body mass index is 28.87 kg/m.  General Appearance: Casual and Disheveled  Eye Contact:  Good  Speech:  Garbled and Normal Rate  Volume:  Normal  Mood:  Euthymic  Affect:  Appropriate, Congruent and Full Range  Thought Process:  Disorganized and Descriptions of Associations: Loose  Orientation:  Other:  oriented only to self, not oriented to year/month/location/city  Thought Content:  Illogical, Delusions and Ideas of Reference:   Delusions  Suicidal Thoughts:  No  Homicidal Thoughts:  No  Memory:  Immediate;   Good Recent;   Poor Remote;   Fair  Judgement:  Impaired  Insight:  Lacking  Psychomotor Activity:  Normal  Concentration:  Concentration: Fair  Recall:  [employer] of Knowledge:  Fair  Language:  Fair  Akathisia:  No  Handed:    AIMS (if indicated):     Assets:  Communication Skills Resilience Social Support Talents/Skills  ADL's:  Intact  Cognition:   WNL  Sleep:  Number of Hours: 6.75     Treatment Plan Summary: Daily contact with patient to assess and evaluate symptoms and progress in treatment and Medication management. Pt continues to be delusional and disorganized, but she may be near her baseline. She requires frequent redirection by staff. She is not oriented to location or date/year.  - Continue inpatient hospitalization - Schizophrenia             - Continue haldol 5mg  TID             - Continue haldol decanoate 150mg  IM q30 Days (next due today 05/31/17)             - Continue abilify 20mg  qDay             - continue cogentin 0.5mg  BID             - Continue ambien 5mg  qhs prn insomnia             - Continue depakote 500mg  BID           -encourage participation in groups and therapeutic milieu -Discharge planning will be ongoing  Pennelope Bracken, MD 05/31/2017, 12:14 PM

## 2017-05-31 NOTE — Progress Notes (Signed)
Did not attend group 

## 2017-05-31 NOTE — Progress Notes (Signed)
Recreation Therapy Notes  Date: 05/31/17 Time: 1000 Location: [address] Dayroom  Group Topic: Leisure Education  Goal Area(s) Addresses:  Patient will identify positive leisure activities.  Patient will identify one positive benefit of participation in leisure activities.   Behavioral Response: None  Intervention: [employer], dry erase marker, eraser, various words  Activity:  Pictionary.  LRT divided the group into two teams.  Each team was given one minute to draw/guess their picture.  If they did not guess the correct answer, the other team got a chance to still the point.    Education:  Leisure Education, Dentist  Education Outcome: Acknowledges education/In group clarification offered/Needs additional education  Clinical Observations/Feedback: Pt did not participate.  Pt left early but later returned.  Pt was appropriate while sitting in group.    Victorino Sparrow, LRT/CTRS         Victorino Sparrow A 05/31/2017 11:58 AM

## 2017-05-31 NOTE — Progress Notes (Signed)
Pt has been in and out of her room this evening.  She has required redirection as she tends to wander into other patient's rooms.  She denies SI/HI/AVH, but she seems to be responding to internal stimuli, and frequently mumbles to herself.  She is cooperative with staff and has been taking her medication.  Writer spoke with the evening provider about her sleep aid as pt slept only about 2.5 hrs last night.  He added a one time order of doxepin 25 mg to see if the pt would sleep any better.  Pt took her meds without incident.  Support and encouragement offered.  Discharge plans are in process.  Safety maintained with q15 minute checks.

## 2017-06-01 DIAGNOSIS — G4700 Insomnia, unspecified: Secondary | ICD-10-CM

## 2017-06-01 DIAGNOSIS — R443 Hallucinations, unspecified: Secondary | ICD-10-CM

## 2017-06-01 NOTE — Progress Notes (Signed)
Adult Psychoeducational Group Note  Date:  06/01/2017 Time:  9:47 PM  Group Topic/Focus:  Wrap-Up Group:   The focus of this group is to help patients review their daily goal of treatment and discuss progress on daily workbooks.  Participation Level:  Did Not Attend  Participation Quality:  Did not attend  Affect:  Did not attend  Cognitive:  Did not attend  Insight: None  Engagement in Group:  Did not attend  Modes of Intervention:  Did not attend  Additional Comments:  Pt did not attend evening wrap up group tonight.  Candy Sledge 06/01/2017, 9:47 PM

## 2017-06-01 NOTE — Plan of Care (Signed)
Pt safe on the unit at this time

## 2017-06-01 NOTE — Progress Notes (Signed)
[facility] MD Progress Note  06/01/2017 3:09 PM [name] Ball  MRN:  [id number]   Subjective:  [name] Ball reports " I am looking for daughter can you help me find them"  Objective:  [name] Ball is awake, alert and  pleasantly psychotic. Patient continues to ruminate with her missing children and states she is ready to go home. Patient becomes tearful while discussion her children. Denies that suicidal or homicidal. Patient reports she will be leaving early in the morning.  Patient continues to present with disorganized thoughts.Patient to continue with unit restricting due to high elopement risk. Support, encouragement and reassurance was provided.    Principal Problem: Schizophrenia ([place]) Diagnosis:   Patient Active Problem List   Diagnosis Date Noted  . Schizophrenia ([place]) [F20.9] 05/29/2017  . Bipolar disorder, current episode manic, severe with psychotic features ([place]) [F31.2] 04/03/2017  . Hepatitis C virus infection without hepatic coma [B19.20]   . Bipolar disorder ([place]) [F31.9]   . GERD (gastroesophageal reflux disease) [K21.9]    Total Time spent with patient: 30 minutes  Past Psychiatric History: see H&P  Past Medical History:  Past Medical History:  Diagnosis Date  . Bipolar disorder ([place])   . GERD (gastroesophageal reflux disease)   . Hepatitis C virus infection without hepatic coma     Past Surgical History:  Procedure Laterality Date  . abdominal cyst removed    . CESAREAN SECTION    . COLON SURGERY     Family History: History reviewed. No pertinent family history. Family Psychiatric  History: see H&P Social History:  Social History   Substance and Sexual Activity  Alcohol Use No     Social History   Substance and Sexual Activity  Drug Use No    Social History   Socioeconomic History  . Marital status: Unknown    Spouse name: None  . Number of children: None  . Years of education: None  . Highest education level: None  Social Needs  . Financial  resource strain: None  . Food insecurity - worry: None  . Food insecurity - inability: None  . Transportation needs - medical: None  . Transportation needs - non-medical: None  Occupational History  . None  Tobacco Use  . Smoking status: Current Every Day Smoker    Packs/day: 1.00    Types: Cigarettes  . Smokeless tobacco: Never Used  Substance and Sexual Activity  . Alcohol use: No  . Drug use: No  . Sexual activity: None  Other Topics Concern  . None  Social History Narrative  . None   Additional Social History:                         Sleep: Fair  Appetite:  Fair  Current Medications: Current Facility-Administered Medications  Medication Dose Route Frequency Provider Last Rate Last Dose  . acetaminophen (TYLENOL) tablet 650 mg  650 mg Oral Q6H PRN Niel Hummer, NP      . alum & mag hydroxide-simeth (MAALOX/MYLANTA) 200-200-20 MG/5ML suspension 30 mL  30 mL Oral Q4H PRN Elmarie Shiley A, NP      . ARIPiprazole (ABILIFY) tablet 20 mg  20 mg Oral Daily Pennelope Bracken, MD   20 mg at 06/01/17 0827  . benztropine (COGENTIN) tablet 0.5 mg  0.5 mg Oral BID Elmarie Shiley A, NP   0.5 mg at 06/01/17 0827  . diltiazem (CARDIZEM CD) 24 hr capsule 120 mg  120 mg Oral Daily Elmarie Shiley  A, NP   120 mg at 06/01/17 0827  . divalproex (DEPAKOTE) DR tablet 500 mg  500 mg Oral BID Elmarie Shiley A, NP   500 mg at 06/01/17 0827  . doxepin (SINEQUAN) capsule 25 mg  25 mg Oral QHS PRN Lindon Romp A, NP   25 mg at 05/31/17 2155  . haloperidol (HALDOL) tablet 5 mg  5 mg Oral TID Pennelope Bracken, MD   5 mg at 06/01/17 1143  . haloperidol decanoate (HALDOL DECANOATE) 100 MG/ML injection 150 mg  150 mg Intramuscular Q30 days Pennelope Bracken, MD   150 mg at 05/31/17 1213  . LORazepam (ATIVAN) tablet 1 mg  1 mg Oral Q6H PRN Niel Hummer, NP   1 mg at 05/31/17 2156  . magnesium hydroxide (MILK OF MAGNESIA) suspension 30 mL  30 mL Oral Daily PRN Niel Hummer, NP       . ondansetron [facility]) tablet 4 mg  4 mg Oral Q8H PRN Elmarie Shiley A, NP      . ziprasidone (GEODON) injection 10 mg  10 mg Intramuscular Q12H PRN Niel Hummer, NP        Lab Results: No results found for this or any previous visit (from the past 45 hour(s)).  Blood Alcohol level:  Lab Results  Component Value Date   [facility] <5 05/23/2017   ETH <5 78/29/5621    Metabolic Disorder Labs: Lab Results  Component Value Date   HGBA1C 4.6 (L) 04/05/2017   MPG 85.32 04/05/2017   Lab Results  Component Value Date   PROLACTIN 50.6 (H) 04/05/2017   Lab Results  Component Value Date   CHOL 130 04/05/2017   TRIG 51 04/05/2017   HDL 57 04/05/2017   CHOLHDL 2.3 04/05/2017   VLDL 10 04/05/2017   LDLCALC 63 04/05/2017   LDLCALC 89 05/03/2012    Physical Findings: AIMS: Facial and Oral Movements Muscles of Facial Expression: None, normal Lips and Perioral Area: None, normal Jaw: None, normal Tongue: None, normal,Extremity Movements Upper (arms, wrists, hands, fingers): None, normal Lower (legs, knees, ankles, toes): None, normal, Trunk Movements Neck, shoulders, hips: None, normal, Overall Severity Severity of abnormal movements (highest score from questions above): None, normal Incapacitation due to abnormal movements: None, normal Patient's awareness of abnormal movements (rate only patient's report): No Awareness, Dental Status Current problems with teeth and/or dentures?: No Does patient usually wear dentures?: No  CIWA:    COWS:     Musculoskeletal: Strength & Muscle Tone: within normal limits Gait & Station: normal Patient leans: N/A  Psychiatric Specialty Exam: Physical Exam  Nursing note and vitals reviewed. Constitutional: She appears well-developed.  Neurological: She is alert.  Psychiatric: She has a normal mood and affect. Her behavior is normal.    Review of Systems  Psychiatric/Behavioral: Positive for hallucinations. Negative for depression, substance  abuse and suicidal ideas.    Blood pressure (!) 104/59, pulse (!) 114, temperature 98.4 F (36.9 C), resp. rate 16, height 5\' 3"  (1.6 m), weight 73.9 kg (163 lb), SpO2 100 %.Body mass index is 28.87 kg/m.  General Appearance: Casual and Disheveled  Eye Contact:  Good  Speech:  Garbled and Normal Rate  Volume:  Normal  Mood:  Euthymic  Affect:  Appropriate, Congruent and Full Range  Thought Process:  Disorganized and Descriptions of Associations: Loose  Orientation:  Other:  to person and place   Thought Content:  Ideas of Reference:   Delusions  Suicidal Thoughts:  No  Homicidal Thoughts:  No  Memory:  Immediate;   Good Recent;   Poor Remote;   Fair  Judgement:  Impaired  Insight:  Lacking  Psychomotor Activity:  Normal  Concentration:  Concentration: Fair  Recall:  [employer] of Knowledge:  Fair  Language:  Fair  Akathisia:  No  Handed:    AIMS (if indicated):     Assets:  Communication Skills Resilience Social Support Talents/Skills  ADL's:  Intact  Cognition:  WNL  Sleep:  Number of Hours: 4.5     Treatment Plan Summary: Daily contact with patient to assess and evaluate symptoms and progress in treatment and Medication management.  - Continue with current treatment plan on 06/01/2017 except where noted.   - Schizophrenia             - Continue haldol 5mg  TID             - Continue haldol decanoate 150mg  IM q30 Days (next due today 05/31/17)             - Continue abilify 20mg  qDay             - continue cogentin 0.5mg  BID             - Continue ambien 5mg  qhs prn insomnia             - Continue depakote 500mg  BID           -encourage participation in groups and therapeutic milieu -Discharge planning will be ongoing  Derrill Center, NP 06/01/2017, 3:09 PM

## 2017-06-01 NOTE — Progress Notes (Signed)
D: Patient's self inventory sheet: patient has fair sleep, received sleep medication.fair  Appetite, normal energy level, did not address concentration. Rated depression "OK", hopeless 0/10, anxiety "okay". SI/HI/AVH: denies. Physical complaints are diarrhea, headache.Frequently at nurses station asking to be let out, stating that she needs to go take care of her family, get social security numbers and paperwork, get receipts. Nees frequent redirection and does not respond well to redirection. A: Medications administered, assessed medication knowledge and education given on medication regimen.  Emotional support and encouragement given patient. R: Denies SI and HI , contracts for safety. Safety maintained with 15 minute checks.

## 2017-06-01 NOTE — Progress Notes (Signed)
Pt continues confused and delusional.  She frequently comes to the NS asking nonsensical questions.  She also repeatedly asks for cigarettes which staff tells her is not allowed.  Pt has been asking staff to call her mother with a number that is incorrect.  Pt denies SI/HI/AVH.  Pt is redirectable at this time.  Support and encouragement offered.  Discharge plans are in process.  Safety maintained with q15 minute checks.

## 2017-06-01 NOTE — Progress Notes (Signed)
D: Pt denies SI/HI/AVH. Pt is pleasant and cooperative. Pt presents with flight of ideas, disorganized thoughts and continues to talk about leaving. Pt stayed in room majority of the evening.   A: Pt was offered support and encouragement.  Pt was encourage to attend groups. Q 15 minute checks were done for safety.  R:y maintained on unit.

## 2017-06-01 NOTE — [facility] Group Notes (Signed)
  [facility] LCSW Group Therapy Note  Date/Time:  06/01/2017 11:15AM-12:00PM  Type of Therapy and Topic:  Group Therapy:  Feelings About Hospitalization  Participation Level:  Active   Description of Group This process group involved patients discussing their feelings related to being hospitalized, as well as the benefits they see to being in the hospital.  These feelings and benefits were itemized.  The group then brainstormed specific ways in which they could seek those same benefits when they discharge and return home.  Therapeutic Goals 1. Patient will identify and describe positive and negative feelings related to hospitalization 2. Patient will verbalize benefits of hospitalization to themselves personally 3. Patients will brainstorm together ways they can obtain similar benefits in the outpatient setting, identify barriers to wellness and possible solutions  Summary of Patient Progress:  The patient expressed her primary feelings about being hospitalized are "they tricked me to get me here."  She spoke frequently throughout group, was difficult to understand, at times was on topic and at other times was completely off topic and somewhat paranoid.  Therapeutic Modalities Cognitive Behavioral Therapy Motivational Interviewing    Selmer Dominion, LCSW 06/01/2017, 12:27 PM

## 2017-06-02 NOTE — Progress Notes (Signed)
[facility] MD Progress Note  06/02/2017 9:35 AM [name] Ball  MRN:  [id number]   Subjective:  [name] Ball reports " I am ready to go home"   Objective:  [name] Ball seen standing at the nursing stations. Patient is tangential and disorganized with thoughts. Patient continues to request to be discharge.Patient continues to ruminate about her ex hubands and children.  Denies that suicidal or homicidal.Patient to continue with unit restricting due to high elopement risk. Support, encouragement and reassurance was provided.    Principal Problem: Schizophrenia ([place]) Diagnosis:   Patient Active Problem List   Diagnosis Date Noted  . Schizophrenia ([place]) [F20.9] 05/29/2017  . Bipolar disorder, current episode manic, severe with psychotic features ([place]) [F31.2] 04/03/2017  . Hepatitis C virus infection without hepatic coma [B19.20]   . Bipolar disorder ([place]) [F31.9]   . GERD (gastroesophageal reflux disease) [K21.9]    Total Time spent with patient: 30 minutes  Past Psychiatric History: see H&P  Past Medical History:  Past Medical History:  Diagnosis Date  . Bipolar disorder ([place])   . GERD (gastroesophageal reflux disease)   . Hepatitis C virus infection without hepatic coma     Past Surgical History:  Procedure Laterality Date  . abdominal cyst removed    . CESAREAN SECTION    . COLON SURGERY     Family History: History reviewed. No pertinent family history. Family Psychiatric  History: see H&P Social History:  Social History   Substance and Sexual Activity  Alcohol Use No     Social History   Substance and Sexual Activity  Drug Use No    Social History   Socioeconomic History  . Marital status: Unknown    Spouse name: None  . Number of children: None  . Years of education: None  . Highest education level: None  Social Needs  . Financial resource strain: None  . Food insecurity - worry: None  . Food insecurity - inability: None  . Transportation needs - medical: None   . Transportation needs - non-medical: None  Occupational History  . None  Tobacco Use  . Smoking status: Current Every Day Smoker    Packs/day: 1.00    Types: Cigarettes  . Smokeless tobacco: Never Used  Substance and Sexual Activity  . Alcohol use: No  . Drug use: No  . Sexual activity: None  Other Topics Concern  . None  Social History Narrative  . None   Additional Social History:                         Sleep: Fair  Appetite:  Fair  Current Medications: Current Facility-Administered Medications  Medication Dose Route Frequency Provider Last Rate Last Dose  . acetaminophen (TYLENOL) tablet 650 mg  650 mg Oral Q6H PRN Niel Hummer, NP      . alum & mag hydroxide-simeth (MAALOX/MYLANTA) 200-200-20 MG/5ML suspension 30 mL  30 mL Oral Q4H PRN Elmarie Shiley A, NP      . ARIPiprazole (ABILIFY) tablet 20 mg  20 mg Oral Daily Pennelope Bracken, MD   20 mg at 06/02/17 0741  . benztropine (COGENTIN) tablet 0.5 mg  0.5 mg Oral BID Elmarie Shiley A, NP   0.5 mg at 06/02/17 0741  . diltiazem (CARDIZEM CD) 24 hr capsule 120 mg  120 mg Oral Daily Elmarie Shiley A, NP   120 mg at 06/02/17 0741  . divalproex (DEPAKOTE) DR tablet 500 mg  500 mg Oral BID  Elmarie Shiley A, NP   500 mg at 06/02/17 0741  . doxepin (SINEQUAN) capsule 25 mg  25 mg Oral QHS PRN Lindon Romp A, NP   25 mg at 05/31/17 2155  . haloperidol (HALDOL) tablet 5 mg  5 mg Oral TID Pennelope Bracken, MD   5 mg at 06/02/17 0742  . haloperidol decanoate (HALDOL DECANOATE) 100 MG/ML injection 150 mg  150 mg Intramuscular Q30 days Pennelope Bracken, MD   150 mg at 05/31/17 1213  . LORazepam (ATIVAN) tablet 1 mg  1 mg Oral Q6H PRN Elmarie Shiley A, NP   1 mg at 06/02/17 7824  . magnesium hydroxide (MILK OF MAGNESIA) suspension 30 mL  30 mL Oral Daily PRN Niel Hummer, NP      . ondansetron [facility]) tablet 4 mg  4 mg Oral Q8H PRN Elmarie Shiley A, NP      . ziprasidone (GEODON) injection 10 mg  10 mg  Intramuscular Q12H PRN Niel Hummer, NP        Lab Results: No results found for this or any previous visit (from the past 28 hour(s)).  Blood Alcohol level:  Lab Results  Component Value Date   [facility] <5 05/23/2017   ETH <5 23/53/6144    Metabolic Disorder Labs: Lab Results  Component Value Date   HGBA1C 4.6 (L) 04/05/2017   MPG 85.32 04/05/2017   Lab Results  Component Value Date   PROLACTIN 50.6 (H) 04/05/2017   Lab Results  Component Value Date   CHOL 130 04/05/2017   TRIG 51 04/05/2017   HDL 57 04/05/2017   CHOLHDL 2.3 04/05/2017   VLDL 10 04/05/2017   LDLCALC 63 04/05/2017   LDLCALC 89 05/03/2012    Physical Findings: AIMS: Facial and Oral Movements Muscles of Facial Expression: None, normal Lips and Perioral Area: None, normal Jaw: None, normal Tongue: None, normal,Extremity Movements Upper (arms, wrists, hands, fingers): None, normal Lower (legs, knees, ankles, toes): None, normal, Trunk Movements Neck, shoulders, hips: None, normal, Overall Severity Severity of abnormal movements (highest score from questions above): None, normal Incapacitation due to abnormal movements: None, normal Patient's awareness of abnormal movements (rate only patient's report): No Awareness, Dental Status Current problems with teeth and/or dentures?: No Does patient usually wear dentures?: No  CIWA:    COWS:     Musculoskeletal: Strength & Muscle Tone: within normal limits Gait & Station: normal Patient leans: N/A  Psychiatric Specialty Exam: Physical Exam  Nursing note and vitals reviewed. Constitutional: She is oriented to person, place, and time. She appears well-developed.  Neurological: She is alert and oriented to person, place, and time.  Skin: Skin is warm.  Psychiatric: She has a normal mood and affect. Her behavior is normal.    Review of Systems  Psychiatric/Behavioral: Positive for hallucinations. Negative for depression, substance abuse and suicidal ideas.     Blood pressure 91/76, pulse (!) 114, temperature 98 F (36.7 C), temperature source Oral, resp. rate 18, height 5\' 3"  (1.6 m), weight 73.9 kg (163 lb), SpO2 100 %.Body mass index is 28.87 kg/m.  General Appearance: Casual and Disheveled  Eye Contact:  Good  Speech:  Garbled and Normal Rate  Volume:  Normal  Mood:  Euthymic  Affect:  Appropriate, Congruent and Full Range  Thought Process:  Disorganized and Descriptions of Associations: Loose  Orientation:  Other:  to person and place   Thought Content:  Ideas of Reference:   Delusions  Suicidal Thoughts:  No  Homicidal Thoughts:  No  Memory:  Immediate;   Good Recent;   Poor Remote;   Fair  Judgement:  Impaired  Insight:  Lacking  Psychomotor Activity:  Normal  Concentration:  Concentration: Fair  Recall:  [employer] of Knowledge:  Fair  Language:  Fair  Akathisia:  No  Handed:    AIMS (if indicated):     Assets:  Communication Skills Resilience Social Support Talents/Skills  ADL's:  Intact  Cognition:  WNL  Sleep:  Number of Hours: 5.75     Treatment Plan Summary: Daily contact with patient to assess and evaluate symptoms and progress in treatment and Medication management.  - Continue with current treatment plan on 06/02/2017 except where noted.   - Schizophrenia             - Continue haldol 5mg  TID             - Continue haldol decanoate 150mg  IM q30 Days (next due today 05/31/17)             - Continue abilify 20mg  qDay             - continue cogentin 0.5mg  BID             - Continue ambien 5mg  qhs prn insomnia             - Continue depakote 500mg  BID           -encourage participation in groups and therapeutic milieu -Discharge planning will be ongoing  Derrill Center, NP 06/02/2017, 9:35 AM

## 2017-06-02 NOTE — [facility] Group Notes (Signed)
[facility] LCSW Group Therapy Note  Date/Time:  06/02/2017  11:00AM-12:00PM  Type of Therapy and Topic:  Group Therapy:  Music and Mood  Participation Level:  Minimal   Description of Group: In this process group, members listened to a variety of genres of music and identified that different types of music evoke different responses.  Patients were encouraged to identify music that was soothing for them and music that was energizing for them.  Patients discussed how this knowledge can help with wellness and recovery in various ways including managing depression and anxiety as well as encouraging healthy sleep habits.    Therapeutic Goals: 1. Patients will explore the impact of different varieties of music on mood 2. Patients will verbalize the thoughts they have when listening to different types of music 3. Patients will identify music that is soothing to them as well as music that is energizing to them 4. Patients will discuss how to use this knowledge to assist in maintaining wellness and recovery 5. Patients will explore the use of music as a coping skill  Summary of Patient Progress:  At the beginning of group, patient expressed she felt peaceful, and she was intrusive at times, finally left group and did not return.  Therapeutic Modalities: Solution Focused Brief Therapy Motivational Interviewing Activity   Selmer Dominion, LCSW 06/02/2017 8:23 AM

## 2017-06-02 NOTE — Progress Notes (Signed)
D: Pt denies SI/HI/AVH. Pt is pleasant and cooperative. Pt continues to be delusional at times with disorganized thought process .   A: Pt was offered support and encouragement. Pt was given scheduled medications. Pt was encourage to attend groups. Q 15 minute checks were done for safety.   R:Pt attends groups and interacts well with peers and staff. Pt is taking medication. Pt has no complaints.Pt receptive to treatment and safety maintained on unit.

## 2017-06-02 NOTE — [facility] Group Notes (Signed)
[facility] Group Notes:  (Nursing)  Date:  06/02/2017  Time:  1:30 Type of Therapy:  Nurse Education  Participation Level:  Did Not Attend  Participation Quality:  did not attend  Affect:  didn't attend  Cognitive:  didn't attend  Insight:  None  Engagement in Group:  None  Modes of Intervention:  didn't attend  Summary of Progress/Problems: Patient did not attend group  [name] Ball 06/02/2017, 3:49 PM

## 2017-06-02 NOTE — Progress Notes (Signed)
Continues intrusive, tangential, requesting to be released so that she can go take care of her children, requesting that her doctor be called, stating that her child is here in the hospital too. Intensity of statements and requests decreased and patient is more redirectable. Patient left unit with NP and behaved appropriately; unit restriction discontinued at this time to evaluate patient off unit.

## 2017-06-03 NOTE — Progress Notes (Signed)
Recreation Therapy Notes  Date: 06/03/17 Time: 1000 Location: [address] Dayroom  Group Topic: Coping Skills  Goal Area(s) Addresses:  Patient will be able to identify positive coping skills. Patient will be able to identify the importance of using coping skills. Patient will be able to identify the benefits of using coping skills post d/c.  Intervention: Mind map, pencils, white board, marker, eraser  Activity: Mind map.  Patients were given a blank mind map.  LRT and patients filled in the first eight boxes together with anxiety, mourning a loss, depression, stop smoking, not being able to sleep, anger, loneliness and pain.  Individually, patients were to identify at least three coping skills for each situation identified.  Group would come back together and LRT would right the coping skills identified on the board.  Education: Radiographer, therapeutic, Dentist.   Education Outcome: Acknowledges understanding/In group clarification offered/Needs additional education.   Clinical Observations/Feedback: Pt did not attend group.    Victorino Sparrow, LRT/CTRS       Victorino Sparrow A 06/03/2017 12:14 PM

## 2017-06-03 NOTE — Progress Notes (Signed)
[facility] MD Progress Note  06/03/2017 12:30 PM [name] Ball  MRN:  [id number] Subjective:   [name] Ball is a 51 y/o F with history of schizophrenia vs bipolar with psychotic features who was admitted initially to ED with worsening symptoms of psychosis including inability to take care of herself, speaking about things that were not occurring, rambling, and wandering off from her group home. She was restarted on medications and transferred to [facility] for further stabilization. Today upon evaluation, pt continues to have baseline delusions and disorganization. She perseverates on discharge, but when asked to provide a plan for what she will do after discharge, pt provides tangential response about visiting her family. Pt states the date is "September 24th" and the year is "102." She believes she is in "[place]." She endorses multiple delusions that her children are in the building and we are keeping her from seeing them. Attempted to redirect patient as she has been trying to elope, and pt was reminded that she needs to stay on the unit and to stay at her group home when she gets back there. Pt will meet with her group home staff to assess if she is near baseline. She has been sleeping well and her appetite is good. She denies SI/HI/AH/VH. She is tolerating her medications without difficulty. She had no further questions, comments, or concerns.    Principal Problem: Schizophrenia ([place]) Diagnosis:   Patient Active Problem List   Diagnosis Date Noted  . Schizophrenia ([place]) [F20.9] 05/29/2017  . Bipolar disorder, current episode manic, severe with psychotic features ([place]) [F31.2] 04/03/2017  . Hepatitis C virus infection without hepatic coma [B19.20]   . Bipolar disorder ([place]) [F31.9]   . GERD (gastroesophageal reflux disease) [K21.9]    Total Time spent with patient: 30 minutes  Past Psychiatric History: see H&P  Past Medical History:  Past Medical History:  Diagnosis Date  . Bipolar disorder  ([place])   . GERD (gastroesophageal reflux disease)   . Hepatitis C virus infection without hepatic coma     Past Surgical History:  Procedure Laterality Date  . abdominal cyst removed    . CESAREAN SECTION    . COLON SURGERY     Family History: History reviewed. No pertinent family history. Family Psychiatric  History: see H&P Social History:  Social History   Substance and Sexual Activity  Alcohol Use No     Social History   Substance and Sexual Activity  Drug Use No    Social History   Socioeconomic History  . Marital status: Unknown    Spouse name: None  . Number of children: None  . Years of education: None  . Highest education level: None  Social Needs  . Financial resource strain: None  . Food insecurity - worry: None  . Food insecurity - inability: None  . Transportation needs - medical: None  . Transportation needs - non-medical: None  Occupational History  . None  Tobacco Use  . Smoking status: Current Every Day Smoker    Packs/day: 1.00    Types: Cigarettes  . Smokeless tobacco: Never Used  Substance and Sexual Activity  . Alcohol use: No  . Drug use: No  . Sexual activity: None  Other Topics Concern  . None  Social History Narrative  . None   Additional Social History:                         Sleep: Good  Appetite:  Good  Current  Medications: Current Facility-Administered Medications  Medication Dose Route Frequency Provider Last Rate Last Dose  . acetaminophen (TYLENOL) tablet 650 mg  650 mg Oral Q6H PRN Niel Hummer, NP      . alum & mag hydroxide-simeth (MAALOX/MYLANTA) 200-200-20 MG/5ML suspension 30 mL  30 mL Oral Q4H PRN Elmarie Shiley A, NP      . ARIPiprazole (ABILIFY) tablet 20 mg  20 mg Oral Daily Pennelope Bracken, MD   20 mg at 06/03/17 0748  . benztropine (COGENTIN) tablet 0.5 mg  0.5 mg Oral BID Elmarie Shiley A, NP   0.5 mg at 06/03/17 0747  . diltiazem (CARDIZEM CD) 24 hr capsule 120 mg  120 mg Oral Daily  Elmarie Shiley A, NP   120 mg at 06/03/17 0748  . divalproex (DEPAKOTE) DR tablet 500 mg  500 mg Oral BID Elmarie Shiley A, NP   500 mg at 06/03/17 0747  . doxepin (SINEQUAN) capsule 25 mg  25 mg Oral QHS PRN Lindon Romp A, NP   25 mg at 06/02/17 2106  . haloperidol (HALDOL) tablet 5 mg  5 mg Oral TID Pennelope Bracken, MD   5 mg at 06/03/17 1135  . haloperidol decanoate (HALDOL DECANOATE) 100 MG/ML injection 150 mg  150 mg Intramuscular Q30 days Pennelope Bracken, MD   150 mg at 05/31/17 1213  . LORazepam (ATIVAN) tablet 1 mg  1 mg Oral Q6H PRN Niel Hummer, NP   1 mg at 06/03/17 4010  . magnesium hydroxide (MILK OF MAGNESIA) suspension 30 mL  30 mL Oral Daily PRN Niel Hummer, NP      . ondansetron [facility]) tablet 4 mg  4 mg Oral Q8H PRN Elmarie Shiley A, NP      . ziprasidone (GEODON) injection 10 mg  10 mg Intramuscular Q12H PRN Niel Hummer, NP        Lab Results: No results found for this or any previous visit (from the past 67 hour(s)).  Blood Alcohol level:  Lab Results  Component Value Date   [facility] <5 05/23/2017   ETH <5 27/25/3664    Metabolic Disorder Labs: Lab Results  Component Value Date   HGBA1C 4.6 (L) 04/05/2017   MPG 85.32 04/05/2017   Lab Results  Component Value Date   PROLACTIN 50.6 (H) 04/05/2017   Lab Results  Component Value Date   CHOL 130 04/05/2017   TRIG 51 04/05/2017   HDL 57 04/05/2017   CHOLHDL 2.3 04/05/2017   VLDL 10 04/05/2017   LDLCALC 63 04/05/2017   LDLCALC 89 05/03/2012    Physical Findings: AIMS: Facial and Oral Movements Muscles of Facial Expression: None, normal Lips and Perioral Area: None, normal Jaw: None, normal Tongue: None, normal,Extremity Movements Upper (arms, wrists, hands, fingers): None, normal Lower (legs, knees, ankles, toes): None, normal, Trunk Movements Neck, shoulders, hips: None, normal, Overall Severity Severity of abnormal movements (highest score from questions above): None,  normal Incapacitation due to abnormal movements: None, normal Patient's awareness of abnormal movements (rate only patient's report): No Awareness, Dental Status Current problems with teeth and/or dentures?: No Does patient usually wear dentures?: No  CIWA:    COWS:     Musculoskeletal: Strength & Muscle Tone: within normal limits Gait & Station: normal Patient leans: Backward  Psychiatric Specialty Exam: Physical Exam  Nursing note and vitals reviewed.   Review of Systems  Constitutional: Negative for chills and fever.  Respiratory: Negative for cough and shortness of breath.  Cardiovascular: Negative for chest pain.  Gastrointestinal: Negative for heartburn.  Psychiatric/Behavioral: Negative for depression, hallucinations and suicidal ideas.    Blood pressure 106/82, pulse (!) 108, temperature 97.7 F (36.5 C), temperature source Oral, resp. rate 18, height 5\' 3"  (1.6 m), weight 73.9 kg (163 lb), SpO2 100 %.Body mass index is 28.87 kg/m.  General Appearance: Casual and Disheveled  Eye Contact:  Good  Speech:  Clear and Coherent  Volume:  Normal  Mood:  Anxious and Irritable  Affect:  Blunt and Congruent  Thought Process:  Disorganized and Descriptions of Associations: Loose  Orientation:  Other:  only oriented to self, not oriented to location or year  Thought Content:  Delusions  Suicidal Thoughts:  No  Homicidal Thoughts:  No  Memory:  Immediate;   Fair Recent;   Poor Remote;   Poor  Judgement:  Poor  Insight:  Lacking  Psychomotor Activity:  Normal  Concentration:  Concentration: Good  Recall:  Poor  Fund of Knowledge:  Fair  Language:  Fair  Akathisia:  No  Handed:  Right  AIMS (if indicated):     Assets:  Communication Skills Housing  ADL's:  Intact  Cognition:  WNL  Sleep:  Number of Hours: 3.25     Treatment Plan Summary: Daily contact with patient to assess and evaluate symptoms and progress in treatment and Medication management. Pt remains  delusional, disorganized, and disoriented. She requires redirection by staff to reduce elopement concerns. She will be evaluated by her group home staff for comparison to baseline.  - Continue inpatient hospitalization - Schizophrenia - Continue haldol 5mg  TID - Continue haldol decanoate 150mg  IM q30 Days (last given 05/31/17) - Continue abilify 20mg  qDay - continue cogentin 0.5mg  BID - Continue sinequan 25mg  qhs prn insomnia  - Continue depakote 500mg  BID  -encourage participation in groups and therapeutic milieu -Discharge planning will be ongoing     Pennelope Bracken, MD 06/03/2017, 12:30 PM

## 2017-06-03 NOTE — Progress Notes (Signed)
Pt up to the nursing station stating she is going to leave.

## 2017-06-03 NOTE — Progress Notes (Signed)
D: Pt continues to be confused, disorganized and needing constant re-direction when up in the milieu. Pt woke up requesting medications to help her go to sleep. Pt was given medications per [facility] and told to lay down due to the medications possibly making her drowsy.   A: Pt was offered support and encouragement. Pt was given scheduled medications. Pt was encourage to attend groups. Q 15 minute checks were done for safety.   R:Pt attends groups and interacts well with peers and staff. Pt is taking medication. Pt has no complaints.Pt receptive to treatment and safety maintained on unit.

## 2017-06-03 NOTE — Progress Notes (Signed)
DAR NOTE: Patient remained delusional, disorganized and agitated.  Patient preoccupied with getting discharged.  Patient redirected several times.  Patient continues to stand by the door most of this shift.  Attempted to elope of the unit several times.  Denies pain, auditory and visual hallucinations.  Rates depression at 0, hopelessness at 0, and anxiety at 0.  Maintained on routine safety checks.  Medications given as prescribed.  Support and encouragement offered as needed.  Ativan 1 mg given for agitation with poor effect.  Geodon 10 mg IM given for severe agitation with good effect.  Patient safe on the unit.

## 2017-06-03 NOTE — Progress Notes (Signed)
Pt continues to come out her room, pt disorganized, but appears very drowsy, but pt has been refusing to stay laying down in her room after repeated request for her to do so. Pt appears very sleepy but refused to lay down, pt was redirected to her room and asked to lay down to get some rest.

## 2017-06-03 NOTE — Progress Notes (Signed)
Pt continues to come up to the nursing station, delusional and irritable at times. Pt goes to the double doors and looks out. Pt continues to state she wants to leave. Pt re directed

## 2017-06-03 NOTE — Progress Notes (Signed)
When patients were trying to go out the door for breakfast, pt stood at the door stating she was leaving. Pt presents with delusions , confabulations, forgetfulness and disorientation.

## 2017-06-04 DIAGNOSIS — F0391 Unspecified dementia with behavioral disturbance: Secondary | ICD-10-CM

## 2017-06-04 DIAGNOSIS — F03918 Unspecified dementia, unspecified severity, with other behavioral disturbance: Secondary | ICD-10-CM

## 2017-06-04 NOTE — Progress Notes (Signed)
Dar Note: Patient presents with irritable affect and labile mood.  Patient remained disorganized, delusional and tangential in speech.  Patient continues to be preoccupied with getting out of here to go pick up her kids.  Patient made several attempts to elope from the unit.  Patient continues to require lots of redirection on the unit but continues to block the entrance to the unit.  Ativan 1 mg given x 2 and Geodon given with poor effect.  Routine safety checks maintained.  Support and encouragements offered as needed.  Patient is safe on the unit.

## 2017-06-04 NOTE — Progress Notes (Signed)
[facility] MD Progress Note  06/04/2017 1:10 PM Domingue Coltrain  MRN:  [id number] Subjective:    [name] Ball is a 51 y/o F with history of schizophrenia vs bipolar with psychotic features who was admitted initially to ED with worsening symptoms of psychosis including inability to take care of herself, speaking about things that were not occurring, rambling, and wandering off from her group home.She was restarted on medications and transferred to [facility] for further stabilization.  Today upon evaluation, pt continues to be disorganized and delusional regarding visiting her children elsewhere in the building. She has been attempting to to elope from the unit so she was placed on unit precautions. She denies SI/HI/AH/VH; however, she reports seeing her children in the courtyard. She has been sleeping well and her appetite is good. She notes that she is tolerating her medications without difficulty. Pt has several tangential statements during her interview, but she is able to be redirected. Administered MOCA to pt and she scored 4/30 with significant difficulty with executive function (1/5), naming (1/3), attention (0/6), language (0/3), delayed recall (0/5), and orientation (1/6). Pt was able to correctly name the year as "2018" when she had previously stated the year was "23."   Discussed with patient that she has significant cognitive difficulties that would suggest she needs an appropriate level of care to address her cognitive deficits. Discussed with patient that we will continue her current regimen and examine what available resources SW team can refer her to. Pt had no further questions, comments, or concerns.    Principal Problem: Schizophrenia ([place]) Diagnosis:   Patient Active Problem List   Diagnosis Date Noted  . Dementia with behavioral disturbance [F03.91] 06/04/2017  . Schizophrenia ([place]) [F20.9] 05/29/2017  . Bipolar disorder, current episode manic, severe with psychotic features ([place])  [F31.2] 04/03/2017  . Hepatitis C virus infection without hepatic coma [B19.20]   . Bipolar disorder ([place]) [F31.9]   . GERD (gastroesophageal reflux disease) [K21.9]    Total Time spent with patient: 30 minutes  Past Psychiatric History: see H&P  Past Medical History:  Past Medical History:  Diagnosis Date  . Bipolar disorder ([place])   . GERD (gastroesophageal reflux disease)   . Hepatitis C virus infection without hepatic coma     Past Surgical History:  Procedure Laterality Date  . abdominal cyst removed    . CESAREAN SECTION    . COLON SURGERY     Family History: History reviewed. No pertinent family history. Family Psychiatric  History: see H&P Social History:  Social History   Substance and Sexual Activity  Alcohol Use No     Social History   Substance and Sexual Activity  Drug Use No    Social History   Socioeconomic History  . Marital status: Unknown    Spouse name: None  . Number of children: None  . Years of education: None  . Highest education level: None  Social Needs  . Financial resource strain: None  . Food insecurity - worry: None  . Food insecurity - inability: None  . Transportation needs - medical: None  . Transportation needs - non-medical: None  Occupational History  . None  Tobacco Use  . Smoking status: Current Every Day Smoker    Packs/day: 1.00    Types: Cigarettes  . Smokeless tobacco: Never Used  Substance and Sexual Activity  . Alcohol use: No  . Drug use: No  . Sexual activity: None  Other Topics Concern  . None  Social History Narrative  .  None   Additional Social History:                         Sleep: Good  Appetite:  Good  Current Medications: Current Facility-Administered Medications  Medication Dose Route Frequency Provider Last Rate Last Dose  . acetaminophen (TYLENOL) tablet 650 mg  650 mg Oral Q6H PRN Niel Hummer, NP      . alum & mag hydroxide-simeth (MAALOX/MYLANTA) 200-200-20 MG/5ML  suspension 30 mL  30 mL Oral Q4H PRN Elmarie Shiley A, NP      . ARIPiprazole (ABILIFY) tablet 20 mg  20 mg Oral Daily Pennelope Bracken, MD   20 mg at 06/04/17 7782  . benztropine (COGENTIN) tablet 0.5 mg  0.5 mg Oral BID Elmarie Shiley A, NP   0.5 mg at 06/04/17 4235  . diltiazem (CARDIZEM CD) 24 hr capsule 120 mg  120 mg Oral Daily Elmarie Shiley A, NP   120 mg at 06/04/17 3614  . divalproex (DEPAKOTE) DR tablet 500 mg  500 mg Oral BID Elmarie Shiley A, NP   500 mg at 06/04/17 4315  . doxepin (SINEQUAN) capsule 25 mg  25 mg Oral QHS PRN Lindon Romp A, NP   25 mg at 06/03/17 2343  . haloperidol (HALDOL) tablet 5 mg  5 mg Oral TID Pennelope Bracken, MD   5 mg at 06/04/17 0829  . haloperidol decanoate (HALDOL DECANOATE) 100 MG/ML injection 150 mg  150 mg Intramuscular Q30 days Pennelope Bracken, MD   150 mg at 05/31/17 1213  . LORazepam (ATIVAN) tablet 1 mg  1 mg Oral Q6H PRN Elmarie Shiley A, NP   1 mg at 06/04/17 0857  . magnesium hydroxide (MILK OF MAGNESIA) suspension 30 mL  30 mL Oral Daily PRN Elmarie Shiley A, NP      . ondansetron [facility]) tablet 4 mg  4 mg Oral Q8H PRN Elmarie Shiley A, NP      . ziprasidone (GEODON) injection 10 mg  10 mg Intramuscular Q12H PRN Niel Hummer, NP   10 mg at 06/04/17 1207    Lab Results: No results found for this or any previous visit (from the past 48 hour(s)).  Blood Alcohol level:  Lab Results  Component Value Date   [facility] <5 05/23/2017   ETH <5 40/01/6760    Metabolic Disorder Labs: Lab Results  Component Value Date   HGBA1C 4.6 (L) 04/05/2017   MPG 85.32 04/05/2017   Lab Results  Component Value Date   PROLACTIN 50.6 (H) 04/05/2017   Lab Results  Component Value Date   CHOL 130 04/05/2017   TRIG 51 04/05/2017   HDL 57 04/05/2017   CHOLHDL 2.3 04/05/2017   VLDL 10 04/05/2017   LDLCALC 63 04/05/2017   LDLCALC 89 05/03/2012    Physical Findings: AIMS: Facial and Oral Movements Muscles of Facial Expression: None,  normal Lips and Perioral Area: None, normal Jaw: None, normal Tongue: None, normal,Extremity Movements Upper (arms, wrists, hands, fingers): None, normal Lower (legs, knees, ankles, toes): None, normal, Trunk Movements Neck, shoulders, hips: None, normal, Overall Severity Severity of abnormal movements (highest score from questions above): None, normal Incapacitation due to abnormal movements: None, normal Patient's awareness of abnormal movements (rate only patient's report): No Awareness, Dental Status Current problems with teeth and/or dentures?: No Does patient usually wear dentures?: No  CIWA:    COWS:     Musculoskeletal: Strength & Muscle Tone: within normal limits Gait &  Station: normal Patient leans: N/A  Psychiatric Specialty Exam: Physical Exam  Nursing note and vitals reviewed.   Review of Systems  Constitutional: Negative for chills and fever.  Respiratory: Negative for cough and shortness of breath.   Cardiovascular: Negative for chest pain.  Gastrointestinal: Negative for heartburn and nausea.  Psychiatric/Behavioral: Positive for hallucinations. Negative for depression and suicidal ideas. The patient is nervous/anxious.     Blood pressure 131/71, pulse (!) 105, temperature 97.7 F (36.5 C), temperature source Oral, resp. rate 18, height 5\' 3"  (1.6 m), weight 73.9 kg (163 lb), SpO2 100 %.Body mass index is 28.87 kg/m.  General Appearance: Disheveled  Eye Contact:  Good  Speech:  Clear and Coherent and Normal Rate  Volume:  Normal  Mood:  Anxious and Irritable  Affect:  Appropriate  Thought Process:  Disorganized and Descriptions of Associations: Tangential  Orientation:  Other:  oriented to self and year only  Thought Content:  Delusions, Hallucinations: Visual and Paranoid Ideation  Suicidal Thoughts:  No  Homicidal Thoughts:  No  Memory:  Immediate;   Poor Recent;   Poor Remote;   Poor  Judgement:  Impaired  Insight:  Lacking  Psychomotor Activity:   Normal  Concentration:  Concentration: Poor  Recall:  Poor  Fund of Knowledge:  Fair  Language:  Fair  Akathisia:  No  Handed:    AIMS (if indicated):     Assets:  Communication Skills Leisure Time Physical Health Resilience  ADL's:  Intact  Cognition:  WNL  Sleep:  Number of Hours: 8.25     Treatment Plan Summary: Daily contact with patient to assess and evaluate symptoms and progress in treatment and Medication management. Pt continues to have delusions, disorganization, and disorientation. She continues to attempt to elope. She has MOCA score of 4/30. We will continue her current treatment plan and SW team will look into possible alternative placements if pt requires higher level of care for her cognitive symptoms.  - Continue inpatient hospitalization - Schizophrenia - Continuehaldol 5mg  TID - Continue haldol decanoate 150mg  IM q30 Days (last given11/30/18) - Continue abilify 20mg  qDay - continue cogentin 0.5mg  BID - Continue sinequan 25mg  qhs prn insomnia  - Continue depakote 500mg  BID - Dementia with behavioral disturbance r/o Alzhemier's versus cognitive disorder unspecified   - SW team to work with group home staff regarding if pt is appropriate to return or if she requires higher level of care  -encourage participation in groups and therapeutic milieu -Discharge planning will be ongoing    Pennelope Bracken, MD 06/04/2017, 1:10 PM

## 2017-06-04 NOTE — Progress Notes (Signed)
Adult Psychoeducational Group Note  Date:  06/04/2017 Time:  8:49 PM  Group Topic/Focus:  Wrap-Up Group:   The focus of this group is to help patients review their daily goal of treatment and discuss progress on daily workbooks.  Participation Level:  Active  Participation Quality:  Appropriate  Affect:  Appropriate  Cognitive:  Appropriate  Insight: Appropriate  Engagement in Group:  Engaged  Modes of Intervention:  Discussion  Additional Comments:  The patient expressed that she rates today a 10.  Nash Shearer 06/04/2017, 8:49 PM

## 2017-06-05 MED ORDER — OLANZAPINE 5 MG PO TBDP
5.0000 mg | ORAL_TABLET | Freq: Three times a day (TID) | ORAL | Status: DC | PRN
  Administered 2017-06-06 – 2017-06-09 (×3): 5 mg via ORAL
  Filled 2017-06-05 (×4): qty 1

## 2017-06-05 MED ORDER — HALOPERIDOL 5 MG PO TABS
5.0000 mg | ORAL_TABLET | Freq: Three times a day (TID) | ORAL | Status: DC | PRN
  Administered 2017-06-05: 5 mg via ORAL
  Filled 2017-06-05: qty 1

## 2017-06-05 NOTE — Progress Notes (Signed)
D: Pt was standing at the entrance to the unit prior to the start of the shift. Pt had to be prompted to leave the door. Once the writer was on the unit pt started talking about "her kids" and attempting to get her peers as well as staff to give her kids snacks. Pt also attempted to elope several times from the unit. Pt has no other questions or concerns.    A:  Support and encouragement was offered. 15 min checks continued for safety.  R: Pt remains safe.

## 2017-06-05 NOTE — Progress Notes (Signed)
Pt attempted to come into the hall without clothes on. Writer had to be redirected and encouraged to stay in her room until she put on clothes.

## 2017-06-05 NOTE — Progress Notes (Signed)
Recreation Therapy Notes  Date: 06/05/17 Time: 1000 Location: 500 Hall Dayroom  Group Topic: Anger Management  Goal Area(s) Addresses:  Patient will identify triggers for anger.  Patient will identify physical reaction to anger.   Patient will identify benefit of using coping skills when angry.  Intervention: Worksheet  Activity: [employer].  Patients were given a worksheet with an empty umbrella.  Patients were to identify and write in the umbrella the emotions and situations that can cause anger.  Patients were to then write at least 5 coping skills on the outside of the umbrella they could use to deal with their anger.  Education: Anger Management, Discharge Planning   Education Outcome: Acknowledges education/In group clarification offered/Needs additional education.   Clinical Observations/Feedback:  Pt did not attend group.   Victorino Sparrow, LRT/CTRS          Victorino Sparrow A 06/05/2017 12:58 PM

## 2017-06-05 NOTE — Progress Notes (Signed)
1:1 Note Pt is constantly requiring redirection which she is not following. Pt has been constantly barricading the main entrance door. It hard for staff to get in and out because pt gets combative each time pt is told to step away from the door. Pt push her way out when other pt were coming back from lunch. It was not easy getting her back to the unit. Pt constantly trying to push her way out when the the door. Its even difficult with only one nurse on the unit.

## 2017-06-05 NOTE — B[facility] Counselor (Signed)
Spoke to [facility] [phone].  Informed her of change in status of diagnosis.  She stated she would email me a list of locked facilitites that specialize in both memory care and psychosis that are in network with [employer]

## 2017-06-05 NOTE — Progress Notes (Signed)
[facility] MD Progress Note  06/05/2017 12:56 PM [name] Ball  MRN:  [id number] Subjective:   [name] Ball is a 51 y/o F with history of schizophrenia vs bipolar with psychotic features who was admitted initially to ED with worsening symptoms of psychosis including inability to take care of herself, speaking about things that were not occurring, rambling, and wandering off from her group home.She was restarted on medications and transferred to [facility] for further stabilization.  Today upon evaluation, pt continues to have behaviors of attempting to elope to see her children as per RN staff, and she requires frequent redirection. Pt was attempting to elope from hall when interview was initiated and pt explained, "Sir, I have got to see my children." Pt was redirected that she would be able to see her family after discharge or if they came to visit her. Pt remains tangential and disorganized, but she is able to be redirected. She denies SI/HI/AH/VH. She is not oriented to location but she correctly states that the year is "eighteen."   Discussed with patient that we are attempting to find somewhere for her to stay that will accept her memory difficulties, and pt demanded discharge so that she could go see her children.    Principal Problem: Schizophrenia ([place]) Diagnosis:   Patient Active Problem List   Diagnosis Date Noted  . Dementia with behavioral disturbance [F03.91] 06/04/2017  . Schizophrenia ([place]) [F20.9] 05/29/2017  . Bipolar disorder, current episode manic, severe with psychotic features ([place]) [F31.2] 04/03/2017  . Hepatitis C virus infection without hepatic coma [B19.20]   . Bipolar disorder ([place]) [F31.9]   . GERD (gastroesophageal reflux disease) [K21.9]    Total Time spent with patient: 30 minutes  Past Psychiatric History: see H&P  Past Medical History:  Past Medical History:  Diagnosis Date  . Bipolar disorder ([place])   . GERD (gastroesophageal reflux disease)   . Hepatitis C  virus infection without hepatic coma     Past Surgical History:  Procedure Laterality Date  . abdominal cyst removed    . CESAREAN SECTION    . COLON SURGERY     Family History: History reviewed. No pertinent family history. Family Psychiatric  History: see H&P Social History:  Social History   Substance and Sexual Activity  Alcohol Use No     Social History   Substance and Sexual Activity  Drug Use No    Social History   Socioeconomic History  . Marital status: Unknown    Spouse name: None  . Number of children: None  . Years of education: None  . Highest education level: None  Social Needs  . Financial resource strain: None  . Food insecurity - worry: None  . Food insecurity - inability: None  . Transportation needs - medical: None  . Transportation needs - non-medical: None  Occupational History  . None  Tobacco Use  . Smoking status: Current Every Day Smoker    Packs/day: 1.00    Types: Cigarettes  . Smokeless tobacco: Never Used  Substance and Sexual Activity  . Alcohol use: No  . Drug use: No  . Sexual activity: None  Other Topics Concern  . None  Social History Narrative  . None   Additional Social History:                         Sleep: Fair  Appetite:  Fair  Current Medications: Current Facility-Administered Medications  Medication Dose Route Frequency Provider Last Rate  Last Dose  . acetaminophen (TYLENOL) tablet 650 mg  650 mg Oral Q6H PRN Niel Hummer, NP      . alum & mag hydroxide-simeth (MAALOX/MYLANTA) 200-200-20 MG/5ML suspension 30 mL  30 mL Oral Q4H PRN Elmarie Shiley A, NP      . ARIPiprazole (ABILIFY) tablet 20 mg  20 mg Oral Daily Pennelope Bracken, MD   20 mg at 06/05/17 0758  . benztropine (COGENTIN) tablet 0.5 mg  0.5 mg Oral BID Elmarie Shiley A, NP   0.5 mg at 06/05/17 0758  . diltiazem (CARDIZEM CD) 24 hr capsule 120 mg  120 mg Oral Daily Elmarie Shiley A, NP   120 mg at 06/05/17 0757  . divalproex (DEPAKOTE)  DR tablet 500 mg  500 mg Oral BID Elmarie Shiley A, NP   500 mg at 06/05/17 0758  . doxepin (SINEQUAN) capsule 25 mg  25 mg Oral QHS PRN Lindon Romp A, NP   25 mg at 06/04/17 2102  . haloperidol (HALDOL) tablet 5 mg  5 mg Oral TID Pennelope Bracken, MD   5 mg at 06/05/17 1214  . haloperidol decanoate (HALDOL DECANOATE) 100 MG/ML injection 150 mg  150 mg Intramuscular Q30 days Pennelope Bracken, MD   150 mg at 05/31/17 1213  . LORazepam (ATIVAN) tablet 1 mg  1 mg Oral Q6H PRN Niel Hummer, NP   1 mg at 06/05/17 3154  . magnesium hydroxide (MILK OF MAGNESIA) suspension 30 mL  30 mL Oral Daily PRN Elmarie Shiley A, NP      . ondansetron [facility]) tablet 4 mg  4 mg Oral Q8H PRN Elmarie Shiley A, NP      . ziprasidone (GEODON) injection 10 mg  10 mg Intramuscular Q12H PRN Elmarie Shiley A, NP   10 mg at 06/05/17 0630    Lab Results: No results found for this or any previous visit (from the past 44 hour(s)).  Blood Alcohol level:  Lab Results  Component Value Date   [facility] <5 05/23/2017   ETH <5 00/86/7619    Metabolic Disorder Labs: Lab Results  Component Value Date   HGBA1C 4.6 (L) 04/05/2017   MPG 85.32 04/05/2017   Lab Results  Component Value Date   PROLACTIN 50.6 (H) 04/05/2017   Lab Results  Component Value Date   CHOL 130 04/05/2017   TRIG 51 04/05/2017   HDL 57 04/05/2017   CHOLHDL 2.3 04/05/2017   VLDL 10 04/05/2017   LDLCALC 63 04/05/2017   LDLCALC 89 05/03/2012    Physical Findings: AIMS: Facial and Oral Movements Muscles of Facial Expression: None, normal Lips and Perioral Area: None, normal Jaw: None, normal Tongue: None, normal,Extremity Movements Upper (arms, wrists, hands, fingers): None, normal Lower (legs, knees, ankles, toes): None, normal, Trunk Movements Neck, shoulders, hips: None, normal, Overall Severity Severity of abnormal movements (highest score from questions above): None, normal Incapacitation due to abnormal movements: None,  normal Patient's awareness of abnormal movements (rate only patient's report): No Awareness, Dental Status Current problems with teeth and/or dentures?: No Does patient usually wear dentures?: No  CIWA:    COWS:     Musculoskeletal: Strength & Muscle Tone: within normal limits Gait & Station: normal Patient leans: N/A  Psychiatric Specialty Exam: Physical Exam  Nursing note and vitals reviewed.   Review of Systems  Constitutional: Negative for chills and fever.  Respiratory: Negative for cough.   Cardiovascular: Negative for chest pain.  Gastrointestinal: Negative for heartburn and nausea.  Psychiatric/Behavioral: Positive for memory loss. The patient is nervous/anxious.     Blood pressure 109/80, pulse (!) 119, temperature 98 F (36.7 C), resp. rate 20, height 5\' 3"  (1.6 m), weight 73.9 kg (163 lb), SpO2 100 %.Body mass index is 28.87 kg/m.  General Appearance: Casual and Fairly Groomed  Eye Contact:  Good  Speech:  Garbled and Pressured  Volume:  Normal  Mood:  Anxious  Affect:  Appropriate, Congruent and Flat  Thought Process:  Disorganized and Descriptions of Associations: Loose  Orientation:  Other:  only oriented to self and year  Thought Content:  Illogical and Delusions  Suicidal Thoughts:  No  Homicidal Thoughts:  No  Memory:  Immediate;   Poor Recent;   Poor Remote;   Poor  Judgement:  Impaired  Insight:  Lacking  Psychomotor Activity:  Normal  Concentration:  Concentration: Good  Recall:  [place] of Knowledge:  Fair  Language:  Fair  Akathisia:  No  Handed:    AIMS (if indicated):     Assets:  Communication Skills Resilience Social Support Talents/Skills  ADL's:  Intact  Cognition:  Impaired,  Moderate  Sleep:  Number of Hours: 4.5     Treatment Plan Summary: Daily contact with patient to assess and evaluate symptoms and progress in treatment and Medication management. Pt continues to have delusions and episodes of agitation. She attempts to  elope, but she is redirectable. She is only oriented to self and year, and she will require placement at facility with ability to care for those with cognitive disorders/dementia.  - Continue inpatient hospitalization  - Schizophrenia - Continuehaldol 5mg  TID    - Start haldol 5mg  po q8h prn agitation (or geodon 20mg  IM q12h prn agitation if pt refuses oral medications) - Continue haldol decanoate 150mg  IM q30 Days (last given11/30/18) - Continue abilify 20mg  qDay - continue cogentin 0.5mg  BID - Continue sinequan 25mg  qhs prn insomnia  - Continue depakote 500mg  BID  - Dementia with behavioral disturbance r/o Alzhemier's versus cognitive disorder unspecified             - SW team researching placement options  -encourage participation in groups and therapeutic milieu -Discharge planning will be ongoing    Pennelope Bracken, MD 06/05/2017, 12:56 PM

## 2017-06-05 NOTE — Tx Team (Signed)
Interdisciplinary Treatment and Diagnostic Plan Update  06/05/2017 Time of Session: 5:23 PM  [name] Ball MRN: [id number]  Principal Diagnosis: Schizophrenia [facility])  Secondary Diagnoses: Principal Problem:   Schizophrenia ([place]) Active Problems:   Dementia with behavioral disturbance   Current Medications:  Current Facility-Administered Medications  Medication Dose Route Frequency Provider Last Rate Last Dose  . acetaminophen (TYLENOL) tablet 650 mg  650 mg Oral Q6H PRN Niel Hummer, NP      . alum & mag hydroxide-simeth (MAALOX/MYLANTA) 200-200-20 MG/5ML suspension 30 mL  30 mL Oral Q4H PRN Elmarie Shiley A, NP      . ARIPiprazole (ABILIFY) tablet 20 mg  20 mg Oral Daily Pennelope Bracken, MD   20 mg at 06/05/17 0758  . benztropine (COGENTIN) tablet 0.5 mg  0.5 mg Oral BID Elmarie Shiley A, NP   0.5 mg at 06/05/17 1703  . diltiazem (CARDIZEM CD) 24 hr capsule 120 mg  120 mg Oral Daily Elmarie Shiley A, NP   120 mg at 06/05/17 0757  . divalproex (DEPAKOTE) DR tablet 500 mg  500 mg Oral BID Elmarie Shiley A, NP   500 mg at 06/05/17 1703  . doxepin (SINEQUAN) capsule 25 mg  25 mg Oral QHS PRN Lindon Romp A, NP   25 mg at 06/04/17 2102  . haloperidol (HALDOL) tablet 5 mg  5 mg Oral TID Pennelope Bracken, MD   5 mg at 06/05/17 1703  . haloperidol decanoate (HALDOL DECANOATE) 100 MG/ML injection 150 mg  150 mg Intramuscular Q30 days Pennelope Bracken, MD   150 mg at 05/31/17 1213  . LORazepam (ATIVAN) tablet 1 mg  1 mg Oral Q6H PRN Elmarie Shiley A, NP   1 mg at 06/05/17 1338  . magnesium hydroxide (MILK OF MAGNESIA) suspension 30 mL  30 mL Oral Daily PRN Elmarie Shiley A, NP      . OLANZapine zydis (ZYPREXA) disintegrating tablet 5 mg  5 mg Oral Q8H PRN Pennelope Bracken, MD      . ondansetron [facility]) tablet 4 mg  4 mg Oral Q8H PRN Elmarie Shiley A, NP      . ziprasidone (GEODON) injection 10 mg  10 mg Intramuscular Q12H PRN Niel Hummer, NP   10 mg at 06/05/17 0630     PTA Medications: Medications Prior to Admission  Medication Sig Dispense Refill Last Dose  . ARIPiprazole (ABILIFY) 20 MG tablet Take 1 tablet (20 mg total) by mouth daily. For mood control (Patient not taking: Reported on 05/23/2017) 30 tablet 0 Not Taking at Unknown time  . benztropine (COGENTIN) 0.5 MG tablet Take 1 tablet (0.5 mg total) by mouth 2 (two) times daily. For prevention of drug induced tremors 60 tablet 0 05/23/2017 at Unknown time  . clonazePAM (KLONOPIN) 1 MG tablet Take 0.5-1 mg by mouth 3 (three) times daily. Take one-half tablet by mouth twice daily and take one tablet at bedtime   05/23/2017 at Unknown time  . diltiazem (DILACOR XR) 120 MG 24 hr capsule Take 1 capsule (120 mg total) by mouth daily. For high blood pressure 10 capsule 0 05/23/2017 at Unknown time  . divalproex (DEPAKOTE) 500 MG DR tablet Take 2 tablets (1,000 mg total) by mouth at bedtime. For mood stabilization 60 tablet 0 05/22/2017 at 2000  . haloperidol (HALDOL) 5 MG tablet Take 1 tablet (5 mg total) by mouth 2 (two) times daily. For mood control (Patient taking differently: Take 5 mg by mouth 3 (three) times daily.  For mood control) 30 tablet 0 05/23/2017 at 800a  . haloperidol decanoate (HALDOL DECANOATE) 100 MG/ML injection Inject 1.5 mLs (150 mg total) into the muscle every 30 (thirty) days. (Due on 05-06-17): For mood control 1.5 mL 0 05/07/2017 at Unknown time  . ibuprofen (ADVIL,MOTRIN) 200 MG tablet Take 1-2 tablets (200-400 mg total) by mouth every 4 (four) hours as needed for moderate pain. 1 tablet 0 unknown  . LORazepam (ATIVAN) 1 MG tablet Take 1 mg by mouth every 6 (six) hours as needed for anxiety.   unknown  . nicotine (NICODERM CQ - DOSED IN MG/24 HR) 7 mg/24hr patch Place 1 patch (7 mg total) onto the skin daily. (May purchase from over the counter): For smoking cessation 28 patch 0 unknown  . pantoprazole (PROTONIX) 20 MG tablet Take 1 tablet (20 mg total) by mouth daily. For acid reflux    05/23/2017 at Unknown time  . predniSONE (DELTASONE) 10 MG tablet Take 2 tablets (20 mg total) by mouth daily. 10 tablet 0   . QUEtiapine (SEROQUEL) 25 MG tablet Take 25 mg by mouth 3 (three) times daily.   05/23/2017 at 800a  . zolpidem (AMBIEN) 5 MG tablet Take 1 tablet (5 mg total) by mouth at bedtime as needed for sleep. 7 tablet 0 unknown    Patient Stressors: Financial difficulties Medication change or noncompliance Other: ongoing psychosis  Patient Strengths: General fund of knowledge Motivation for treatment/growth Physical Health  Treatment Modalities: Medication Management, Group therapy, Case management,  1 to 1 session with clinician, Psychoeducation, Recreational therapy.   Physician Treatment Plan for Primary Diagnosis: Schizophrenia ([place]) Long Term Goal(s): Improvement in symptoms so as ready for discharge  Short Term Goals: Ability to maintain clinical measurements within normal limits will improve Ability to demonstrate self-control will improve Ability to identify and develop effective coping behaviors will improve  Medication Management: Evaluate patient's response, side effects, and tolerance of medication regimen.  Therapeutic Interventions: 1 to 1 sessions, Unit Group sessions and Medication administration.  Evaluation of Outcomes: Progressing   12/4: Pt continues to have delusions and episodes of agitation. She attempts to elope, but she is redirectable. She is only oriented to self and year, and she will require placement at facility with ability to care for those with cognitive disorders/dementia.  - Schizophrenia - Continuehaldol 46m TID                         - Start haldol 580mpo q8h prn agitation (or geodon 2012mM q12h prn agitation if pt refuses oral medications) - Continue haldol decanoate 150m32m q30 Days (last given11/30/18) - Continue abilify 20mg70my - continue cogentin 0.5mg  29m - Continue sinequan 25mg q32mrn insomnia  - Continue depakote 500mg BI54m Physician Treatment Plan for Secondary Diagnosis: Principal Problem:   Schizophrenia (HCC) [place] Problems:   Dementia with behavioral disturbance   Long Term Goal(s): Improvement in symptoms so as ready for discharge  Short Term Goals: Ability to maintain clinical measurements within normal limits will improve Ability to demonstrate self-control will improve Ability to identify and develop effective coping behaviors will improve  Medication Management: Evaluate patient's response, side effects, and tolerance of medication regimen.  Therapeutic Interventions: 1 to 1 sessions, Unit Group sessions and Medication administration.  Evaluation of Outcomes: Progressing   RN Treatment Plan for Primary Diagnosis: Schizophrenia (HCC) [place] Goal(s): Knowledge of disease and therapeutic regimen to maintain health will improve  Short Term Goals: Ability to identify and develop effective coping behaviors will improve and Compliance with prescribed medications will improve  Medication Management: RN will administer medications as ordered by provider, will assess and evaluate patient's response and provide education to patient for prescribed medication. RN will report any adverse and/or side effects to prescribing provider.  Therapeutic Interventions: 1 on 1 counseling sessions, Psychoeducation, Medication administration, Evaluate responses to treatment, Monitor vital signs and CBGs as ordered, Perform/monitor CIWA, COWS, AIMS and Fall Risk screenings as ordered, Perform wound care treatments as ordered.  Evaluation of Outcomes: Progressing   LCSW Treatment Plan for Primary Diagnosis: Schizophrenia ([place]) Long Term Goal(s): Safe transition to appropriate next level of care at discharge, Engage patient in therapeutic group addressing interpersonal concerns.  Short Term Goals: Engage  patient in aftercare planning with referrals and resources  Therapeutic Interventions: Assess for all discharge needs, 1 to 1 time with Social worker, Explore available resources and support systems, Assess for adequacy in community support network, Educate family and significant other(s) on suicide prevention, Complete Psychosocial Assessment, Interpersonal group therapy.  Evaluation of Outcomes: Not Met  Unsure of where pt will go at d/c.  CSW to talk to Mr [facility] about taking pt back   Progress in Treatment: Attending groups: Yes Participating in groups: Yes Taking medication as prescribed: Yes Toleration medication: Yes, no side effects reported at this time Family/Significant other contact made: Yes  Guardian Patient understands diagnosis: No  Limited insight Discussing patient identified problems/goals with staff: Yes Medical problems stabilized or resolved: Yes Denies suicidal/homicidal ideation: Yes Issues/concerns per patient self-inventory: None Other: N/A  New problem(s) identified: None identified at this time.   New Short Term/Long Term Goal(s): None identified at this time.   Discharge Plan or Barriers:   Reason for Continuation of Hospitalization: Disorganization Delusions  Medication stabilization   Estimated Length of Stay: 12/10  Attendees: Patient: 06/05/2017  5:23 PM  Physician: Maris Berger, MD 06/05/2017  5:23 PM  Nursing: Sena Hitch, RN 06/05/2017  5:23 PM  RN Care Manager: Lars Pinks, RN 06/05/2017  5:23 PM  Social Worker: Ripley Fraise 06/05/2017  5:23 PM  Recreational Therapist: Winfield Cunas 06/05/2017  5:23 PM  Other: Norberto Sorenson 06/05/2017  5:23 PM  Other:  06/05/2017  5:23 PM    Scribe for Treatment Team:  Roque Lias LCSW 06/05/2017 5:23 PM

## 2017-06-05 NOTE — Progress Notes (Signed)
1:1 Note Pt continues to be very confused, not following directions, going into other patients rooms. Pt continues to barricade the door and combative with staff when being redirected. Geodon 10 mg IM given for agitation. Will continue to monitor.

## 2017-06-06 MED ORDER — LORAZEPAM 2 MG/ML IJ SOLN
2.0000 mg | Freq: Once | INTRAMUSCULAR | Status: AC
  Administered 2017-06-06: 2 mg via INTRAMUSCULAR

## 2017-06-06 MED ORDER — OLANZAPINE 10 MG PO TBDP
10.0000 mg | ORAL_TABLET | Freq: Every day | ORAL | Status: DC
  Filled 2017-06-06 (×2): qty 1

## 2017-06-06 MED ORDER — CHLORPROMAZINE HCL 25 MG/ML IJ SOLN: 25.0000 mg | Freq: Once | INTRAMUSCULAR | Status: DC | PRN

## 2017-06-06 MED ORDER — LORAZEPAM 2 MG/ML IJ SOLN
2.0000 mg | Freq: Once | INTRAMUSCULAR | Status: AC
  Administered 2017-06-06: 2 mg via INTRAMUSCULAR
  Filled 2017-06-06: qty 1

## 2017-06-06 MED ORDER — HALOPERIDOL LACTATE 5 MG/ML IJ SOLN
INTRAMUSCULAR | Status: AC
  Administered 2017-06-06: 5 mg via INTRAMUSCULAR
  Filled 2017-06-06: qty 1

## 2017-06-06 MED ORDER — OLANZAPINE 5 MG PO TBDP
2.5000 mg | ORAL_TABLET | ORAL | Status: DC
  Administered 2017-06-07: 2.5 mg via ORAL
  Filled 2017-06-06 (×2): qty 0.5

## 2017-06-06 MED ORDER — HALOPERIDOL LACTATE 5 MG/ML IJ SOLN
5.0000 mg | Freq: Once | INTRAMUSCULAR | Status: AC
  Administered 2017-06-06: 5 mg via INTRAMUSCULAR
  Filled 2017-06-06: qty 1

## 2017-06-06 MED ORDER — LORAZEPAM 2 MG/ML IJ SOLN
INTRAMUSCULAR | Status: AC
  Administered 2017-06-06: 2 mg via INTRAMUSCULAR
  Filled 2017-06-06: qty 1

## 2017-06-06 NOTE — Progress Notes (Signed)
1:1 Nursing Progress Note  D: Patient is observed sleeping in bed. Respirations are even & unlabored. Environment is secured. Sitter observed with patient per [employer].  A: Patient remains on 1:1 observation per provider orders. Moderate fall risk precautions in place. Patient safety monitored with q15 minute safety checks.  R: Patient remains safe on the unit at this time. Will continue to monitor with 1:1 sitter, q15 min safety checks & q4 hour nursing assessments.

## 2017-06-06 NOTE — Progress Notes (Signed)
1:1 Nursing Progress Note  D: Patient remains awake and continues to report she needs to "find her niece" and insists "my baby is outside we need to check on her". Patient emotionally supported. Sitter redirecting patient back into bed. Sitter reports patient not sleeping much more than a few minutes at a time. Environment secured. Sitter with patient per [employer].  A: Patient remains on 1:1 observation per provider orders. Moderate fall risk precautions in place. Patient safety monitored with q15 minute safety checks.  R: Patient remains safe on the unit at this time. Will continue to monitor with 1:1 sitter, q15 min safety checks & q4 hour nursing assessments.

## 2017-06-06 NOTE — Progress Notes (Signed)
Patient attempting to elope from unit. Patient threatening staff, hitting staff. Patient continued to hit staff, two-arm escort to quiet room. Patient assisted to seated-position  on floor in quiet room. PRN medications administered. Patient then ambulated to patient room. Patient monitored 1:1 for thirty minutes post manual hold. No distress noted. Will continue to monitor. Patient continues to have labile and irritable behavior.

## 2017-06-06 NOTE — Progress Notes (Signed)
D: observed to be restless, pacing, stripping off her clothes in front of assigned 1:1 staff this AM, walking down the hall naked. Incontinent of urine at this time. Combative with staff when approached to change wet scrubs. Unable to move away from unit entry door. Required multiple verbal redirections but to no avail. Took her medications when offered. Intrusive towards staff and peers.  A: All medications given as per MD's orders. Compliance with treatment regimen encouraged. Emotional support offered. 1:1 observation continues as ordered, assigned MHT staff in attendance at all times. R: Pt has made attempt to elope off unit. Pt is confused, observed looking for her children under the table in the dayroom. POC continue for safety and mood stability.

## 2017-06-06 NOTE — Progress Notes (Signed)
[place] [facility] 1:1 Observation Documentation  For the first (8) hours following discontinuation of 1:1 precautions, a progress note entry by nursing staff should be documented at least every 2 hours, reflecting the patient's behavior, condition, mood, and conversation.  Use the progress notes for additional entries.  Time 1:1 discontinued:  0929  Patient's Behavior:  Combative, unable to be redirected at this time. Standing at double door, refused to move away. Continues to strip her clothes. Sexually inappropriate towards female staff.   Patient's Condition:  Confused, irritable, labile and restless.   Patient's Conversation:  I want to go home, I want to see my children, they're outside waiting for me"  [place] [employer] 06/06/2017, 1129

## 2017-06-06 NOTE — Progress Notes (Signed)
Did not attend group 

## 2017-06-06 NOTE — Progress Notes (Signed)
Patient has been awakened by peer. Patient is agitated and not following verbal redirection of sitter. Patient is up in the dayroom stating, "my baby is in there, I want my baby". Patient does not have a baby. Patient refusing to return to her room. Patient up at front doors banging on them and yelling. PA Spencer notified and new orders received. Show of force by staff arrived, but patient agreeable to taking IM orders as prescribed. Injections provided by Probation officer and RN [place]. Patient refused to stay in room after receiving medication and returned to nurses's station. Patient attempting to enter nurses station. Patient directed back to her room and provided fluids. Patient resting in her room with sitter at bedside.

## 2017-06-06 NOTE — Progress Notes (Signed)
[place] [facility] 1:1 Observation Documentation  For the first (8) hours following discontinuation of 1:1 precautions, a progress note entry by nursing staff should be documented at least every 2 hours, reflecting the patient's behavior, condition, mood, and conversation.  Use the progress notes for additional entries.  Time 1:1 discontinued:  0929  Patient's Behavior:  Pt remains combative, unable to be redirected away from the door.   Patient's Condition:  Confused / disorganized and labile. Continue to need verbal redirections.    Patient's Conversation:  "Call my baby, I want to go home now".  [place] [employer] 06/06/2017, 1329

## 2017-06-06 NOTE — Progress Notes (Signed)
[place] [facility] 1:1 Observation Documentation  For the first (8) hours following discontinuation of 1:1 precautions, a progress note entry by nursing staff should be documented at least every 2 hours, reflecting the patient's behavior, condition, mood, and conversation.  Use the progress notes for additional entries.  Time 1:1 discontinued:  1129  Patient's Behavior:  Pt asleep at this time.   Patient's Condition:  Calm at present. Respirations noted and unlabored.  Patient's Conversation:  Pt asleep.   [place] Police 06/06/2017, 1729

## 2017-06-06 NOTE — Progress Notes (Signed)
[facility] 1:1 Observation Documentation  For the first (8) hours following discontinuation of 1:1 precautions, a progress note entry by nursing staff should be documented at least every 2 hours, reflecting the patient's behavior, condition, mood, and conversation.  Use the progress notes for additional entries.  Time 1:1 discontinued:  0929  Patient's Behavior:  Pt stripped off her clothes at approximately 1510, observed walking naked in hall. Refused to go back to room when prompted, combative with staff. Verbally abusive as well. Resisting care. Demanding to leave unit. Resistive towards care at this time.  Patient's Condition:  Restless and labile.   Patient's Conversation:  "No, I want to leave now, let me go" when staff attempted to help her dress up.   [place] [employer] 06/06/2017, 1529

## 2017-06-06 NOTE — Progress Notes (Signed)
Patient slept 1.25 hours last night per review of q15 minute check sheet and MHT.

## 2017-06-06 NOTE — Progress Notes (Signed)
Recreation Therapy Notes  Date: 06/06/17 Time: 1000 Location: 500 Hall Dayroom  Group Topic: Self-Esteem  Goal Area(s) Addresses:  Patient will successfully identify positive attributes about themselves.  Patient will successfully identify benefit of improved self-esteem.   Intervention: Visual merchandiser, markers  Activity: Naval architect.  Patients were to create an advertisement that describes and highlights the positive traits and qualities about themselves.  Education: Self-Esteem, Dentist.   Education Outcome: Acknowledges education/In group clarification offered/Needs additional education  Clinical Observations/Feedback: Pt did not attend group.    Victorino Sparrow, LRT/CTRS         Victorino Sparrow A 06/06/2017 12:06 PM

## 2017-06-06 NOTE — Progress Notes (Signed)
Patient has been pacing the hallways and attempting to enter other patient's rooms. Patient verbally redirected by staff. Patient attempting to enter nurse's station and continues to barricade unit exits.

## 2017-06-06 NOTE — Progress Notes (Signed)
1:1 Nursing Progress Note  D: Patient presents with confusion and is disorganized. Patient has been at the front doors several times this evening requiring verbal redirection by her sitter and Probation officer. Patient provided with PRN medication for agitation. Patient provided extra tray per request. Environment is secured. Sitter observed with patient per [employer]. Patient asks Probation officer several times this evening, "just buy me some cigarettes will ya? I'll pay you back in the morning".  A: Patient remains on 1:1 observation per provider orders. Moderate fall risk precautions in place. Patient safety monitored with q15 minute safety checks.  R: Patient remains safe on the unit at this time. Will continue to monitor with 1:1 sitter, q15 min safety checks & q4 hour nursing assessments.

## 2017-06-06 NOTE — Progress Notes (Signed)
[facility] MD Progress Note  06/06/2017 12:51 PM [name] Ball  MRN:  [id number] Subjective:   [name] Ball is a 51 y/o F with history of schizophrenia vs bipolar with psychotic features who was admitted initially to ED with worsening symptoms of psychosis including inability to take care of herself, hallucinations, and wandering off from her group home.She was restarted on medications including haldol and abilify, and she was transferred to [facility] for further stabilization. Pt has been having ongoing episodes of agitation since her admission, and she requires frequent redirection. She has not been oriented and she continues to have delusions that she has multiple children in the building that she wants to see. Due to her behaviors of attempting to elope, pt was placed on 1:1.  Today upon evaluation, pt continued to struggle with agitation and disorganized behaviors. This AM she was disrobing and sexually inappropriate with female staff, but she was able to be redirected. At time of evaluation, pt is labile and tearful as she is requesting to see her daughter. She exclaims, "Why can't I see my daughter?" Pt was redirected that she would need to remain on the unit until we were able to find placement for her. She denies SI/HI/AH/VH. With reassurance that we are attempting to place her in a group home, pt was able to calm somewhat, but she continued to require multiple redirections by staff. Discussed with patient that we will discontinue abilify and instead focus on using zyprexa to assist in reducing agitation. Pt was in agreement with this plan, and she had no further questions, comments, or concerns.    Principal Problem: Schizophrenia ([place]) Diagnosis:   Patient Active Problem List   Diagnosis Date Noted  . Dementia with behavioral disturbance [F03.91] 06/04/2017  . Schizophrenia ([place]) [F20.9] 05/29/2017  . Bipolar disorder, current episode manic, severe with psychotic features ([place]) [F31.2] 04/03/2017   . Hepatitis C virus infection without hepatic coma [B19.20]   . Bipolar disorder ([place]) [F31.9]   . GERD (gastroesophageal reflux disease) [K21.9]    Total Time spent with patient: 30 minutes  Past Psychiatric History: see H&P  Past Medical History:  Past Medical History:  Diagnosis Date  . Bipolar disorder ([place])   . GERD (gastroesophageal reflux disease)   . Hepatitis C virus infection without hepatic coma     Past Surgical History:  Procedure Laterality Date  . abdominal cyst removed    . CESAREAN SECTION    . COLON SURGERY     Family History: History reviewed. No pertinent family history. Family Psychiatric  History: see H&P Social History:  Social History   Substance and Sexual Activity  Alcohol Use No     Social History   Substance and Sexual Activity  Drug Use No    Social History   Socioeconomic History  . Marital status: Unknown    Spouse name: None  . Number of children: None  . Years of education: None  . Highest education level: None  Social Needs  . Financial resource strain: None  . Food insecurity - worry: None  . Food insecurity - inability: None  . Transportation needs - medical: None  . Transportation needs - non-medical: None  Occupational History  . None  Tobacco Use  . Smoking status: Current Every Day Smoker    Packs/day: 1.00    Types: Cigarettes  . Smokeless tobacco: Never Used  Substance and Sexual Activity  . Alcohol use: No  . Drug use: No  . Sexual activity: None  Other Topics Concern  . None  Social History Narrative  . None   Additional Social History:                         Sleep: Fair  Appetite:  Fair  Current Medications: Current Facility-Administered Medications  Medication Dose Route Frequency Provider Last Rate Last Dose  . acetaminophen (TYLENOL) tablet 650 mg  650 mg Oral Q6H PRN Niel Hummer, NP      . alum & mag hydroxide-simeth (MAALOX/MYLANTA) 200-200-20 MG/5ML suspension 30 mL  30 mL  Oral Q4H PRN Elmarie Shiley A, NP      . benztropine (COGENTIN) tablet 0.5 mg  0.5 mg Oral BID Elmarie Shiley A, NP   0.5 mg at 06/06/17 4580  . diltiazem (CARDIZEM CD) 24 hr capsule 120 mg  120 mg Oral Daily Elmarie Shiley A, NP   120 mg at 06/06/17 9983  . divalproex (DEPAKOTE) DR tablet 500 mg  500 mg Oral BID Elmarie Shiley A, NP   500 mg at 06/06/17 0813  . haloperidol (HALDOL) tablet 5 mg  5 mg Oral TID Pennelope Bracken, MD   5 mg at 06/06/17 1131  . haloperidol decanoate (HALDOL DECANOATE) 100 MG/ML injection 150 mg  150 mg Intramuscular Q30 days Pennelope Bracken, MD   150 mg at 05/31/17 1213  . LORazepam (ATIVAN) tablet 1 mg  1 mg Oral Q6H PRN Niel Hummer, NP   1 mg at 06/05/17 1957  . magnesium hydroxide (MILK OF MAGNESIA) suspension 30 mL  30 mL Oral Daily PRN Niel Hummer, NP      . Derrill Memo ON 06/07/2017] OLANZapine zydis (ZYPREXA) disintegrating tablet 2.5 mg  2.5 mg Oral Rockne Menghini, MD       And  . OLANZapine zydis (ZYPREXA) disintegrating tablet 10 mg  10 mg Oral QHS Maris Berger T, MD      . OLANZapine zydis (ZYPREXA) disintegrating tablet 5 mg  5 mg Oral Q8H PRN Pennelope Bracken, MD   5 mg at 06/06/17 1015  . ondansetron (ZOFRAN) tablet 4 mg  4 mg Oral Q8H PRN Elmarie Shiley A, NP      . ziprasidone (GEODON) injection 10 mg  10 mg Intramuscular Q12H PRN Elmarie Shiley A, NP   10 mg at 06/06/17 0501    Lab Results: No results found for this or any previous visit (from the past 23 hour(s)).  Blood Alcohol level:  Lab Results  Component Value Date   [facility] <5 05/23/2017   ETH <5 38/25/0539    Metabolic Disorder Labs: Lab Results  Component Value Date   HGBA1C 4.6 (L) 04/05/2017   MPG 85.32 04/05/2017   Lab Results  Component Value Date   PROLACTIN 50.6 (H) 04/05/2017   Lab Results  Component Value Date   CHOL 130 04/05/2017   TRIG 51 04/05/2017   HDL 57 04/05/2017   CHOLHDL 2.3 04/05/2017   VLDL 10 04/05/2017   LDLCALC  63 04/05/2017   LDLCALC 89 05/03/2012    Physical Findings: AIMS: Facial and Oral Movements Muscles of Facial Expression: None, normal Lips and Perioral Area: None, normal Jaw: None, normal Tongue: None, normal,Extremity Movements Upper (arms, wrists, hands, fingers): None, normal Lower (legs, knees, ankles, toes): None, normal, Trunk Movements Neck, shoulders, hips: None, normal, Overall Severity Severity of abnormal movements (highest score from questions above): None, normal Incapacitation due to abnormal movements: None, normal Patient's awareness of abnormal  movements (rate only patient's report): No Awareness, Dental Status Current problems with teeth and/or dentures?: No Does patient usually wear dentures?: No  CIWA:    COWS:     Musculoskeletal: Strength & Muscle Tone: within normal limits Gait & Station: normal Patient leans: N/A  Psychiatric Specialty Exam: Physical Exam  Nursing note and vitals reviewed.   Review of Systems  Constitutional: Negative for chills and fever.  Respiratory: Negative for cough and shortness of breath.   Cardiovascular: Negative for chest pain.  Gastrointestinal: Negative for heartburn and nausea.  Psychiatric/Behavioral: Positive for hallucinations. Negative for depression and suicidal ideas.    Blood pressure 109/80, pulse (!) 119, temperature 98 F (36.7 C), resp. rate 20, height 5\' 3"  (1.6 m), weight 73.9 kg (163 lb), SpO2 100 %.Body mass index is 28.87 kg/m.  General Appearance: Casual and Fairly Groomed  Eye Contact:  Good  Speech:  Garbled and Pressured  Volume:  Increased  Mood:  Anxious, Dysphoric and Irritable  Affect:  Blunt and Labile  Thought Process:  Disorganized and Descriptions of Associations: Loose  Orientation:  Other:  only oriented to self and year  Thought Content:  Hallucinations: Visual and Ideas of Reference:   Delusions  Suicidal Thoughts:  No  Homicidal Thoughts:  No  Memory:  Immediate;    Poor Recent;   Poor Remote;   Poor  Judgement:  Impaired  Insight:  Lacking  Psychomotor Activity:  Normal  Concentration:  Concentration: Poor  Recall:  Poor  Fund of Knowledge:  Fair  Language:  Fair  Akathisia:  No  Handed:    AIMS (if indicated):     Assets:  Armed forces logistics/support/administrative officer Physical Health Resilience Social Support  ADL's:  Intact  Cognition:  WNL  Sleep:  Number of Hours: 1.25     Treatment Plan Summary: Daily contact with patient to assess and evaluate symptoms and progress in treatment and Medication management. Pt continues to have delusions and episodes of agitation. She requires frequent redirection from staff. We will discontinue abilify and change to scheduled zyprexa (with additional zyprexa available as needed for treatment of episodes of agitation).  - Continue inpatient hospitalization  - Schizophrenia - Continuehaldol 5mg  TID             - Continue zyprexa 5mg  q8h prn agitation (or geodon 20mg  IM q12h prn agitation if pt refuses oral medications)   - Start zyprexa 2.5mg  qAM + 10mg  qhs - Continue haldol decanoate 150mg  IM q30 Days (last given11/30/18) - Discontinue abilify 20mg  qDay - continue cogentin 0.5mg  BID - Discontinue sinequan 25mg  qhs prn insomnia  - Continue depakote 500mg  BID     - depakote level tonight  - Dementia with behavioral disturbance r/o Alzhemier's versus cognitive disorder unspecified - SW team researching placement options  -encourage participation in groups and therapeutic milieu -Discharge planning will be ongoing     Pennelope Bracken, MD 06/06/2017, 12:51 PM

## 2017-06-06 NOTE — Progress Notes (Signed)
Nursing Progress Note [id number]  D) Patient remains confused, delusional and is not oriented to place or situation. Patient continues to report this is her home and not the hospital. Patient continues to look for "her baby". Patient often barricading the unit exit and attempting to enter other patient's rooms. At times patient is redirectable, other times patient becomes aggressive and combative. Patient repeatedly asks Probation officer for "her cigarettes".  Patient denies SI/HI/AVH or pain. Patient contracts for safety on the unit. Patient provided with scheduled medications and PRN orders as indicated.  A) Emotional support given. 1:1 interaction and active listening provided. Patient medicated as prescribed. Medications and plan of care reviewed with patient. Patient verbalized understanding without further questions. Snacks and fluids provided. Opportunities for questions or concerns presented to patient. Patient encouraged to continue to work on treatment goals. Labs, vital signs and patient behavior monitored throughout shift. Patient safety maintained with q15 min safety checks. Moderate fall risk precautions in place and reviewed with patient; patient verbalized understanding.  R) Patient remains safe on the unit at this time. Will continue to support, redirect and monitor.

## 2017-06-06 NOTE — Progress Notes (Signed)
Patient up at nurse's station not following direction of sitter. Patient takes a crayon and tries to put it in her mouth. Patient states, "don't take my cigarettes, I'm smoking". Patient agreeable to taking PO medications. Patient offered PRN ativan and PRN zyprexa. Patient dumped the pills in her cup and refused to take them. PA Spencer notified. Verbal order to give PRN geodon early. Patient agreeable to IM geodon. Medication administered by RN Erlene Quan C. Patient needing redirection to her room by sitter, MHT, and RN. Will continue to support and monitor as needed.

## 2017-06-07 ENCOUNTER — Other Ambulatory Visit: Payer: Self-pay

## 2017-06-07 MED ORDER — DIPHENHYDRAMINE HCL 25 MG PO CAPS
50.0000 mg | ORAL_CAPSULE | Freq: Three times a day (TID) | ORAL | Status: DC | PRN
  Administered 2017-06-07 – 2017-06-09 (×3): 50 mg via ORAL
  Filled 2017-06-07 (×3): qty 2

## 2017-06-07 MED ORDER — OLANZAPINE 10 MG PO TBDP
10.0000 mg | ORAL_TABLET | Freq: Every day | ORAL | Status: DC
  Administered 2017-06-07 – 2017-06-10 (×4): 10 mg via ORAL
  Filled 2017-06-07 (×6): qty 1

## 2017-06-07 MED ORDER — OLANZAPINE 5 MG PO TBDP
5.0000 mg | ORAL_TABLET | Freq: Two times a day (BID) | ORAL | Status: DC
  Administered 2017-06-07 – 2017-06-11 (×8): 5 mg via ORAL
  Filled 2017-06-07 (×11): qty 1

## 2017-06-07 NOTE — Progress Notes (Signed)
Patient has required much redirection this shift and the support of multiple prn medications including 10 mg of geodon IM due to aggressive behaviors.  Patient has disrobed several times and defecated on herself requiring assistance to clean dress herself.   Patient has been standing by the door attempting to elope from the unit.  Patient walked into the day room completely nude and attempted to sit down.  Patient is disorganized, has nonsensical speech and needs continuous redirection.   Redirect patient, assist patient in maintaining safety, offer medications as needed.   Continue to monitor patient as planned.

## 2017-06-07 NOTE — Progress Notes (Signed)
Pt constantly needing redirection, pt causing other patients to act up on the unit due to pt constant visual disruptions.

## 2017-06-07 NOTE — Progress Notes (Signed)
Pt constantly up in the milieu , going to the door trying to get out. Pt stayed in front of the door not allowing staff to come in , pt constantly going in and out of other other patients room. Pt will go from thinking it is different times in History, pt will confabulate when she does not know something and when it does not make sense pt gets upset.

## 2017-06-07 NOTE — Progress Notes (Signed)
[facility] MD Progress Note  06/07/2017 11:12 AM [name] Ball  MRN:  [id number] Subjective:   [name] Ball is a 51 y/o F with history of schizophrenia vs bipolar with psychotic features who was admitted initially to ED with worsening symptoms of psychosis including inability to take care of herself, hallucinations, and wandering off from her group home.She was restarted on medications including haldol and abilify, and she was transferred to [facility] for further stabilization. Pt continues to have episodes of agitation including wandering into other patients' rooms and disrobing at times, and she has required frequent redirection. She received multiple PRN medications to manage her agitation in the last 24 hours. She has not been oriented and she continues to have delusions about leaving to see her children.  Today upon evaluation, pt is disorganized and attempting to elope through the doors off of unit, but she is able to be redirected. She denies SI/HI/AH/VH, but she notes about seeing her children in the courtyard of the building despite there is nobody visible in the courtyard from the window. 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 She states her current city is "[place]" and the month is "September." She is unable to state the year. Discussed with patient that we will change her zyprexa to three times per day dosing to better address her symptoms of agitation, and pt verbalized understanding. She had no further questions, comments, or concerns.     Principal Problem: Schizophrenia ([place]) Diagnosis:   Patient Active Problem List   Diagnosis Date Noted  . Dementia with behavioral disturbance [F03.91] 06/04/2017  . Schizophrenia ([place]) [F20.9] 05/29/2017  . Bipolar disorder, current episode manic, severe with psychotic features ([place])  [F31.2] 04/03/2017  . Hepatitis C virus infection without hepatic coma [B19.20]   . Bipolar disorder ([place]) [F31.9]   . GERD (gastroesophageal reflux disease) [K21.9]    Total Time spent with patient: 30 minutes  Past Psychiatric History: see H&P  Past Medical History:  Past Medical History:  Diagnosis Date  . Bipolar disorder ([place])   . GERD (gastroesophageal reflux disease)   . Hepatitis C virus infection without hepatic coma     Past Surgical History:  Procedure Laterality Date  . abdominal cyst removed    . CESAREAN SECTION    . COLON SURGERY     Family History: History reviewed. No pertinent family history. Family Psychiatric  History: see H&P Social History:  Social History   Substance and Sexual Activity  Alcohol Use No     Social History   Substance and Sexual Activity  Drug Use No    Social History   Socioeconomic History  . Marital status: Unknown    Spouse name: None  . Number of children: None  . Years of education: None  . Highest education level: None  Social Needs  . Financial resource strain: None  . Food insecurity - worry: None  . Food insecurity - inability: None  . Transportation needs - medical: None  . Transportation needs - non-medical: None  Occupational History  . None  Tobacco Use  . Smoking status: Current Every Day Smoker    Packs/day: 1.00    Types: Cigarettes  . Smokeless tobacco: Never Used  Substance and Sexual Activity  . Alcohol use: No  . Drug use: No  .  Sexual activity: None  Other Topics Concern  . None  Social History Narrative  . None   Additional Social History:                         Sleep: Fair  Appetite:  Fair  Current Medications: Current Facility-Administered Medications  Medication Dose Route Frequency Provider Last Rate Last Dose  . acetaminophen (TYLENOL) tablet 650 mg  650 mg Oral Q6H PRN Niel Hummer, NP      . alum & mag hydroxide-simeth (MAALOX/MYLANTA) 200-200-20 MG/5ML  suspension 30 mL  30 mL Oral Q4H PRN Elmarie Shiley A, NP      . benztropine (COGENTIN) tablet 0.5 mg  0.5 mg Oral BID Elmarie Shiley A, NP   0.5 mg at 06/07/17 0735  . diltiazem (CARDIZEM CD) 24 hr capsule 120 mg  120 mg Oral Daily Elmarie Shiley A, NP   120 mg at 06/07/17 0735  . divalproex (DEPAKOTE) DR tablet 500 mg  500 mg Oral BID Elmarie Shiley A, NP   500 mg at 06/07/17 7106  . haloperidol (HALDOL) tablet 5 mg  5 mg Oral TID Pennelope Bracken, MD   5 mg at 06/07/17 0735  . haloperidol decanoate (HALDOL DECANOATE) 100 MG/ML injection 150 mg  150 mg Intramuscular Q30 days Pennelope Bracken, MD   150 mg at 05/31/17 1213  . LORazepam (ATIVAN) tablet 1 mg  1 mg Oral Q6H PRN Elmarie Shiley A, NP   1 mg at 06/07/17 0737  . magnesium hydroxide (MILK OF MAGNESIA) suspension 30 mL  30 mL Oral Daily PRN Elmarie Shiley A, NP      . OLANZapine zydis (ZYPREXA) disintegrating tablet 2.5 mg  2.5 mg Oral Rockne Menghini, MD   2.5 mg at 06/07/17 0736   And  . OLANZapine zydis (ZYPREXA) disintegrating tablet 10 mg  10 mg Oral QHS Maris Berger T, MD      . OLANZapine zydis (ZYPREXA) disintegrating tablet 5 mg  5 mg Oral Q8H PRN Pennelope Bracken, MD   5 mg at 06/07/17 0738  . ondansetron (ZOFRAN) tablet 4 mg  4 mg Oral Q8H PRN Elmarie Shiley A, NP      . ziprasidone (GEODON) injection 10 mg  10 mg Intramuscular Q12H PRN Elmarie Shiley A, NP   10 mg at 06/07/17 0245    Lab Results: No results found for this or any previous visit (from the past 48 hour(s)).  Blood Alcohol level:  Lab Results  Component Value Date   [facility] <5 05/23/2017   ETH <5 26/94/8546    Metabolic Disorder Labs: Lab Results  Component Value Date   HGBA1C 4.6 (L) 04/05/2017   MPG 85.32 04/05/2017   Lab Results  Component Value Date   PROLACTIN 50.6 (H) 04/05/2017   Lab Results  Component Value Date   CHOL 130 04/05/2017   TRIG 51 04/05/2017   HDL 57 04/05/2017   CHOLHDL 2.3 04/05/2017   VLDL  10 04/05/2017   LDLCALC 63 04/05/2017   LDLCALC 89 05/03/2012    Physical Findings: AIMS: Facial and Oral Movements Muscles of Facial Expression: None, normal Lips and Perioral Area: None, normal Jaw: None, normal Tongue: None, normal,Extremity Movements Upper (arms, wrists, hands, fingers): None, normal Lower (legs, knees, ankles, toes): None, normal, Trunk Movements Neck, shoulders, hips: None, normal, Overall Severity Severity of abnormal movements (highest score from questions above): None, normal Incapacitation due to abnormal movements: None, normal  Patient's awareness of abnormal movements (rate only patient's report): No Awareness, Dental Status Current problems with teeth and/or dentures?: No Does patient usually wear dentures?: No  CIWA:    COWS:     Musculoskeletal: Strength & Muscle Tone: within normal limits Gait & Station: normal Patient leans: N/A  Psychiatric Specialty Exam: Physical Exam  Nursing note and vitals reviewed.   Review of Systems  Constitutional: Negative for chills and fever.  Respiratory: Negative for cough and shortness of breath.   Cardiovascular: Negative for chest pain.  Gastrointestinal: Negative for heartburn.  Psychiatric/Behavioral: Positive for hallucinations and memory loss. Negative for depression and suicidal ideas. The patient is nervous/anxious and has insomnia.     Blood pressure 112/76, pulse (!) 112, temperature 97.8 F (36.6 C), temperature source Oral, resp. rate 18, height 5\' 3"  (1.6 m), weight 73.9 kg (163 lb), SpO2 99 %.Body mass index is 28.87 kg/m.  General Appearance: Casual and Disheveled  Eye Contact:  Good  Speech:  Garbled  Volume:  Decreased  Mood:  Anxious and Euthymic  Affect:  Blunt, Flat and Labile  Thought Process:  Disorganized and Descriptions of Associations: Loose  Orientation:  Other:  not oriented to city, or date (unable to name month or year)  Thought Content:  Illogical, Delusions,  Hallucinations: Visual and Ideas of Reference:   Delusions  Suicidal Thoughts:  No  Homicidal Thoughts:  No  Memory:  Immediate;   Poor Recent;   Poor Remote;   Poor  Judgement:  Poor  Insight:  Lacking  Psychomotor Activity:  Normal  Concentration:  Concentration: Poor  Recall:  Poor  Fund of Knowledge:  Poor  Language:  Poor  Akathisia:  No  Handed:    AIMS (if indicated):     Assets:  Resilience  ADL's:  Impaired  Cognition:  Impaired,  Severe  Sleep:  Number of Hours: 1.25     Treatment Plan Summary: Daily contact with patient to assess and evaluate symptoms and progress in treatment and Medication management   - Continue inpatient hospitalization  - Schizophrenia - Continuehaldol 5mg  TID - Continue zyprexa 5mg  q8h prn agitation (or geodon 20mg  IM q12h prn agitation if pt refuses oral medications)             - Change zyprexa 2.5mg  qAM + 10mg  qhs to zyprexa 5mg  qAM + 5mg  qAfternoon (14:00) + 10mg  qhs - Continue haldol decanoate 150mg  IM q30 Days (last given11/30/18) - continue cogentin 0.5mg  BID  - Continue depakote 500mg  BID                                     - depakote level pending  - Dementia with behavioral disturbance r/o [place]'s versus cognitive disorder unspecified - SW teamresearching placement options  -encourage participation in groups and therapeutic milieu -Discharge planning will be ongoing  Pennelope Bracken, MD 06/07/2017, 11:12 AM

## 2017-06-07 NOTE — Progress Notes (Signed)
Patient resting in bed, not awakened for labs at this time. Labs rescheduled to 1800.

## 2017-06-07 NOTE — Progress Notes (Signed)
Patient became increasingly agitated and aggressive. Patient was banging on unit doors and became combative when staff attempted to redirect. Patient entered another's patient room and was refusing to come back into the hallway. Patient began yelling and was aggressive with staff. Patient redirected to her room. PRN Geodon 10 mg IM administered as ordered at 0245 by Erlene Quan C. Patient verbally agreed to take medication. AC and writer continued to monitor patient in her room and encouraged her to rest. Patient remained agitated and did not follow directions given by staff.  Patient currently remains awake and is pacing the halls. Patient is currently calm but continues to request "a white [place]" and "cigarettes". MHT directing patient back to bed. Patient remains steady on her feet at this time.  Staff continuing to provide support and redirection. Will continue to monitor.

## 2017-06-07 NOTE — Progress Notes (Signed)
Recreation Therapy Notes  Date: 06/07/17 Time: 1045 Location: Gym  Group Topic: Wellness  Goal Area(s) Addresses:  Patient will define components of whole wellness. Patient will verbalize benefit of whole wellness.  Intervention: 4 rubber bases, rubber ball   Activity: Kickball.  Patients were divided into two teams.  Each team would go until they reached three outs.  The teams would then reverse fields and the other team would get a chance to go.  The team that finished with the most points, wins the game.  Education: Wellness, Dentist.   Education Outcome: Acknowledges education/In group clarification offered/Needs additional education.   Clinical Observations/Feedback:  Pt did not attend group.    Victorino Sparrow, LRT/CTRS         Victorino Sparrow A 06/07/2017 12:06 PM

## 2017-06-07 NOTE — Progress Notes (Signed)
D: Pt continues to need constant redirection, pt continues to go to the door trying to leave, banging on the doors trying to leave, pt very irritable when her desires are not met.   A: Pt was offered support and encouragement. Constant redirection needed.  Pt was encourage to attend groups. Q 15 minute checks were done for safety.   R: safety maintained on unit.

## 2017-06-07 NOTE — Progress Notes (Signed)
While nurse was in med room, Tech was in [place], pt went in [place] going through the stuff and trying to go into staff bags.

## 2017-06-07 NOTE — Progress Notes (Signed)
Patient is resting in bed and appears to be asleep. Respirations even and unlabored. Patient in no acute distress. Will continue to monitor.

## 2017-06-07 NOTE — Progress Notes (Signed)
Nursing close observation note  D:Pt walking unit trying to leave, pt has to be constantly redirected and watched.. RR even and unlabored. No distress noted. A: 1:1 observation continues for safety  R: pt remains safe

## 2017-06-07 NOTE — Progress Notes (Signed)
Earlier in the evening pt was sitting in a chair outside the nursing station, when staff came back she was observed with her scrub pants removed and trying to take the rest of her clothes off. Pt was redirected and helped by staff to put her pants on.

## 2017-06-07 NOTE — Plan of Care (Signed)
Pt safe

## 2017-06-07 NOTE — Progress Notes (Signed)
While trying to give pt meds she poured them in her cup , then proceeded to walk away from the med window looking for her purse

## 2017-06-07 NOTE — Progress Notes (Signed)
Patient has been awake for the past hour. Patient is awake and pacing the unit. Patient is steady on her feet. Patient emotionally supported by staff and verbally redirected back to her room. Patient encouraged to return to bed. Patient refused. Patient provided yellow socks and ginger ale by staff. Patient checks the unit doors which remain locked and paces back to her room. Patient refused PO PRN medications. MHT on the hall. Will continue to support and monitor.

## 2017-06-07 NOTE — Plan of Care (Signed)
Pt safe on the unit

## 2017-06-08 MED ORDER — LORAZEPAM 1 MG PO TABS
2.0000 mg | ORAL_TABLET | Freq: Once | ORAL | Status: AC
  Administered 2017-06-08: 2 mg via ORAL

## 2017-06-08 MED ORDER — LORAZEPAM 2 MG/ML IJ SOLN: 2.0000 mg | Freq: Once | INTRAMUSCULAR | Status: AC

## 2017-06-08 MED ORDER — DIPHENHYDRAMINE HCL 50 MG/ML IJ SOLN
INTRAMUSCULAR | Status: AC
  Administered 2017-06-08: 50 mg via INTRAMUSCULAR
  Filled 2017-06-08: qty 1

## 2017-06-08 NOTE — Progress Notes (Signed)
D: Pt has needed constant redirection throughout the shift due to multiple attempts to elope from the unit and wandering into other pt's rooms. Pt did complete her self inventory earlier with assistance, rating her depression, hopelessness and anxiety a 0/10/5 respectively. Pt continues mentioning her kids, reporting that her goal today is "to visit kids and talk to them", and she will "try to behave".  A: Pt compliant with her meds R. Pt remains safe with 15 minute checks. Will continue POC.

## 2017-06-08 NOTE — Progress Notes (Signed)
Nursing close obs note D:Pt observed sleeping in bed with eyes closed. RR even and unlabored. No distress noted. A: 1:1 observation continues for safety  R: pt remains safe

## 2017-06-08 NOTE — Progress Notes (Signed)
Pt continues to pace the unit trying to leave, pt agitated when being redirected. Pt using fingernails trying to open doors, pt trying to go in other patients room.

## 2017-06-08 NOTE — Progress Notes (Signed)
Patient Close Obs Note  D: Pt observed in hallway with sitter. Pt still needing frequent redirection. A: Pt remains on 1:1 observation for pt's safety. R: Pt remains safe on the unit

## 2017-06-08 NOTE — Progress Notes (Signed)
Patient Close Obs Note  D: Patient resting in bed- MHT observing pt in room. Pt has needed redirection for the entire day, receiving prn meds for agitation. Pt finally resting after receiving 50 mg Benadryl, 10 mg of Geodon IM approx an hour earlier and 2 mg of ativan approximately 2 hours earlier A: Patient remains on 1:1 observation for pt's safety. R: Pt remains safe on unit.

## 2017-06-08 NOTE — Progress Notes (Signed)
Pt up walking the hall trying to open every door, pt continues to require constant redirection.pt trying to go in other patient rooms

## 2017-06-08 NOTE — Progress Notes (Signed)
Patient Close Obs Note   D: Pt observed walking with sitter in hallway. Pt attempting to go into other patient's rooms. Pt frequently needing to be redirected- A: Pt remains on 1:1 observation for pt's safety R: Pt remains safe on unit

## 2017-06-08 NOTE — Progress Notes (Signed)
Pt up trying to talk with other patients, pt making noise scaring the new pt .

## 2017-06-08 NOTE — [facility] Group Notes (Signed)
[place] Group Notes: (Clinical Social Work)   06/08/2017      Type of Therapy:  Group Therapy   Participation Level:  Did Not Attend despite MHT prompting   Selmer Dominion, LCSW 06/08/2017, 12:32 PM

## 2017-06-08 NOTE — Progress Notes (Signed)
[facility] MD Progress Note  06/08/2017 11:28 AM Ashelynn Marks  MRN:  [id number]    Subjective:  Charlese reports "  I am alright"   Objective:  Libni Fusaro is awake, alert. Seen on 1:1 for safety. Noted that patient continues to need redirection due to elopement and agitation. Patient is intrusive and is not redirectable. Continues to present with disorganization with tangential thoughts. Labs pending for Depakote level. Staff reports patient as a good appetite and reports patient is resting well. Patient denies that she is suicidal or homicidal during this assessment. Per staffing notes patient is taken medication as prescribed and tolerating medication well. Patient continue to request to leave and see her children and mother.Support, encouragement and reassurance was provided.   Principal Problem: Schizophrenia ([place]) Diagnosis:   Patient Active Problem List   Diagnosis Date Noted  . Dementia with behavioral disturbance [F03.91] 06/04/2017  . Schizophrenia ([place]) [F20.9] 05/29/2017  . Bipolar disorder, current episode manic, severe with psychotic features ([place]) [F31.2] 04/03/2017  . Hepatitis C virus infection without hepatic coma [B19.20]   . Bipolar disorder ([place]) [F31.9]   . GERD (gastroesophageal reflux disease) [K21.9]    Total Time spent with patient: 30 minutes  Past Psychiatric History: see H&P  Past Medical History:  Past Medical History:  Diagnosis Date  . Bipolar disorder ([place])   . GERD (gastroesophageal reflux disease)   . Hepatitis C virus infection without hepatic coma     Past Surgical History:  Procedure Laterality Date  . abdominal cyst removed    . CESAREAN SECTION    . COLON SURGERY     Family History: History reviewed. No pertinent family history. Family Psychiatric  History: see H&P Social History:  Social History   Substance and Sexual Activity  Alcohol Use No     Social History   Substance and Sexual Activity  Drug Use No    Social History    Socioeconomic History  . Marital status: Unknown    Spouse name: None  . Number of children: None  . Years of education: None  . Highest education level: None  Social Needs  . Financial resource strain: None  . Food insecurity - worry: None  . Food insecurity - inability: None  . Transportation needs - medical: None  . Transportation needs - non-medical: None  Occupational History  . None  Tobacco Use  . Smoking status: Current Every Day Smoker    Packs/day: 1.00    Types: Cigarettes  . Smokeless tobacco: Never Used  Substance and Sexual Activity  . Alcohol use: No  . Drug use: No  . Sexual activity: None  Other Topics Concern  . None  Social History Narrative  . None   Additional Social History:                         Sleep: Fair  Appetite:  Fair  Current Medications: Current Facility-Administered Medications  Medication Dose Route Frequency Provider Last Rate Last Dose  . acetaminophen (TYLENOL) tablet 650 mg  650 mg Oral Q6H PRN Niel Hummer, NP      . alum & mag hydroxide-simeth (MAALOX/MYLANTA) 200-200-20 MG/5ML suspension 30 mL  30 mL Oral Q4H PRN Elmarie Shiley A, NP      . benztropine (COGENTIN) tablet 0.5 mg  0.5 mg Oral BID Elmarie Shiley A, NP   0.5 mg at 06/08/17 0759  . diltiazem (CARDIZEM CD) 24 hr capsule 120 mg  120 mg  Oral Daily Elmarie Shiley A, NP   120 mg at 06/08/17 0759  . diphenhydrAMINE (BENADRYL) capsule 50 mg  50 mg Oral Q8H PRN Pennelope Bracken, MD   50 mg at 06/07/17 2023  . divalproex (DEPAKOTE) DR tablet 500 mg  500 mg Oral BID Elmarie Shiley A, NP   500 mg at 06/08/17 0759  . haloperidol (HALDOL) tablet 5 mg  5 mg Oral TID Pennelope Bracken, MD   5 mg at 06/08/17 0759  . haloperidol decanoate (HALDOL DECANOATE) 100 MG/ML injection 150 mg  150 mg Intramuscular Q30 days Pennelope Bracken, MD   150 mg at 05/31/17 1213  . LORazepam (ATIVAN) tablet 1 mg  1 mg Oral Q6H PRN Elmarie Shiley A, NP   1 mg at 06/08/17 0610   . magnesium hydroxide (MILK OF MAGNESIA) suspension 30 mL  30 mL Oral Daily PRN Elmarie Shiley A, NP      . OLANZapine zydis (ZYPREXA) disintegrating tablet 5 mg  5 mg Oral BID Pennelope Bracken, MD   5 mg at 06/08/17 0801   And  . OLANZapine zydis (ZYPREXA) disintegrating tablet 10 mg  10 mg Oral QHS Pennelope Bracken, MD   10 mg at 06/07/17 2023  . OLANZapine zydis (ZYPREXA) disintegrating tablet 5 mg  5 mg Oral Q8H PRN Pennelope Bracken, MD   5 mg at 06/07/17 0738  . ondansetron (ZOFRAN) tablet 4 mg  4 mg Oral Q8H PRN Elmarie Shiley A, NP      . ziprasidone (GEODON) injection 10 mg  10 mg Intramuscular Q12H PRN Elmarie Shiley A, NP   10 mg at 06/07/17 1837    Lab Results: No results found for this or any previous visit (from the past 58 hour(s)).  Blood Alcohol level:  Lab Results  Component Value Date   [facility] <5 05/23/2017   ETH <5 38/75/6433    Metabolic Disorder Labs: Lab Results  Component Value Date   HGBA1C 4.6 (L) 04/05/2017   MPG 85.32 04/05/2017   Lab Results  Component Value Date   PROLACTIN 50.6 (H) 04/05/2017   Lab Results  Component Value Date   CHOL 130 04/05/2017   TRIG 51 04/05/2017   HDL 57 04/05/2017   CHOLHDL 2.3 04/05/2017   VLDL 10 04/05/2017   LDLCALC 63 04/05/2017   LDLCALC 89 05/03/2012    Physical Findings: AIMS: Facial and Oral Movements Muscles of Facial Expression: None, normal Lips and Perioral Area: None, normal Jaw: None, normal Tongue: None, normal,Extremity Movements Upper (arms, wrists, hands, fingers): None, normal Lower (legs, knees, ankles, toes): None, normal, Trunk Movements Neck, shoulders, hips: None, normal, Overall Severity Severity of abnormal movements (highest score from questions above): None, normal Incapacitation due to abnormal movements: None, normal Patient's awareness of abnormal movements (rate only patient's report): No Awareness, Dental Status Current problems with teeth and/or dentures?:  No Does patient usually wear dentures?: No  CIWA:    COWS:     Musculoskeletal: Strength & Muscle Tone: within normal limits Gait & Station: normal Patient leans: N/A  Psychiatric Specialty Exam: Physical Exam  Nursing note and vitals reviewed. Constitutional: She is oriented to person, place, and time. She appears well-developed.  Cardiovascular: Normal rate.  Neurological: She is alert and oriented to person, place, and time.  Psychiatric: She has a normal mood and affect. Her behavior is normal.    Review of Systems  Psychiatric/Behavioral: Positive for hallucinations and memory loss. The patient is nervous/anxious and has  insomnia.     Blood pressure 111/77, pulse 100, temperature 98 F (36.7 C), temperature source Oral, resp. rate 18, height 5\' 3"  (1.6 m), weight 73.9 kg (163 lb), SpO2 99 %.Body mass index is 28.87 kg/m.  General Appearance: Casual and Disheveled, pleasant and smiling   silight irritability noted   Eye Contact:  Good  Speech:  Garbled  Volume:  Decreased  Mood:  Anxious and Euthymic  Affect:  Blunt, Flat and Labile  Thought Process:  Disorganized and Descriptions of Associations: Loose  Orientation:  Other:  not oriented to city, or date (unable to name month or year)  Thought Content:  Illogical, Delusions, Hallucinations: Visual and Ideas of Reference:   Delusions  Suicidal Thoughts:  No  Homicidal Thoughts:  No  Memory:  Immediate;   Poor Recent;   Poor Remote;   Poor  Judgement:  Poor  Insight:  Lacking  Psychomotor Activity:  Normal  Concentration:  Concentration: Poor  Recall:  Poor  Fund of Knowledge:  Poor  Language:  Poor  Akathisia:  No  Handed:    AIMS (if indicated):     Assets:  Desire for Improvement Housing Resilience  ADL's:  Impaired  Cognition:  Impaired,  Severe  Sleep:  Number of Hours: 4.5     Treatment Plan Summary: Daily contact with patient to assess and evaluate symptoms and progress in treatment and Medication  management   - Continue with current treatment plan listed below on 06/08/2017 except where noted  - Schizophrenia - Continuehaldol 5mg  TID - Continue zyprexa 5mg  q8h prn agitation (or geodon 20mg  IM q12h prn agitation if pt refuses oral medications)             - Continue zyprexa 2.5mg  qAM + 10mg  qhs to zyprexa 5mg  qAM + 5mg  qAfternoon (14:00) + 10mg  qhs - Continue haldol decanoate 150mg  IM q30 Days (last given11/30/18) - continue cogentin 0.5mg  BID  - Continue depakote 500mg  BID                                     - depakote level pending results.  - Dementia with behavioral disturbance r/o Alzhemier's versus cognitive disorder unspecified - SW teamresearching placement options  -encourage participation in groups and therapeutic milieu -Discharge planning will be ongoing  Derrill [place], NP 06/08/2017, 11:28 AM

## 2017-06-09 LAB — VALPROIC ACID LEVEL: Valproic Acid Lvl: 50 ug/mL (ref 50.0–100.0)

## 2017-06-09 MED ORDER — DONEPEZIL HCL 5 MG PO TABS
5.0000 mg | ORAL_TABLET | Freq: Every day | ORAL | Status: DC
  Administered 2017-06-09: 5 mg via ORAL
  Filled 2017-06-09 (×3): qty 1

## 2017-06-09 NOTE — Progress Notes (Signed)
D:Pt was resting comfortably, but doesn't rest long. Continues to pace the hall, knock on doors, yell and scream. Pt becomes combative when staff stops her from entering the rooms of her peers. Pt has no questions or concerns.    A:  Support and encouragement was offered. 15 min checks continued for safety.  R: Pt remains safe.

## 2017-06-09 NOTE — Progress Notes (Signed)
D: Pt continues to be confused, intrusive and combative with redirection. Continues to ask to be "released", beat on the med room window and the main door. Pt continues to ruminate on her children, her sister and her mother, thinking they have all come to the hospital. Pt has no other questions or concerns.    A:  Support and encouragement was offered. 15 min checks continued for safety.  R: Pt remains safe.

## 2017-06-09 NOTE — Progress Notes (Signed)
Patient Close Obs Note   D: Pt observed at nurses station with sitter standing beside pt. Pt remains calm - no behavioral issues A: Pt remains on 1:1 observation for pt's safety R: Pt remains safe on the unit

## 2017-06-09 NOTE — Progress Notes (Signed)
Patient Close Obs Note  D: Pt observed walking on the unit with sitter. Pt calmer today- easier to redirect A: Pt remains on 1:1 observation for pt's safety R: Pt remains safe on the unit

## 2017-06-09 NOTE — BHH Group Notes (Signed)
[facility] LCSW Group Therapy Note  Date/Time:  06/09/2017  11:00AM-12:00PM  Type of Therapy and Topic:  Group Therapy:  Music and Mood  Participation Level:  Did Not Attend   Description of Group: In this process group, members listened to a variety of genres of music and identified that different types of music evoke different responses.  Patients were encouraged to identify music that was soothing for them and music that was energizing for them.  Patients discussed how this knowledge can help with wellness and recovery in various ways including managing depression and anxiety as well as encouraging healthy sleep habits.    Therapeutic Goals: 1. Patients will explore the impact of different varieties of music on mood 2. Patients will verbalize the thoughts they have when listening to different types of music 3. Patients will identify music that is soothing to them as well as music that is energizing to them 4. Patients will discuss how to use this knowledge to assist in maintaining wellness and recovery 5. Patients will explore the use of music as a coping skill  Summary of Patient Progress:  Patient kept putting her head into the room, but did not join group.  Therapeutic Modalities: Solution Focused Brief Therapy Motivational Interviewing Activity   Selmer Dominion, LCSW 06/09/2017 11:52 AM

## 2017-06-09 NOTE — Progress Notes (Signed)
Patient has been much calmer today with no behavioral issues.

## 2017-06-09 NOTE — Progress Notes (Signed)
Nursing close observation note D:Pt continues to need constant re-direction, pt labile , delusional and disorganized. Pt very forgetful constantly asking for cigarettes and her keys A: 1:1 observation continues for safety  R: pt remains safe

## 2017-06-09 NOTE — Progress Notes (Signed)
[facility] MD Progress Note  06/09/2017 12:30 PM [name] Ball  MRN:  [id number]    Subjective:  [name] Ball reports "  I am looking for my mom and daughter? Have you seen them?"   Objective:  [name] Ball seen on 1:1 for safety. Per staffing reports patient slept  2 hours after medications.  Patient  continues to need redirection due to elopement risk and intrusiveness. Continues to present with disorganization with tangential thoughts patient rumination  with mother and children.Depakote level 50 ug/ml. [name] Ball denies that she is suicidal or homicidal during this assessment. Per staffing notes patient is taken medications PO. Discussed treatment plan with MD. Chart reviewed and medications. See MAR for medication recommendation and management with Dementia. Support, encouragement and reassurance was provided.   Principal Problem: Schizophrenia ([place]) Diagnosis:   Patient Active Problem List   Diagnosis Date Noted  . Dementia with behavioral disturbance [F03.91] 06/04/2017  . Schizophrenia ([place]) [F20.9] 05/29/2017  . Bipolar disorder, current episode manic, severe with psychotic features ([place]) [F31.2] 04/03/2017  . Hepatitis C virus infection without hepatic coma [B19.20]   . Bipolar disorder ([place]) [F31.9]   . GERD (gastroesophageal reflux disease) [K21.9]    Total Time spent with patient: 30 minutes  Past Psychiatric History: see H&P  Past Medical History:  Past Medical History:  Diagnosis Date  . Bipolar disorder ([place])   . GERD (gastroesophageal reflux disease)   . Hepatitis C virus infection without hepatic coma     Past Surgical History:  Procedure Laterality Date  . abdominal cyst removed    . CESAREAN SECTION    . COLON SURGERY     Family History: History reviewed. No pertinent family history. Family Psychiatric  History: see H&P Social History:  Social History   Substance and Sexual Activity  Alcohol Use No     Social History   Substance and Sexual Activity  Drug  Use No    Social History   Socioeconomic History  . Marital status: Unknown    Spouse name: None  . Number of children: None  . Years of education: None  . Highest education level: None  Social Needs  . Financial resource strain: None  . Food insecurity - worry: None  . Food insecurity - inability: None  . Transportation needs - medical: None  . Transportation needs - non-medical: None  Occupational History  . None  Tobacco Use  . Smoking status: Current Every Day Smoker    Packs/day: 1.00    Types: Cigarettes  . Smokeless tobacco: Never Used  Substance and Sexual Activity  . Alcohol use: No  . Drug use: No  . Sexual activity: None  Other Topics Concern  . None  Social History Narrative  . None   Additional Social History:                         Sleep: Fair  Appetite:  Fair  Current Medications: Current Facility-Administered Medications  Medication Dose Route Frequency Provider Last Rate Last Dose  . acetaminophen (TYLENOL) tablet 650 mg  650 mg Oral Q6H PRN Elmarie Shiley A, NP   650 mg at 06/09/17 1059  . alum & mag hydroxide-simeth (MAALOX/MYLANTA) 200-200-20 MG/5ML suspension 30 mL  30 mL Oral Q4H PRN Elmarie Shiley A, NP      . benztropine (COGENTIN) tablet 0.5 mg  0.5 mg Oral BID Elmarie Shiley A, NP   0.5 mg at 06/09/17 0744  . diltiazem (CARDIZEM CD) 24  hr capsule 120 mg  120 mg Oral Daily Elmarie Shiley A, NP   120 mg at 06/09/17 0741  . diphenhydrAMINE (BENADRYL) capsule 50 mg  50 mg Oral Q8H PRN Pennelope Bracken, MD   50 mg at 06/09/17 0149  . divalproex (DEPAKOTE) DR tablet 500 mg  500 mg Oral BID Elmarie Shiley A, NP   500 mg at 06/09/17 0740  . haloperidol (HALDOL) tablet 5 mg  5 mg Oral TID Pennelope Bracken, MD   5 mg at 06/09/17 1217  . haloperidol decanoate (HALDOL DECANOATE) 100 MG/ML injection 150 mg  150 mg Intramuscular Q30 days Pennelope Bracken, MD   150 mg at 05/31/17 1213  . LORazepam (ATIVAN) tablet 1 mg  1 mg Oral  Q6H PRN Elmarie Shiley A, NP   1 mg at 06/09/17 0103  . magnesium hydroxide (MILK OF MAGNESIA) suspension 30 mL  30 mL Oral Daily PRN Elmarie Shiley A, NP      . OLANZapine zydis (ZYPREXA) disintegrating tablet 5 mg  5 mg Oral BID Pennelope Bracken, MD   5 mg at 06/09/17 4128   And  . OLANZapine zydis (ZYPREXA) disintegrating tablet 10 mg  10 mg Oral QHS Pennelope Bracken, MD   10 mg at 06/08/17 2132  . OLANZapine zydis (ZYPREXA) disintegrating tablet 5 mg  5 mg Oral Q8H PRN Pennelope Bracken, MD   5 mg at 06/09/17 0103  . ondansetron (ZOFRAN) tablet 4 mg  4 mg Oral Q8H PRN Elmarie Shiley A, NP      . ziprasidone (GEODON) injection 10 mg  10 mg Intramuscular Q12H PRN Niel Hummer, NP   10 mg at 06/08/17 1647    Lab Results:  Results for orders placed or performed during the hospital encounter of 05/29/17 (from the past 48 hour(s))  Valproic acid level     Status: None   Collection Time: 06/09/17  6:10 AM  Result Value Ref Range   Valproic Acid Lvl 50 50.0 - 100.0 ug/mL    Comment: Performed at [facility], [place] [address] [place], [place] [address]    Blood Alcohol level:  Lab Results  Component Value Date   [facility] <5 05/23/2017   ETH <5 72/03/4708    Metabolic Disorder Labs: Lab Results  Component Value Date   HGBA1C 4.6 (L) 04/05/2017   MPG 85.32 04/05/2017   Lab Results  Component Value Date   PROLACTIN 50.6 (H) 04/05/2017   Lab Results  Component Value Date   CHOL 130 04/05/2017   TRIG 51 04/05/2017   HDL 57 04/05/2017   CHOLHDL 2.3 04/05/2017   VLDL 10 04/05/2017   LDLCALC 63 04/05/2017   LDLCALC 89 05/03/2012    Physical Findings: AIMS: Facial and Oral Movements Muscles of Facial Expression: None, normal Lips and Perioral Area: None, normal Jaw: None, normal Tongue: None, normal,Extremity Movements Upper (arms, wrists, hands, fingers): None, normal Lower (legs, knees, ankles, toes): None, normal, Trunk Movements Neck,  shoulders, hips: None, normal, Overall Severity Severity of abnormal movements (highest score from questions above): None, normal Incapacitation due to abnormal movements: None, normal Patient's awareness of abnormal movements (rate only patient's report): No Awareness, Dental Status Current problems with teeth and/or dentures?: No Does patient usually wear dentures?: No  CIWA:    COWS:     Musculoskeletal: Strength & Muscle Tone: within normal limits Gait & Station: normal Patient leans: N/A  Psychiatric Specialty Exam: Physical Exam  Nursing note and  vitals reviewed. Constitutional: She is oriented to person, place, and time. She appears well-developed.  Cardiovascular: Normal rate.  Neurological: She is alert and oriented to person, place, and time.  Psychiatric: She has a normal mood and affect. Her behavior is normal.    Review of Systems  Psychiatric/Behavioral: Positive for hallucinations and memory loss. The patient is nervous/anxious and has insomnia.     Blood pressure (!) 117/91, pulse (!) 108, temperature 97.9 F (36.6 C), resp. rate 18, height 5\' 3"  (1.6 m), weight 73.9 kg (163 lb), SpO2 99 %.Body mass index is 28.87 kg/m.  General Appearance: Casual and Disheveled, pleasant and smiling   silight irritability  Eye Contact:  Good  Speech:  Garbled  Volume:  Decreased  Mood:  Anxious and Euthymic  Affect:  Blunt, Flat and Labile  Thought Process:  Disorganized and Descriptions of Associations: Loose  Orientation:  Other:  not oriented to city, or date (unable to name month or year)  Thought Content:  Illogical, Delusions, Hallucinations: Visual and Ideas of Reference:   Delusions  Suicidal Thoughts:  No  Homicidal Thoughts:  No  Memory:  Immediate;   Poor Recent;   Poor Remote;   Poor  Judgement:  Poor  Insight:  Lacking  Psychomotor Activity:  Normal  Concentration:  Concentration: Poor  Recall:  Poor  Fund of Knowledge:  Poor  Language:  Poor  Akathisia:   No  Handed:    AIMS (if indicated):     Assets:  Desire for Improvement Housing Resilience  ADL's:  Impaired  Cognition:  Impaired,  Severe  Sleep:  Number of Hours: 2     Treatment Plan Summary: Daily contact with patient to assess and evaluate symptoms and progress in treatment and Medication management   - Continue with current treatment plan listed below on 06/09/2017 except where noted  - Schizophrenia - Continuehaldol 5mg  TID - Continue zyprexa 5mg  q8h prn agitation (or geodon 20mg  IM q12h prn agitation if pt refuses oral medications)             - Continue zyprexa 2.5mg  qAM + 10mg  qhs to zyprexa 5mg  qAM + 5mg  qAfternoon (14:00) + 10mg  qhs - Continue haldol decanoate 150mg  IM q30 Days (last given11/30/18) - discontinue cogentin 0.5mg  BID and benadryl   - Continue depakote 500mg  BID                                     - depakote level pending results.   - Dementia with behavioral disturbance r/o Alzhemier's versus cognitive disorder unspecified                  - Initiated Aricept 5mg  PO QHS  - SW teamresearching placement options   -encourage participation in groups and therapeutic milieu -Discharge planning will be ongoing  Derrill [place], NP 06/09/2017, 12:30 PM

## 2017-06-09 NOTE — Progress Notes (Signed)
D. Pt rested for approximately an hour, now up at nurse's station complaining of headache. Pt given prn med for headache  Pt currently denies SI/HI and AVH. Pt briefly laid head down on the nurse's station's counter as if she were tired. With some encouragement from staff, pt calmly walked back to room with sitter to lie down A. Labs and vitals monitored. Pt compliant with medications. Pt supported emotionally and encouraged to express concerns and ask questions.   R. Pt remains safe with 15 minute checks. Will continue POC.

## 2017-06-09 NOTE — Progress Notes (Signed)
Patient Close Obs Note  D: Pt observed sleeping -respirations even and unlabored- Sitter observed sitting beside pt's bed A: Pt remains on 1:1 observation for pt's safety R: Pt remains safe on the unit

## 2017-06-09 NOTE — Plan of Care (Signed)
Pt safe on the unit at this time

## 2017-06-10 LAB — CBC WITH DIFFERENTIAL/PLATELET
Basophils Absolute: 0 10*3/uL (ref 0.0–0.1)
Basophils Relative: 0 %
Eosinophils Absolute: 0.1 10*3/uL (ref 0.0–0.7)
Eosinophils Relative: 1 %
HCT: 34.7 % — ABNORMAL LOW (ref 36.0–46.0)
Hemoglobin: 11.4 g/dL — ABNORMAL LOW (ref 12.0–15.0)
LYMPHS ABS: 1.4 10*3/uL (ref 0.7–4.0)
Lymphocytes Relative: 25 %
MCH: 30.4 pg (ref 26.0–34.0)
MCHC: 32.9 g/dL (ref 30.0–36.0)
MCV: 92.5 fL (ref 78.0–100.0)
Monocytes Absolute: 0.4 10*3/uL (ref 0.1–1.0)
Monocytes Relative: 7 %
Neutro Abs: 3.8 10*3/uL (ref 1.7–7.7)
Neutrophils Relative %: 67 %
PLATELETS: 200 10*3/uL (ref 150–400)
RBC: 3.75 MIL/uL — AB (ref 3.87–5.11)
RDW: 13.3 % (ref 11.5–15.5)
WBC: 5.7 10*3/uL (ref 4.0–10.5)

## 2017-06-10 MED ORDER — LORAZEPAM 2 MG/ML IJ SOLN
INTRAMUSCULAR | Status: AC
  Administered 2017-06-10: 2 mg via INTRAMUSCULAR
  Filled 2017-06-10: qty 1

## 2017-06-10 MED ORDER — LORAZEPAM 2 MG/ML IJ SOLN
2.0000 mg | Freq: Once | INTRAMUSCULAR | Status: AC
  Administered 2017-06-10: 2 mg via INTRAMUSCULAR

## 2017-06-10 MED ORDER — CHLORPROMAZINE HCL 25 MG/ML IJ SOLN: 100.0000 mg | Freq: Three times a day (TID) | INTRAMUSCULAR | Status: DC | PRN

## 2017-06-10 MED ORDER — ZIPRASIDONE MESYLATE 20 MG IM SOLR
10.0000 mg | Freq: Once | INTRAMUSCULAR | Status: AC
  Administered 2017-06-10: 10 mg via INTRAMUSCULAR

## 2017-06-10 MED ORDER — NICOTINE 14 MG/24HR TD PT24
14.0000 mg | MEDICATED_PATCH | Freq: Every day | TRANSDERMAL | Status: DC
  Administered 2017-06-11 – 2017-06-16 (×3): 14 mg via TRANSDERMAL
  Filled 2017-06-10 (×13): qty 1

## 2017-06-10 MED ORDER — CHLORPROMAZINE HCL 100 MG PO TABS
100.0000 mg | ORAL_TABLET | Freq: Three times a day (TID) | ORAL | Status: DC | PRN
  Administered 2017-06-10 – 2017-06-11 (×3): 100 mg via ORAL
  Filled 2017-06-10 (×2): qty 1

## 2017-06-10 MED ORDER — ZIPRASIDONE MESYLATE 20 MG IM SOLR
INTRAMUSCULAR | Status: AC
  Administered 2017-06-10: 10 mg via INTRAMUSCULAR
  Filled 2017-06-10: qty 20

## 2017-06-10 NOTE — Progress Notes (Signed)
1:1 Note 1700  Patient walked in hallway with nurse.  Patient talked about her children, restaurants she wanted to go to eat after she is discharged from [facility].  Respirations even and unlabored.  No signs/symptoms of pain/distress noted on patient's face/body movements.  Patient enjoys talking to staff and walking in the hallway.  Safety maintained with 1:1 per MD orders for safety.

## 2017-06-10 NOTE — Tx Team (Signed)
Interdisciplinary Treatment and Diagnostic Plan Update  06/10/2017 Time of Session: 12:24 PM  [name] Ball MRN: [id number]  Principal Diagnosis: Schizophrenia [facility])  Secondary Diagnoses: Principal Problem:   Schizophrenia ([place]) Active Problems:   Dementia with behavioral disturbance   Current Medications:  Current Facility-Administered Medications  Medication Dose Route Frequency Provider Last Rate Last Dose  . acetaminophen (TYLENOL) tablet 650 mg  650 mg Oral Q6H PRN Elmarie Shiley A, NP   650 mg at 06/09/17 1059  . alum & mag hydroxide-simeth (MAALOX/MYLANTA) 200-200-20 MG/5ML suspension 30 mL  30 mL Oral Q4H PRN Elmarie Shiley A, NP      . chlorproMAZINE (THORAZINE) injection 100 mg  100 mg Intramuscular TID PRN Izediuno, Laruth Bouchard, MD      . chlorproMAZINE (THORAZINE) tablet 100 mg  100 mg Oral TID PRN Artist Beach, MD      . diltiazem (CARDIZEM CD) 24 hr capsule 120 mg  120 mg Oral Daily Elmarie Shiley A, NP   120 mg at 06/10/17 0859  . divalproex (DEPAKOTE) DR tablet 500 mg  500 mg Oral BID Elmarie Shiley A, NP   500 mg at 06/10/17 0900  . haloperidol (HALDOL) tablet 5 mg  5 mg Oral TID Pennelope Bracken, MD   5 mg at 06/10/17 1216  . haloperidol decanoate (HALDOL DECANOATE) 100 MG/ML injection 150 mg  150 mg Intramuscular Q30 days Pennelope Bracken, MD   150 mg at 05/31/17 1213  . LORazepam (ATIVAN) tablet 1 mg  1 mg Oral Q6H PRN Niel Hummer, NP   1 mg at 06/09/17 2120  . magnesium hydroxide (MILK OF MAGNESIA) suspension 30 mL  30 mL Oral Daily PRN Elmarie Shiley A, NP      . OLANZapine zydis (ZYPREXA) disintegrating tablet 5 mg  5 mg Oral BID Pennelope Bracken, MD   5 mg at 06/10/17 0900   And  . OLANZapine zydis (ZYPREXA) disintegrating tablet 10 mg  10 mg Oral QHS Pennelope Bracken, MD   10 mg at 06/09/17 2120  . OLANZapine zydis (ZYPREXA) disintegrating tablet 5 mg  5 mg Oral Q8H PRN Pennelope Bracken, MD   5 mg at 06/09/17 0103  .  ondansetron (ZOFRAN) tablet 4 mg  4 mg Oral Q8H PRN Niel Hummer, NP        PTA Medications: Medications Prior to Admission  Medication Sig Dispense Refill Last Dose  . ARIPiprazole (ABILIFY) 20 MG tablet Take 1 tablet (20 mg total) by mouth daily. For mood control (Patient not taking: Reported on 05/23/2017) 30 tablet 0 Not Taking at Unknown time  . benztropine (COGENTIN) 0.5 MG tablet Take 1 tablet (0.5 mg total) by mouth 2 (two) times daily. For prevention of drug induced tremors 60 tablet 0 05/23/2017 at Unknown time  . clonazePAM (KLONOPIN) 1 MG tablet Take 0.5-1 mg by mouth 3 (three) times daily. Take one-half tablet by mouth twice daily and take one tablet at bedtime   05/23/2017 at Unknown time  . diltiazem (DILACOR XR) 120 MG 24 hr capsule Take 1 capsule (120 mg total) by mouth daily. For high blood pressure 10 capsule 0 05/23/2017 at Unknown time  . divalproex (DEPAKOTE) 500 MG DR tablet Take 2 tablets (1,000 mg total) by mouth at bedtime. For mood stabilization 60 tablet 0 05/22/2017 at 2000  . haloperidol (HALDOL) 5 MG tablet Take 1 tablet (5 mg total) by mouth 2 (two) times daily. For mood control (Patient taking differently: Take 5  mg by mouth 3 (three) times daily. For mood control) 30 tablet 0 05/23/2017 at 800a  . haloperidol decanoate (HALDOL DECANOATE) 100 MG/ML injection Inject 1.5 mLs (150 mg total) into the muscle every 30 (thirty) days. (Due on 05-06-17): For mood control 1.5 mL 0 05/07/2017 at Unknown time  . ibuprofen (ADVIL,MOTRIN) 200 MG tablet Take 1-2 tablets (200-400 mg total) by mouth every 4 (four) hours as needed for moderate pain. 1 tablet 0 unknown  . LORazepam (ATIVAN) 1 MG tablet Take 1 mg by mouth every 6 (six) hours as needed for anxiety.   unknown  . nicotine (NICODERM CQ - DOSED IN MG/24 HR) 7 mg/24hr patch Place 1 patch (7 mg total) onto the skin daily. (May purchase from over the counter): For smoking cessation 28 patch 0 unknown  . pantoprazole (PROTONIX)  20 MG tablet Take 1 tablet (20 mg total) by mouth daily. For acid reflux   05/23/2017 at Unknown time  . predniSONE (DELTASONE) 10 MG tablet Take 2 tablets (20 mg total) by mouth daily. 10 tablet 0   . QUEtiapine (SEROQUEL) 25 MG tablet Take 25 mg by mouth 3 (three) times daily.   05/23/2017 at 800a  . zolpidem (AMBIEN) 5 MG tablet Take 1 tablet (5 mg total) by mouth at bedtime as needed for sleep. 7 tablet 0 unknown    Patient Stressors: Financial difficulties Medication change or noncompliance Other: ongoing psychosis  Patient Strengths: General fund of knowledge Motivation for treatment/growth Physical Health  Treatment Modalities: Medication Management, Group therapy, Case management,  1 to 1 session with clinician, Psychoeducation, Recreational therapy.   Physician Treatment Plan for Primary Diagnosis: Schizophrenia ([place]) Long Term Goal(s): Improvement in symptoms so as ready for discharge  Short Term Goals: Ability to maintain clinical measurements within normal limits will improve Ability to demonstrate self-control will improve Ability to identify and develop effective coping behaviors will improve  Medication Management: Evaluate patient's response, side effects, and tolerance of medication regimen.  Therapeutic Interventions: 1 to 1 sessions, Unit Group sessions and Medication administration.  Evaluation of Outcomes: Progressing   12/4: Pt continues to have delusions and episodes of agitation. She attempts to elope, but she is redirectable. She is only oriented to self and year, and she will require placement at facility with ability to care for those with cognitive disorders/dementia.  - Schizophrenia - Continuehaldol 16m TID                         - Start haldol 552mpo q8h prn agitation (or geodon 2038mM q12h prn agitation if pt refuses oral medications) - Continue haldol decanoate 150m32m q30 Days (last given11/30/18) -  Continue abilify 20mg71my - continue cogentin 0.5mg B79m- Continue sinequan 25mg q67mrn insomnia  - Continue depakote 500mg BI18m2/10: Patient has a long history of schizophrenia and SUD. She has been resistant to multiple agents over the years. She exhibits cognitive deficits which is a feature of chronic schizophrenia. CT head is equivocal as there is some gray matter loss in the frontal and temporal lobes. Very difficult to establish concurrent dementia. 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 Available options at this time is trial of Clozapine and probably ECT if she fails adequate trial.   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 Clozapine workup 2. Consent from legal guardian when we have results of work up 3.  Emergency use of Chlorpromazine until we initiate Clozapine 4. Continue close observation as patient is potentially dangerous.      Physician Treatment Plan for Secondary Diagnosis: Principal Problem:   Schizophrenia ([place]) Active Problems:   Dementia with behavioral disturbance   Long Term Goal(s): Improvement in symptoms so as ready for discharge  Short Term Goals: Ability to maintain clinical measurements within normal limits will improve Ability to demonstrate self-control will improve Ability to identify and develop effective coping behaviors will improve  Medication Management: Evaluate patient's response, side effects, and tolerance of medication regimen.  Therapeutic Interventions: 1 to 1 sessions, Unit Group sessions and Medication administration.  Evaluation of Outcomes: Progressing   RN Treatment Plan for Primary Diagnosis: Schizophrenia ([place]) Long Term Goal(s): Knowledge of disease and therapeutic regimen to maintain health will improve  Short Term Goals: Ability to identify and develop effective coping behaviors will  improve and Compliance with prescribed medications will improve  Medication Management: RN will administer medications as ordered by provider, will assess and evaluate patient's response and provide education to patient for prescribed medication. RN will report any adverse and/or side effects to prescribing provider.  Therapeutic Interventions: 1 on 1 counseling sessions, Psychoeducation, Medication administration, Evaluate responses to treatment, Monitor vital signs and CBGs as ordered, Perform/monitor CIWA, COWS, AIMS and Fall Risk screenings as ordered, Perform wound care treatments as ordered.  Evaluation of Outcomes: Progressing   LCSW Treatment Plan for Primary Diagnosis: Schizophrenia ([place]) Long Term Goal(s): Safe transition to appropriate next level of care at discharge, Engage patient in therapeutic group addressing interpersonal concerns.  Short Term Goals: Engage patient in aftercare planning with referrals and resources  Therapeutic Interventions: Assess for all discharge needs, 1 to 1 time with Social worker, Explore available resources and support systems, Assess for adequacy in community support network, Educate family and significant other(s) on suicide prevention, Complete Psychosocial Assessment, Interpersonal group therapy.  Evaluation of Outcomes: Not Met  Unsure of where pt will go at d/c.  CSW to talk to Mr Raynelle Dick about taking pt back 12/10:  Had been checking into locked memory care units based on dx of dementia, but current dr note suggests different course of treatment.  Dr R to make decision tomorrow based on notes of Dr IZ over weekend  Progress in Treatment: Attending groups: Yes Participating in groups: Yes Taking medication as prescribed: Yes Toleration medication: Yes, no side effects reported at this time Family/Significant other contact made: Yes  Guardian Patient understands diagnosis: No  Limited insight Discussing patient identified problems/goals with  staff: Yes Medical problems stabilized or resolved: Yes Denies suicidal/homicidal ideation: Yes Issues/concerns per patient self-inventory: None Other: N/A  New problem(s) identified: None identified at this time.   New Short Term/Long Term Goal(s): None identified at this time.   Discharge Plan or Barriers:   Reason for Continuation of Hospitalization: Disorganization Delusions  Medication stabilization   Estimated Length of Stay: 12/14  Attendees: Patient: 06/10/2017  12:24 PM  Physician: Maris Berger, MD 06/10/2017  12:24 PM  Nursing: Sena Hitch, RN 06/10/2017  12:24 PM  RN Care Manager: Lars Pinks, RN 06/10/2017  12:24 PM  Social Worker: Ripley Fraise 06/10/2017  12:24 PM  Recreational Therapist: Winfield Cunas 06/10/2017  12:24 PM  Other: Norberto Sorenson 06/10/2017  12:24 PM  Other:  06/10/2017  12:24 PM    Scribe for Treatment Team:  Roque Lias LCSW 06/10/2017 12:24 PM

## 2017-06-10 NOTE — Progress Notes (Signed)
Nursing 1:1 note D:Pt pacing unit. RR even and unlabored. No distress noted. A: 1:1 observation continues for safety  R: pt remains safe

## 2017-06-10 NOTE — Plan of Care (Signed)
Nurse discussed anxiety, depression, coping skills with patient. 
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
## 2017-06-10 NOTE — Progress Notes (Signed)
Closed door seclusion order obtained.

## 2017-06-10 NOTE — Progress Notes (Signed)
1:1 Note 1115  EKG completed and given to NP for review. 1:1 continues for safety.  Respirations even and unlabored.  No signs/symptoms of pain/distress noted on patient's face/body movements.  Patient was singing while EKG was completed.  Patient confused but in good spirits.  1:1 per MD order for safety.

## 2017-06-10 NOTE — Progress Notes (Signed)
Nursing 1:1 note D:Pt observed sleeping in bed with eyes closed. RR even and unlabored. No distress noted. A: 1:1 observation continues for safety  R: pt remains safe  

## 2017-06-10 NOTE — Progress Notes (Signed)
Pt thought staff members had drugs for pt, pt continued to ask various staff for some illegal drugs

## 2017-06-10 NOTE — Progress Notes (Signed)
Pt constantly standing at the door looking out the window, pt continuously going to door looking out telling staff she is leaving.

## 2017-06-10 NOTE — Plan of Care (Signed)
  Safety: Periods of time without injury will increase 06/10/2017 2053 - Progressing by Providence Crosby, RN Note Pt safe on unit, pt has to be constantly re directed

## 2017-06-10 NOTE — Progress Notes (Signed)
D: Pt has Animal nutritionist time, pt constantly needs redirection, pt constantly talks about her kids, pt constantly stated she is leaving. Pt continues to be delusional , disorganized and confabulates.  A: Pt was offered support and encouragement. Pt was given scheduled medications. Pt was encourage to attend groups. Q 15 minute checks were done for safety.  R:Pt attends groups and interacts well with peers and staff. Pt is taking medication. Pt receptive to treatment and safety maintained on unit.

## 2017-06-10 NOTE — Progress Notes (Signed)
1:1 Note 1415  Patient has been walking the hallway wanting to go home to her children.  Ready to leave to go to her car.  Patient has been encouraged throughout the day to not stand near the 500 hall door. Patient ate her lunch and snack this afternoon.  Patient has been calm, talking to staff after taking thorazine after lunch.  Respirations even and unlabored.  No signs/symptoms of pain/distress noted on patient's face/body movements.  1:1 continues per MD order for safety.
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
## 2017-06-10 NOTE — Progress Notes (Signed)
Patient came out of locked seclusion at 0856.  Patient now on 1:1 per MD/NP order.  Patient denied SI and HI.  Denied A/V hallucinations.  Denied pain.  Respirations even and unlabored.  No signs/symptoms of pain/distress noted on patient's face/body movements.  1:1 order for patient's safety continues.

## 2017-06-11 DIAGNOSIS — R450 Nervousness: Secondary | ICD-10-CM

## 2017-06-11 DIAGNOSIS — F419 Anxiety disorder, unspecified: Secondary | ICD-10-CM

## 2017-06-11 MED ORDER — CLOZAPINE 25 MG PO TABS
25.0000 mg | ORAL_TABLET | Freq: Two times a day (BID) | ORAL | Status: AC
  Administered 2017-06-12 (×2): 25 mg via ORAL
  Filled 2017-06-11 (×2): qty 1

## 2017-06-11 MED ORDER — CLOZAPINE 25 MG PO TABS
12.5000 mg | ORAL_TABLET | Freq: Two times a day (BID) | ORAL | Status: AC
  Administered 2017-06-11 (×2): 12.5 mg via ORAL
  Filled 2017-06-11 (×2): qty 1

## 2017-06-11 MED ORDER — CLOZAPINE 25 MG PO TABS
50.0000 mg | ORAL_TABLET | Freq: Two times a day (BID) | ORAL | Status: DC
  Administered 2017-06-13 (×2): 50 mg via ORAL
  Filled 2017-06-11 (×7): qty 2

## 2017-06-11 NOTE — Progress Notes (Signed)
Nursing 1:1 note D:Pt observed lying in bed with eyes closed. RR even and unlabored. No distress noted. A: 1:1 observation continues for safety  R: pt remains safe  
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
## 2017-06-11 NOTE — Progress Notes (Signed)
Pt has needed constant redirection , pt has been redirectable most of the evening.

## 2017-06-11 NOTE — Progress Notes (Signed)
Patient being monitored on 1:1 due to disorganized behaviors.  Patient has been able to maintain safety with the assistance of 1:1 staff.  Patietn has had no incidents of behavioral dyscontrol. 1:1 staff in close proximity to patient at all times.

## 2017-06-11 NOTE — Progress Notes (Signed)
Nursing 1:1 note D:Pt observed sleeping in bed with eyes closed. RR even and unlabored. No distress noted. A: 1:1 observation continues for safety  R: pt remains safe  

## 2017-06-11 NOTE — Progress Notes (Signed)
Pharmacy:  Clozapine  Patient added to clozapine rems registry today.  Requires weekly ANC.   Einar Grad, [place] D

## 2017-06-11 NOTE — NC FL2 (Signed)
[place] LEVEL OF CARE SCREENING TOOL     IDENTIFICATION  Patient Name: [name] Ball Birthdate: September 18, 1965 Sex: female Admission Date (Current Location): 05/29/2017  [facility] and [place] Number:  Selena Lesser [id number] [place] and Address:  Larence Penning [facility] [address]  [place] Simon [phone])      Provider Number: [id number]  Attending Physician Name and Address:  Pennelope Bracken*  Relative Name and Phone Number:       Current Level of Care: Hospital Recommended Level of Care: [place] Prior Approval Number:    Date Approved/Denied:   PASRR Number:    Discharge Plan: [facility]    Current Diagnoses: Patient Active Problem List   Diagnosis Date Noted  . Dementia with behavioral disturbance 06/04/2017  . Schizophrenia ([place]) 05/29/2017  . Bipolar disorder, current episode manic, severe with psychotic features ([place]) 04/03/2017  . Hepatitis C virus infection without hepatic coma   . Bipolar disorder ([place])   . GERD (gastroesophageal reflux disease)     Orientation RESPIRATION BLADDER Height & Weight     Self, Situation  Normal Continent Weight: 163 lb (73.9 kg) Height:  5\' 3"  (160 cm)  BEHAVIORAL SYMPTOMS/MOOD NEUROLOGICAL BOWEL NUTRITION STATUS  Wanderer (None) Continent (Regular diet)  AMBULATORY STATUS COMMUNICATION OF NEEDS Skin   Independent Verbally Normal                       Personal Care Assistance Level of Assistance    Bathing Assistance: Independent Feeding assistance: Independent Dressing Assistance: Independent     Functional Limitations Info  (None)          SPECIAL CARE FACTORS FREQUENCY  (None)                    Contractures Contractures Info: Not present    Additional Factors Info  (None)               Current Medications (06/11/2017):  This is the current hospital active medication list    Discharge Medications: Please see discharge summary for a list of discharge  medications.  cloZAPine 100 MG tablet  Commonly known as: CLOZARIL  Take 1 tablet (100 mg) by mouth in the morning & 2 tablets (200 mg) at bedtime: For mood control  For: Mood control  Next does due:          diltiazem 120 MG 24 hr capsule  Commonly known as: CARDIZEM CD  Start taking on: 06/21/2017  Take 1 capsule (120 mg total) by mouth daily. For high blood pressure  For: High Blood Pressure Disorder  Replaces: diltiazem 120 MG 24 hr capsule  Next does due:          divalproex 500 MG DR tablet  Commonly known as: DEPAKOTE  Take 1 tablet (500 mg total) by mouth 2 (two) times daily. For mood stabilization  What changed:   0 how much to take  0 when to take this  For: Mood stabilization  Next does due:          haloperidol 5 MG tablet  Commonly known as: HALDOL  Take 1 tablet (5 mg total) by mouth 3 (three) times daily. For mood control  What changed: when to take this  For: Mood control  Next does due:          haloperidol decanoate 100 MG/ML injection  Commonly known as: HALDOL DECANOATE  Start taking on: 06/30/2017  Inject 1.5 mLs (150  mg total) into the muscle every 30 (thirty) days. (Due on 06-30-17): For mood control  What changed: additional instructions  For: Mood control  Next does due:          LORazepam 1 MG tablet  Commonly known as: ATIVAN  Take 1 tablet (1 mg total) by mouth every 6 (six) hours as needed for anxiety.  For: Agitation  Next does due:          nicotine 14 mg/24hr patch  Commonly known as: NICODERM CQ - dosed in mg/24 hours  Start taking on: 06/21/2017  Place 1 patch (14 mg total) onto the skin daily. For smoking cessation  For: Nicotine Addiction  Replaces: nicotine 7 mg/24hr patch  Next does due:            Relevant Imaging Results:  Relevant Lab Results:   Additional [place], LCSW

## 2017-06-11 NOTE — Progress Notes (Signed)
Pt stated earlier she needed to get home to her children then talked about getting drugs and smoking a cigarette.

## 2017-06-11 NOTE — Plan of Care (Signed)
  Activity: Sleeping patterns will improve 06/11/2017 2140 - Progressing by Providence Crosby, RN Note Pt slept over 5 hrs last night   Safety: Periods of time without injury will increase 06/11/2017 2140 by Providence Crosby, RN Note Pt safe on the unit at this time   Health Behavior/Discharge Planning: Compliance with prescribed medication regimen will improve 06/11/2017 2140 by Providence Crosby, RN Note Pt took PO medications without issue

## 2017-06-11 NOTE — Progress Notes (Signed)
Did not attend group 

## 2017-06-12 MED ORDER — HYDROXYZINE HCL 25 MG PO TABS
25.0000 mg | ORAL_TABLET | Freq: Once | ORAL | Status: AC | PRN
  Administered 2017-06-12: 25 mg via ORAL
  Filled 2017-06-12: qty 1

## 2017-06-12 NOTE — Progress Notes (Signed)
Nursing 1:1 note D:Pt observed sleeping in bed with eyes closed. RR even and unlabored. No distress noted. A: 1:1 observation continues for safety  R: pt remains safe  

## 2017-06-12 NOTE — Progress Notes (Signed)
1:1 Discontinued 1500  Patient presently laying in bed with eyes closed resting.  Respirations even and unlabored.  No signs/symptoms of pain/distress noted on patient's face/body movements.  Safety maintained with 15 minute checks.

## 2017-06-12 NOTE — Progress Notes (Signed)
1:1 discontinued 1530  Patient sitting in dayroom talking to MD.  Patient drinking fluids and talking to MD.  Patient has been seen rocking back and forth in her chair several times today.  Respirations even and unlabored.  No signs/symptoms of pain/distress noted on patient's face/body movements.  Safety maintained with 15 minute checks.  Patient's self inventory sheet, patient sleeps good, no sleep medication given.  Good appetite, low energy level, good concentration.  Rated depression and hopeless 4, anxiety 5.  Denied withdrawals.  Denied SI.  Denied physical problems.  Denied physical pain.  Goal is discharge planning.  Plans to talk to SW.  No discharge plans at this time.

## 2017-06-12 NOTE — Progress Notes (Signed)
1:1 discontinued note 1730  Patient has been walking in hallway.  Has sat in chair in dayroom, at times has been rocking back and forth in her chair.  Patient rested in bed while other patients/staff went downstairs for recreation.  Patient presently in dining room for dinner.  Respirations even and unlabored.  No signs/symptoms of pain/distress noted on patient's face/body movements.  Safety maintained with 15 minute checks.

## 2017-06-12 NOTE — Progress Notes (Signed)
1:1 Note 1330  Patient's 1:1 has been discontinued per MD order.  Patient talking to MD presently.  Patient denied SI and HI.  Denied A/V hallucinations.  Denied pain.

## 2017-06-12 NOTE — Progress Notes (Signed)
1:1 Note 0800  Patient has been walking the 500 hall this morning with 1:1 present for safety.  Patient stated she ate a good breakfast.  Patient denied SI and HI.  Denied A/V hallucinations.  Respirations even and unlabored.  No signs/symptoms of pain/distress noted on patient's face/body movements.  Safety maintained with 1:1 per MD order.

## 2017-06-12 NOTE — Progress Notes (Signed)
Recreation Therapy Notes  Date: 06/12/17 Time: 1000 Location: 500 Hall Dayroom  Group Topic: Communication  Goal Area(s) Addresses:  Patient will effectively communicate with peers in group.  Patient will verbalize benefit of healthy communication. Patient will verbalize positive effect of healthy communication on post d/c goals.  Patient will identify communication techniques that made activity effective for group.   Intervention:  Blank paper, pencils, geometrical drawings                   Activity: Back to Back Drawings.  Patients were grouped in pairs.  Patients were seated back to back.  One person was given a picture to describe to their partner.  The person listening could not ask any questions of the speaker.  Once they were finished, they could compare pictures to see how accurate the listener was in drawing the picture.  Education:Communication, Discharge Planning  Education Outcome: Acknowledges understanding/In group clarification offered/Needs additional education.   Clinical Observations/Feedback: Pt did not attend group.    Victorino Sparrow, LRT/CTRS         Victorino Sparrow A 06/12/2017 12:06 PM

## 2017-06-12 NOTE — Progress Notes (Signed)
Nursing 1:1 note D:Pt observed laying in bed with eyes closed. RR even and unlabored. No distress noted. A: 1:1 observation continues for safety  R: pt remains safe   

## 2017-06-12 NOTE — Progress Notes (Signed)
1:1  Note 1100  Patient has been walking 500 hall this morning with 1:1 present.  Patient has been talking to staff.  Patient presently resting in bed with eyes closed.  No signs/symptoms of pain/distress noted on patient's face/body movements.   1:1 continues per MD order.

## 2017-06-12 NOTE — Progress Notes (Signed)
1:1 discontinued 1930  Patient laying in bed, resting.  Patient went to dining room for dinner and enjoyed her dinner.  Patient was given tylenol for headache and ativan for anxiety after dinner.  Respirations even and unlabored.  No signs/symptoms of pain/distress noted on patient's face/body movements.  Safety maintained with 15 minute checks.
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
## 2017-06-12 NOTE — Plan of Care (Signed)
Nurse discussed depression, anxiety, coping skills with patient.  

## 2017-06-13 MED ORDER — CLOZAPINE 25 MG PO TABS
125.0000 mg | ORAL_TABLET | Freq: Two times a day (BID) | ORAL | Status: DC
  Filled 2017-06-13 (×2): qty 1

## 2017-06-13 MED ORDER — CLOZAPINE 100 MG PO TABS
100.0000 mg | ORAL_TABLET | ORAL | Status: DC
  Administered 2017-06-17 – 2017-06-20 (×4): 100 mg via ORAL
  Filled 2017-06-13 (×6): qty 1

## 2017-06-13 MED ORDER — CLOZAPINE 100 MG PO TABS
200.0000 mg | ORAL_TABLET | Freq: Every day | ORAL | Status: DC
  Administered 2017-06-17 – 2017-06-19 (×3): 200 mg via ORAL
  Filled 2017-06-13 (×5): qty 2

## 2017-06-13 MED ORDER — CLOZAPINE 25 MG PO TABS
75.0000 mg | ORAL_TABLET | Freq: Two times a day (BID) | ORAL | Status: AC
  Administered 2017-06-14 (×2): 75 mg via ORAL
  Filled 2017-06-13 (×2): qty 3

## 2017-06-13 MED ORDER — CLOZAPINE 100 MG PO TABS
100.0000 mg | ORAL_TABLET | Freq: Two times a day (BID) | ORAL | Status: AC
  Administered 2017-06-15 (×2): 100 mg via ORAL
  Filled 2017-06-13 (×2): qty 1

## 2017-06-13 NOTE — Progress Notes (Signed)
  DATA ACTION RESPONSE  Objective- Pt. is visible in the dayroom, seen eating a snack.Presents with a flat/anxious affect and mood.Brightens mildly on approach.Cooperative this evening. No abnormal s/s.  Subjective- Denies having any SI/HI/AVH/Pain at this time. Is cooperative and remains safe on the unit.  1:1 interaction in private to establish rapport. Encouragement, education, & support given from staff.  PRN Ativan requested and will re-eval accordingly.   Safety maintained with Q 15 checks. Continue with POC.

## 2017-06-13 NOTE — Progress Notes (Signed)
Dar Note: Patient presents with calm affect and pleasant mood.  Patient is up and visible in the dayroom.  Denies suicidal thoughts, auditory and visual hallucinations.  Patient is still disorganized, delusional and speech is tangential.  Medications given as prescribed.  Routine safety checks maintained.  Patient attended group and participated.  Patient is safe on the unit. Support and encouragement offered as needed.

## 2017-06-13 NOTE — Tx Team (Signed)
Interdisciplinary Treatment and Diagnostic Plan Update  06/13/2017 Time of Session: 10:31 AM  [name] Ball MRN: [id number]  Principal Diagnosis: Schizophrenia [facility])  Secondary Diagnoses: Principal Problem:   Schizophrenia ([place]) Active Problems:   Dementia with behavioral disturbance   Current Medications:  Current Facility-Administered Medications  Medication Dose Route Frequency Provider Last Rate Last Dose  . acetaminophen (TYLENOL) tablet 650 mg  650 mg Oral Q6H PRN Niel Hummer, NP   650 mg at 06/12/17 1907  . alum & mag hydroxide-simeth (MAALOX/MYLANTA) 200-200-20 MG/5ML suspension 30 mL  30 mL Oral Q4H PRN Elmarie Shiley A, NP      . chlorproMAZINE (THORAZINE) injection 100 mg  100 mg Intramuscular TID PRN Izediuno, Laruth Bouchard, MD      . chlorproMAZINE (THORAZINE) tablet 100 mg  100 mg Oral TID PRN Artist Beach, MD   100 mg at 06/11/17 2101  . cloZAPine (CLOZARIL) tablet 50 mg  50 mg Oral BID Pennelope Bracken, MD   50 mg at 06/13/17 1011  . diltiazem (CARDIZEM CD) 24 hr capsule 120 mg  120 mg Oral Daily Elmarie Shiley A, NP   120 mg at 06/13/17 1012  . divalproex (DEPAKOTE) DR tablet 500 mg  500 mg Oral BID Elmarie Shiley A, NP   500 mg at 06/13/17 1011  . haloperidol (HALDOL) tablet 5 mg  5 mg Oral TID Pennelope Bracken, MD   5 mg at 06/13/17 1011  . haloperidol decanoate (HALDOL DECANOATE) 100 MG/ML injection 150 mg  150 mg Intramuscular Q30 days Pennelope Bracken, MD   150 mg at 05/31/17 1213  . LORazepam (ATIVAN) tablet 1 mg  1 mg Oral Q6H PRN Niel Hummer, NP   1 mg at 06/12/17 1908  . magnesium hydroxide (MILK OF MAGNESIA) suspension 30 mL  30 mL Oral Daily PRN Elmarie Shiley A, NP      . nicotine (NICODERM CQ - dosed in mg/24 hours) patch 14 mg  14 mg Transdermal Daily Derrill Center, NP   14 mg at 06/11/17 0752  . ondansetron (ZOFRAN) tablet 4 mg  4 mg Oral Q8H PRN Niel Hummer, NP        PTA Medications: Medications Prior to Admission   Medication Sig Dispense Refill Last Dose  . ARIPiprazole (ABILIFY) 20 MG tablet Take 1 tablet (20 mg total) by mouth daily. For mood control (Patient not taking: Reported on 05/23/2017) 30 tablet 0 Not Taking at Unknown time  . benztropine (COGENTIN) 0.5 MG tablet Take 1 tablet (0.5 mg total) by mouth 2 (two) times daily. For prevention of drug induced tremors 60 tablet 0 05/23/2017 at Unknown time  . clonazePAM (KLONOPIN) 1 MG tablet Take 0.5-1 mg by mouth 3 (three) times daily. Take one-half tablet by mouth twice daily and take one tablet at bedtime   05/23/2017 at Unknown time  . diltiazem (DILACOR XR) 120 MG 24 hr capsule Take 1 capsule (120 mg total) by mouth daily. For high blood pressure 10 capsule 0 05/23/2017 at Unknown time  . divalproex (DEPAKOTE) 500 MG DR tablet Take 2 tablets (1,000 mg total) by mouth at bedtime. For mood stabilization 60 tablet 0 05/22/2017 at 2000  . haloperidol (HALDOL) 5 MG tablet Take 1 tablet (5 mg total) by mouth 2 (two) times daily. For mood control (Patient taking differently: Take 5 mg by mouth 3 (three) times daily. For mood control) 30 tablet 0 05/23/2017 at 800a  . haloperidol decanoate (HALDOL DECANOATE) 100 MG/ML  injection Inject 1.5 mLs (150 mg total) into the muscle every 30 (thirty) days. (Due on 05-06-17): For mood control 1.5 mL 0 05/07/2017 at Unknown time  . ibuprofen (ADVIL,MOTRIN) 200 MG tablet Take 1-2 tablets (200-400 mg total) by mouth every 4 (four) hours as needed for moderate pain. 1 tablet 0 unknown  . LORazepam (ATIVAN) 1 MG tablet Take 1 mg by mouth every 6 (six) hours as needed for anxiety.   unknown  . nicotine (NICODERM CQ - DOSED IN MG/24 HR) 7 mg/24hr patch Place 1 patch (7 mg total) onto the skin daily. (May purchase from over the counter): For smoking cessation 28 patch 0 unknown  . pantoprazole (PROTONIX) 20 MG tablet Take 1 tablet (20 mg total) by mouth daily. For acid reflux   05/23/2017 at Unknown time  . predniSONE (DELTASONE)  10 MG tablet Take 2 tablets (20 mg total) by mouth daily. 10 tablet 0   . QUEtiapine (SEROQUEL) 25 MG tablet Take 25 mg by mouth 3 (three) times daily.   05/23/2017 at 800a  . zolpidem (AMBIEN) 5 MG tablet Take 1 tablet (5 mg total) by mouth at bedtime as needed for sleep. 7 tablet 0 unknown    Patient Stressors: Financial difficulties Medication change or noncompliance Other: ongoing psychosis  Patient Strengths: General fund of knowledge Motivation for treatment/growth Physical Health  Treatment Modalities: Medication Management, Group therapy, Case management,  1 to 1 session with clinician, Psychoeducation, Recreational therapy.   Physician Treatment Plan for Primary Diagnosis: Schizophrenia ([place]) Long Term Goal(s): Improvement in symptoms so as ready for discharge  Short Term Goals: Ability to maintain clinical measurements within normal limits will improve Ability to demonstrate self-control will improve Ability to identify and develop effective coping behaviors will improve  Medication Management: Evaluate patient's response, side effects, and tolerance of medication regimen.  Therapeutic Interventions: 1 to 1 sessions, Unit Group sessions and Medication administration.  Evaluation of Outcomes: Progressing   12/4: Pt continues to have delusions and episodes of agitation. She attempts to elope, but she is redirectable. She is only oriented to self and year, and she will require placement at facility with ability to care for those with cognitive disorders/dementia.  - Schizophrenia - Continuehaldol '5mg'$  TID                         - Start haldol '5mg'$  po q8h prn agitation (or geodon '20mg'$  IM q12h prn agitation if pt refuses oral medications) - Continue haldol decanoate '150mg'$  IM q30 Days (last given11/30/18) - Continue abilify '20mg'$  qDay - continue cogentin 0.'5mg'$  BID - Continue sinequan '25mg'$  qhs prn  insomnia  - Continue depakote '500mg'$  BID  12/10: Patient has a long history of schizophrenia and SUD. She has been resistant to multiple agents over the years. She exhibits cognitive deficits which is a feature of chronic schizophrenia. CT head is equivocal as there is some gray matter loss in the frontal and temporal lobes. Very difficult to establish concurrent dementia. 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 Available options at this time is trial of Clozapine and probably ECT if she fails adequate trial.   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 Clozapine workup 2. Consent from legal guardian when we have results of work up 3. Emergency use of Chlorpromazine until we initiate Clozapine 4. Continue close observation as patient is potentially dangerous.  12/13: Pt has some improvement in  episodes of agitation and disorganized behaviors since being started on clozapine, but she remains disorganized and delusional overall. We will continue to titrate dose of clozapine.  - Continueinpatient hospitalization  - Schizophrenia - Continuehaldol '5mg'$  TID - Change clozapine 12.'5mg'$  po BID to clozapine '25mg'$  BID - Continue haldol decanoate '150mg'$  IM q30 Days (last given11/30/18) - Continue depakote '500mg'$  BID - depakote level 50 on 06/09/17  - Dementia with behavioral disturbance r/o Alzhemier's versus cognitive disorder unspecified -ContinueAricept '5mg'$  PO QHS - SW teamresearching placement options      Physician Treatment Plan for Secondary Diagnosis: Principal Problem:   Schizophrenia ([place]) Active Problems:   Dementia with behavioral disturbance   Long Term Goal(s): Improvement in symptoms so as ready for discharge  Short Term Goals: Ability to maintain clinical measurements within  normal limits will improve Ability to demonstrate self-control will improve Ability to identify and develop effective coping behaviors will improve  Medication Management: Evaluate patient's response, side effects, and tolerance of medication regimen.  Therapeutic Interventions: 1 to 1 sessions, Unit Group sessions and Medication administration.  Evaluation of Outcomes: Progressing   RN Treatment Plan for Primary Diagnosis: Schizophrenia ([place]) Long Term Goal(s): Knowledge of disease and therapeutic regimen to maintain health will improve  Short Term Goals: Ability to identify and develop effective coping behaviors will improve and Compliance with prescribed medications will improve  Medication Management: RN will administer medications as ordered by provider, will assess and evaluate patient's response and provide education to patient for prescribed medication. RN will report any adverse and/or side effects to prescribing provider.  Therapeutic Interventions: 1 on 1 counseling sessions, Psychoeducation, Medication administration, Evaluate responses to treatment, Monitor vital signs and CBGs as ordered, Perform/monitor CIWA, COWS, AIMS and Fall Risk screenings as ordered, Perform wound care treatments as ordered.  Evaluation of Outcomes: Progressing   LCSW Treatment Plan for Primary Diagnosis: Schizophrenia ([place]) Long Term Goal(s): Safe transition to appropriate next level of care at discharge, Engage patient in therapeutic group addressing interpersonal concerns.  Short Term Goals: Engage patient in aftercare planning with referrals and resources  Therapeutic Interventions: Assess for all discharge needs, 1 to 1 time with Social worker, Explore available resources and support systems, Assess for adequacy in community support network, Educate family and significant other(s) on suicide prevention, Complete Psychosocial Assessment, Interpersonal group therapy.  Evaluation of Outcomes: Not  Met  Unsure of where pt will go at d/c.  CSW to talk to Mr Raynelle Dick about taking pt back 12/10:  Had been checking into locked memory care units based on dx of dementia, but current dr note suggests different course of treatment.  Dr R to make decision tomorrow based on notes of Dr IZ over weekend 12/13:  Have made 4 referrals to facilities that work with both mental; health and dementia-all denied-one because it was a SNF, 3 because of her behaviors.  Will continue to send information to potential facilities  Progress in Treatment: Attending groups: Yes Participating in groups: Yes Taking medication as prescribed: Yes Toleration medication: Yes, no side effects reported at this time Family/Significant other contact made: Yes  Guardian Patient understands diagnosis: No  Limited insight Discussing patient identified problems/goals with staff: Yes Medical problems stabilized or resolved: Yes Denies suicidal/homicidal ideation: Yes Issues/concerns per patient self-inventory: None Other: N/A  New problem(s) identified: None identified at this time.   New Short Term/Long Term Goal(s): None identified at this time.   Discharge Plan or Barriers:   Reason for Continuation of Hospitalization: Disorganization  Delusions  Medication stabilization   Estimated Length of Stay: 12/18  Attendees: Patient: 06/13/2017  10:31 AM  Physician: Maris Berger, MD 06/13/2017  10:31 AM  Nursing: Sena Hitch, RN 06/13/2017  10:31 AM  RN Care Manager: Lars Pinks, RN 06/13/2017  10:31 AM  Social Worker: Ripley Fraise 06/13/2017  10:31 AM  Recreational Therapist: Winfield Cunas 06/13/2017  10:31 AM  Other: Norberto Sorenson 06/13/2017  10:31 AM  Other:  06/13/2017  10:31 AM    Scribe for Treatment Team:  Roque Lias LCSW 06/13/2017 10:31 AM

## 2017-06-13 NOTE — Progress Notes (Signed)
Patient did not attend wrap up group tonight.

## 2017-06-13 NOTE — Progress Notes (Signed)
Recreation Therapy Notes  Date: 06/13/17 Time: 1000 Location: 500 Hall  Group Topic: Teambuilding  Goal Area(s) Addresses:  Patient will effectively work with peers in group.  Patient will verbalize benefit of healthy teambuilding. Patient will verbalize positive effect of healthy team building post d/c.  Patient will identify teambuilding techniques that made activity effective for group.   Intervention: Nurse, children's  Activity: Sharks in [employer].  Each patient was given a rubber disk and one extra disk for the group.  Patient were to maneuver each patient from one end of the hallway to the other using only the disks provided.  If any person stepped off their disk, the group would have to start over.  Education: Communication, Discharge Planning  Education Outcome: Acknowledges understanding/In group clarification offered/Needs additional education.   Clinical Observations/Feedback:  Pt did not attend group.    Victorino Sparrow, LRT/CTRS         Ria Comment, Jesilyn Easom A 06/13/2017 11:45 AM

## 2017-06-13 NOTE — Progress Notes (Signed)
[facility] MD Progress Note  06/13/2017 3:46 PM [name] Ball  MRN:  [id number]   Subjective:   [name] Ball is a 51 y/o F with history of schizophrenia vs bipolar with psychotic features who was admitted initially to ED with worsening symptoms of psychosis including inability to take care of herself,hallucinations,and wandering from her group home.She was started on haldol and abilify, but she continued to have agitation and disorganization, so she was transitioned off of abilify and onto olanzapine, but she did not have improvement, so she was changed to clozapine (in addition to haldol). Pt has been showing gradual improvement with titration of clozapine.   Today upon evaluation, pt is calm and cooperative. RN staff report she has been appropriate and has not been having any agitation. She remains generally disorganized and delusional. She denies SI/HI/AH/VH; however, she points out the window at the rest of the building and states that her children are attending school here. She endorses delusion that she is being asked to join "the sheriff and get back in the [employer] Pt has no specific concerns other than she would like to "buy a house for me and my sister and all our kids." Pt remains disoriented; she states current month is "September"  And current year is "thirteen." She correctly identifies her location as "[place]" which is an improvement from her previous replies.  Discussed with patient that we are still working towards plan of discharge to a group home, and she could work towards independent living from there, and she was in agreement with the plan.    Principal Problem: Schizophrenia ([place]) Diagnosis:   Patient Active Problem List   Diagnosis Date Noted  . Dementia with behavioral disturbance [F03.91] 06/04/2017  . Schizophrenia ([place]) [F20.9] 05/29/2017  . Bipolar disorder, current episode manic, severe with psychotic features ([place]) [F31.2] 04/03/2017  . Hepatitis C virus  infection without hepatic coma [B19.20]   . Bipolar disorder ([place]) [F31.9]   . GERD (gastroesophageal reflux disease) [K21.9]    Total Time spent with patient: 30 minutes  Past Psychiatric History: see H&P  Past Medical History:  Past Medical History:  Diagnosis Date  . Bipolar disorder ([place])   . GERD (gastroesophageal reflux disease)   . Hepatitis C virus infection without hepatic coma     Past Surgical History:  Procedure Laterality Date  . abdominal cyst removed    . CESAREAN SECTION    . COLON SURGERY     Family History: History reviewed. No pertinent family history. Family Psychiatric  History: see H&P Social History:  Social History   Substance and Sexual Activity  Alcohol Use No     Social History   Substance and Sexual Activity  Drug Use No    Social History   Socioeconomic History  . Marital status: Unknown    Spouse name: None  . Number of children: None  . Years of education: None  . Highest education level: None  Social Needs  . Financial resource strain: None  . Food insecurity - worry: None  . Food insecurity - inability: None  . Transportation needs - medical: None  . Transportation needs - non-medical: None  Occupational History  . None  Tobacco Use  . Smoking status: Current Every Day Smoker    Packs/day: 1.00    Types: Cigarettes  . Smokeless tobacco: Never Used  Substance and Sexual Activity  . Alcohol use: No  . Drug use: No  . Sexual activity: None  Other Topics Concern  .  None  Social History Narrative  . None   Additional Social History:                         Sleep: Good  Appetite:  Good  Current Medications: Current Facility-Administered Medications  Medication Dose Route Frequency Provider Last Rate Last Dose  . acetaminophen (TYLENOL) tablet 650 mg  650 mg Oral Q6H PRN Niel Hummer, NP   650 mg at 06/12/17 1907  . alum & mag hydroxide-simeth (MAALOX/MYLANTA) 200-200-20 MG/5ML suspension 30 mL  30 mL  Oral Q4H PRN Elmarie Shiley A, NP      . chlorproMAZINE (THORAZINE) injection 100 mg  100 mg Intramuscular TID PRN Izediuno, Laruth Bouchard, MD      . chlorproMAZINE (THORAZINE) tablet 100 mg  100 mg Oral TID PRN Artist Beach, MD   100 mg at 06/11/17 2101  . cloZAPine (CLOZARIL) tablet 50 mg  50 mg Oral BID Pennelope Bracken, MD   50 mg at 06/13/17 1011  . diltiazem (CARDIZEM CD) 24 hr capsule 120 mg  120 mg Oral Daily Elmarie Shiley A, NP   120 mg at 06/13/17 1012  . divalproex (DEPAKOTE) DR tablet 500 mg  500 mg Oral BID Elmarie Shiley A, NP   500 mg at 06/13/17 1011  . haloperidol (HALDOL) tablet 5 mg  5 mg Oral TID Pennelope Bracken, MD   5 mg at 06/13/17 1155  . haloperidol decanoate (HALDOL DECANOATE) 100 MG/ML injection 150 mg  150 mg Intramuscular Q30 days Pennelope Bracken, MD   150 mg at 05/31/17 1213  . LORazepam (ATIVAN) tablet 1 mg  1 mg Oral Q6H PRN Niel Hummer, NP   1 mg at 06/12/17 1908  . magnesium hydroxide (MILK OF MAGNESIA) suspension 30 mL  30 mL Oral Daily PRN Elmarie Shiley A, NP      . nicotine (NICODERM CQ - dosed in mg/24 hours) patch 14 mg  14 mg Transdermal Daily Derrill Center, NP   14 mg at 06/11/17 0752  . ondansetron (ZOFRAN) tablet 4 mg  4 mg Oral Q8H PRN Niel Hummer, NP        Lab Results: No results found for this or any previous visit (from the past 48 hour(s)).  Blood Alcohol level:  Lab Results  Component Value Date   [facility] <5 05/23/2017   ETH <5 03/50/0938    Metabolic Disorder Labs: Lab Results  Component Value Date   HGBA1C 4.6 (L) 04/05/2017   MPG 85.32 04/05/2017   Lab Results  Component Value Date   PROLACTIN 50.6 (H) 04/05/2017   Lab Results  Component Value Date   CHOL 130 04/05/2017   TRIG 51 04/05/2017   HDL 57 04/05/2017   CHOLHDL 2.3 04/05/2017   VLDL 10 04/05/2017   LDLCALC 63 04/05/2017   LDLCALC 89 05/03/2012    Physical Findings: AIMS: Facial and Oral Movements Muscles of Facial Expression: None,  normal Lips and Perioral Area: None, normal Jaw: None, normal Tongue: None, normal,Extremity Movements Upper (arms, wrists, hands, fingers): None, normal Lower (legs, knees, ankles, toes): None, normal, Trunk Movements Neck, shoulders, hips: None, normal, Overall Severity Severity of abnormal movements (highest score from questions above): None, normal Incapacitation due to abnormal movements: None, normal Patient's awareness of abnormal movements (rate only patient's report): No Awareness, Dental Status Current problems with teeth and/or dentures?: No Does patient usually wear dentures?: No  CIWA:  CIWA-Ar Total: 1  COWS:  COWS Total Score: 3  Musculoskeletal: Strength & Muscle Tone: within normal limits Gait & Station: normal Patient leans: N/A  Psychiatric Specialty Exam: Physical Exam  Nursing note and vitals reviewed.   Review of Systems  Constitutional: Negative for chills and fever.  Respiratory: Negative for cough.   Cardiovascular: Negative for chest pain.  Gastrointestinal: Negative for heartburn and nausea.  Psychiatric/Behavioral: Positive for hallucinations. Negative for depression and suicidal ideas. The patient is nervous/anxious.     Blood pressure 129/82, pulse (!) 107, temperature 98.7 F (37.1 C), temperature source Oral, resp. rate 18, height 5\' 3"  (1.6 m), weight 73.9 kg (163 lb), SpO2 100 %.Body mass index is 28.87 kg/m.  General Appearance: Casual and Fairly Groomed  Eye Contact:  Good  Speech:  Garbled and Normal Rate  Volume:  Normal  Mood:  Euthymic  Affect:  Appropriate, Blunt and Congruent  Thought Process:  Disorganized and Descriptions of Associations: Loose  Orientation:  Other:  oriented to city, not oriented to year/month  Thought Content:  Delusions and Hallucinations: Visual  Suicidal Thoughts:  No  Homicidal Thoughts:  No  Memory:  Immediate;   Poor Recent;   Poor Remote;   Poor  Judgement:  Intact  Insight:  Fair  Psychomotor  Activity:  Normal  Concentration:  Concentration: Fair  Recall:  [employer] of Knowledge:  Fair  Language:  Fair  Akathisia:  No  Handed:    AIMS (if indicated):     Assets:  Communication Skills Resilience Social Support  ADL's:  Intact  Cognition:  WNL  Sleep:  Number of Hours: 6.25     Treatment Plan Summary: Daily contact with patient to assess and evaluate symptoms and progress in treatment and Medication management. Pt is showing improvement of her agitation, but she remains disorganized, disoriented, and delusional. We will continue to titrate dose of clozapine and search for appropriate group home placement.   - Continueinpatient hospitalization  - Schizophrenia - Continuehaldol 5mg  TID - Change clozapine 25mg  BID to clozapine 50mg  BID (plan to titrate up by 50mg /day until total daily dose of 300mg  on 06/17/17) - Continue haldol decanoate 150mg  IM q30 Days (last given11/30/18) - Continue depakote 500mg  BID - depakote level =  50 on 06/09/17   - Dementia with behavioral disturbance r/o Alzhemier's versus cognitive disorder unspecified -ContinueAricept 5mg  PO QHS - SW teamresearching placement options  -encourage participation in groups and therapeutic milieu -Discharge planning will be ongoing     Pennelope Bracken, MD 06/13/2017, 3:46 PM

## 2017-06-14 NOTE — Progress Notes (Signed)
Dar Note: Patient presents with calm affect and pleasant mood.  Patient in her room most of this shift.  Denies auditory and visual hallucinations.  Medications given as prescribed.  Described energy level as normal and concentration as good.  Routine safety checks maintained.  Patient is safe on the unit.

## 2017-06-14 NOTE — Progress Notes (Signed)
Recreation Therapy Notes  Date: 06/14/14 Time: 1000 Location: 500 Hall Dayroom  Group Topic: Wellness  Goal Area(s) Addresses:  Patient will define components of whole wellness. Patient will verbalize benefit of whole wellness.  Intervention:  Chairs, small beach ball  Activity: Keep It Going Volleyball.  Patients were seated in a circle in the dayroom.  Patients were to pass the ball back and forth to each other.  Patients could bounce the balls off the floor but the ball could not come to a complete stop.  LRT counted the number of hits the patients made on the ball.  If the ball came to a stop, the count would start over.    Education: Wellness, Dentist.   Education Outcome: Acknowledges education/In group clarification offered/Needs additional education.   Clinical Observations/Feedback: Pt did not attend group.    Victorino Sparrow, LRT/CTRS         Victorino Sparrow A 06/14/2017 11:24 AM

## 2017-06-14 NOTE — Progress Notes (Signed)
[facility] MD Progress Note  06/14/2017 12:33 PM [name] Ball  MRN:  [id number] Subjective:   [name] Ball is a 51 y/o F with history of schizophrenia vs bipolar with psychotic features who was admitted initially to ED with worsening symptoms of psychosis including inability to take care of herself,hallucinations,and wandering from her group home.She was started on home medications but had ongoing agitation and eventually she was changed to clozapine (in addition to previous home medication of haldol). Pt has been showing gradual improvement with titration of clozapine and having less agitation.  Today upon evaluation, when pt is asked how she is doing, she replies, "I don't know - I'm okay." Pt denies SI/HI/AH/VH. She reports she is sleeping well and her appetite is good. She was able to shower yesterday and her hygiene has improved over the past few days. She reports that she is tolerating clozapine without difficulty or side effects. When asked about confusion and disorientation, she replies, "I feel less disoriented." Pt has improvement of her orientation as well. She confirms that she is [place] today. She states the year is "Twenty Five" and she confirms that she means 2025. Pt was re-oriented. She has no other present concerns. Discussed with patient that we will continue to titrate her dose of clozapine up over the weekend and we will continue to look into possible group home placement options. Pt was in agreement with this plan and she had no further questions, comments, or concerns.    Principal Problem: Schizophrenia ([place]) Diagnosis:   Patient Active Problem List   Diagnosis Date Noted  . Dementia with behavioral disturbance [F03.91] 06/04/2017  . Schizophrenia ([place]) [F20.9] 05/29/2017  . Bipolar disorder, current episode manic, severe with psychotic features ([place]) [F31.2] 04/03/2017  . Hepatitis C virus infection without hepatic coma [B19.20]   . Bipolar disorder ([place]) [F31.9]    . GERD (gastroesophageal reflux disease) [K21.9]    Total Time spent with patient: 30 minutes  Past Psychiatric History: see H&P  Past Medical History:  Past Medical History:  Diagnosis Date  . Bipolar disorder ([place])   . GERD (gastroesophageal reflux disease)   . Hepatitis C virus infection without hepatic coma     Past Surgical History:  Procedure Laterality Date  . abdominal cyst removed    . CESAREAN SECTION    . COLON SURGERY     Family History: History reviewed. No pertinent family history. Family Psychiatric  History: see H&P Social History:  Social History   Substance and Sexual Activity  Alcohol Use No     Social History   Substance and Sexual Activity  Drug Use No    Social History   Socioeconomic History  . Marital status: Unknown    Spouse name: None  . Number of children: None  . Years of education: None  . Highest education level: None  Social Needs  . Financial resource strain: None  . Food insecurity - worry: None  . Food insecurity - inability: None  . Transportation needs - medical: None  . Transportation needs - non-medical: None  Occupational History  . None  Tobacco Use  . Smoking status: Current Every Day Smoker    Packs/day: 1.00    Types: Cigarettes  . Smokeless tobacco: Never Used  Substance and Sexual Activity  . Alcohol use: No  . Drug use: No  . Sexual activity: None  Other Topics Concern  . None  Social History Narrative  . None   Additional Social History:  Sleep: Good  Appetite:  Good  Current Medications: Current Facility-Administered Medications  Medication Dose Route Frequency Provider Last Rate Last Dose  . acetaminophen (TYLENOL) tablet 650 mg  650 mg Oral Q6H PRN Niel Hummer, NP   650 mg at 06/12/17 1907  . alum & mag hydroxide-simeth (MAALOX/MYLANTA) 200-200-20 MG/5ML suspension 30 mL  30 mL Oral Q4H PRN Elmarie Shiley A, NP      . chlorproMAZINE (THORAZINE) injection 100  mg  100 mg Intramuscular TID PRN Izediuno, Laruth Bouchard, MD      . chlorproMAZINE (THORAZINE) tablet 100 mg  100 mg Oral TID PRN Artist Beach, MD   100 mg at 06/11/17 2101  . [START ON 06/15/2017] cloZAPine (CLOZARIL) tablet 100 mg  100 mg Oral BID Pennelope Bracken, MD      . Derrill Memo ON 06/17/2017] cloZAPine (CLOZARIL) tablet 100 mg  100 mg Oral Rockne Menghini, MD       And  . Derrill Memo ON 06/17/2017] cloZAPine (CLOZARIL) tablet 200 mg  200 mg Oral QHS Pennelope Bracken, MD      . Derrill Memo ON 06/16/2017] cloZAPine (CLOZARIL) tablet 125 mg  125 mg Oral BID Pennelope Bracken, MD      . cloZAPine (CLOZARIL) tablet 50 mg  50 mg Oral BID Pennelope Bracken, MD   50 mg at 06/14/17 1128  . cloZAPine (CLOZARIL) tablet 75 mg  75 mg Oral BID Pennelope Bracken, MD   75 mg at 06/14/17 1129  . diltiazem (CARDIZEM CD) 24 hr capsule 120 mg  120 mg Oral Daily Elmarie Shiley A, NP   120 mg at 06/14/17 1130  . divalproex (DEPAKOTE) DR tablet 500 mg  500 mg Oral BID Elmarie Shiley A, NP   500 mg at 06/14/17 1130  . haloperidol (HALDOL) tablet 5 mg  5 mg Oral TID Pennelope Bracken, MD   5 mg at 06/14/17 1129  . haloperidol decanoate (HALDOL DECANOATE) 100 MG/ML injection 150 mg  150 mg Intramuscular Q30 days Pennelope Bracken, MD   150 mg at 05/31/17 1213  . LORazepam (ATIVAN) tablet 1 mg  1 mg Oral Q6H PRN Niel Hummer, NP   1 mg at 06/13/17 2100  . magnesium hydroxide (MILK OF MAGNESIA) suspension 30 mL  30 mL Oral Daily PRN Elmarie Shiley A, NP      . nicotine (NICODERM CQ - dosed in mg/24 hours) patch 14 mg  14 mg Transdermal Daily Derrill Center, NP   14 mg at 06/11/17 0752  . ondansetron (ZOFRAN) tablet 4 mg  4 mg Oral Q8H PRN Niel Hummer, NP        Lab Results: No results found for this or any previous visit (from the past 48 hour(s)).  Blood Alcohol level:  Lab Results  Component Value Date   [facility] <5 05/23/2017   ETH <5 69/62/9528     Metabolic Disorder Labs: Lab Results  Component Value Date   HGBA1C 4.6 (L) 04/05/2017   MPG 85.32 04/05/2017   Lab Results  Component Value Date   PROLACTIN 50.6 (H) 04/05/2017   Lab Results  Component Value Date   CHOL 130 04/05/2017   TRIG 51 04/05/2017   HDL 57 04/05/2017   CHOLHDL 2.3 04/05/2017   VLDL 10 04/05/2017   LDLCALC 63 04/05/2017   LDLCALC 89 05/03/2012    Physical Findings: AIMS: Facial and Oral Movements Muscles of Facial Expression: None, normal Lips and Perioral Area: None,  normal Jaw: None, normal Tongue: None, normal,Extremity Movements Upper (arms, wrists, hands, fingers): None, normal Lower (legs, knees, ankles, toes): None, normal, Trunk Movements Neck, shoulders, hips: None, normal, Overall Severity Severity of abnormal movements (highest score from questions above): None, normal Incapacitation due to abnormal movements: None, normal Patient's awareness of abnormal movements (rate only patient's report): No Awareness, Dental Status Current problems with teeth and/or dentures?: No Does patient usually wear dentures?: No  CIWA:  CIWA-Ar Total: 1 COWS:  COWS Total Score: 3  Musculoskeletal: Strength & Muscle Tone: within normal limits Gait & Station: normal Patient leans: N/A  Psychiatric Specialty Exam: Physical Exam  Nursing note and vitals reviewed.   Review of Systems  Constitutional: Negative for chills and fever.  Respiratory: Negative for cough.   Cardiovascular: Negative for chest pain.  Gastrointestinal: Negative for abdominal pain, heartburn, nausea and vomiting.  Psychiatric/Behavioral: Negative for depression, hallucinations and suicidal ideas. The patient is not nervous/anxious.     Blood pressure (!) 143/88, pulse (!) 118, temperature 97.8 F (36.6 C), resp. rate 18, height 5\' 3"  (1.6 m), weight 73.9 kg (163 lb), SpO2 100 %.Body mass index is 28.87 kg/m.  General Appearance: Casual and Fairly Groomed  Eye Contact:   Good  Speech:  Clear and Coherent and Normal Rate  Volume:  Normal  Mood:  Euthymic  Affect:  Appropriate, Congruent and Flat  Thought Process:  Coherent, Goal Directed and Descriptions of Associations: Loose  Orientation:  Other:  oriented to self, to city, but she is not oriented to date (states month is "September" and  year is "2025"  Thought Content:  Delusions  Suicidal Thoughts:  No  Homicidal Thoughts:  No  Memory:  Immediate;   Poor Recent;   Poor Remote;   Poor  Judgement:  Fair  Insight:  Fair  Psychomotor Activity:  Normal  Concentration:  Concentration: Fair  Recall:  [employer] of Knowledge:  Fair  Language:  Fair  Akathisia:  No  Handed:    AIMS (if indicated):     Assets:  [employer]ed forces logistics/support/administrative officer Physical Health Resilience Social Support  ADL's:  Intact  Cognition:  WNL  Sleep:  Number of Hours: 6     Treatment Plan Summary: Daily contact with patient to assess and evaluate symptoms and progress in treatment and Medication management. Pt is having incremental improvement of agitation and intrusive behaviors with titration of clozapine dose, but she remains delusional, disorganized, and disoriented. She requires group home placement, and SW team is looking for alternative options for placement. We will continue to titrate dose of clozapine over the weekend.   - Continueinpatient hospitalization  - Schizophrenia - Continuehaldol 5mg  TID -Change clozapine 50mg  BID to clozapine 75mg  BID (plan to titrate up by 50mg /day until total daily dose of 300mg  on 06/17/17) - Continue haldol decanoate 150mg  IM q30 Days (last given11/30/18) - Continue depakote 500mg  BID - depakote level =  50 on 06/09/17   - Dementia with behavioral disturbance r/o Alzhemier's versus cognitive disorder unspecified - SW teamresearching placement options  -encourage participation in groups and  therapeutic milieu -Discharge planning will be ongoing    Pennelope Bracken, MD 06/14/2017, 12:33 PM
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
## 2017-06-15 MED ORDER — CLOZAPINE 25 MG PO TABS: 75.0000 mg | ORAL_TABLET | Freq: Two times a day (BID) | ORAL | Status: DC

## 2017-06-15 MED ORDER — CLOZAPINE 25 MG PO TABS
50.0000 mg | ORAL_TABLET | Freq: Two times a day (BID) | ORAL | Status: AC
  Administered 2017-06-16 (×2): 50 mg via ORAL
  Filled 2017-06-15 (×2): qty 2

## 2017-06-15 NOTE — [facility] Group Notes (Signed)
[place] Group Notes: (Clinical Social Work)   06/15/2017      Type of Therapy:  Group Therapy   Participation Level:  Did Not Attend despite MHT prompting   Selmer Dominion, LCSW 06/15/2017, 1:25 PM

## 2017-06-15 NOTE — Progress Notes (Signed)
D Patient is observed UAL on the 500 hall. She is tolerating this fairly well. No emotional outbursts, labile behaviors, refusal to take meds observed this shift. SHe did refuse to answer daily assessment questions. She did take all of her scheduled medicaitons per MD order. AHer emotional status is status quo: she is easily directed. She isol;ates to her room when she is not eating and /or needing something from the staff. SHe frequently mumbles to herslef, does not present with any signs / symptoms of adverse reations to current medications. Affect remains flat, but cooperative. Depressed. R Safety in place.

## 2017-06-15 NOTE — Progress Notes (Signed)
[facility] MD Progress Note  06/15/2017 1:33 PM [name] Ball  MRN:  [id number] Subjective:  [name] Ball reports " I don't feel to good, It started yesterday. I am feeling dizzy."   Objective: [name] Ball seen resting in bedroom. Patient isolate to the room for most of the day. Reports not feeling well and states I feel like I am going to pass out sometimes when I get up. Denies suicidal or homicidal ideation. Denies auditory or visual hallucination. Patient reports missing her family and feels ready to go home. Discussed decreasing medications due to side effects.  Will continue to monitor Clozaril titration. Patient present flat, blunted and depressed. Support, encouragement and reassurance was provided.    Principal Problem: Schizophrenia ([place]) Diagnosis:   Patient Active Problem List   Diagnosis Date Noted  . Dementia with behavioral disturbance [F03.91] 06/04/2017  . Schizophrenia ([place]) [F20.9] 05/29/2017  . Bipolar disorder, current episode manic, severe with psychotic features ([place]) [F31.2] 04/03/2017  . Hepatitis C virus infection without hepatic coma [B19.20]   . Bipolar disorder ([place]) [F31.9]   . GERD (gastroesophageal reflux disease) [K21.9]    Total Time spent with patient: 30 minutes  Past Psychiatric History: see H&P  Past Medical History:  Past Medical History:  Diagnosis Date  . Bipolar disorder ([place])   . GERD (gastroesophageal reflux disease)   . Hepatitis C virus infection without hepatic coma     Past Surgical History:  Procedure Laterality Date  . abdominal cyst removed    . CESAREAN SECTION    . COLON SURGERY     Family History: History reviewed. No pertinent family history. Family Psychiatric  History: see H&P Social History:  Social History   Substance and Sexual Activity  Alcohol Use No     Social History   Substance and Sexual Activity  Drug Use No    Social History   Socioeconomic History  . Marital status: Unknown    Spouse name: None  .  Number of children: None  . Years of education: None  . Highest education level: None  Social Needs  . Financial resource strain: None  . Food insecurity - worry: None  . Food insecurity - inability: None  . Transportation needs - medical: None  . Transportation needs - non-medical: None  Occupational History  . None  Tobacco Use  . Smoking status: Current Every Day Smoker    Packs/day: 1.00    Types: Cigarettes  . Smokeless tobacco: Never Used  Substance and Sexual Activity  . Alcohol use: No  . Drug use: No  . Sexual activity: None  Other Topics Concern  . None  Social History Narrative  . None   Additional Social History:                         Sleep: Good  Appetite:  Good  Current Medications: Current Facility-Administered Medications  Medication Dose Route Frequency Provider Last Rate Last Dose  . acetaminophen (TYLENOL) tablet 650 mg  650 mg Oral Q6H PRN Niel Hummer, NP   650 mg at 06/12/17 1907  . alum & mag hydroxide-simeth (MAALOX/MYLANTA) 200-200-20 MG/5ML suspension 30 mL  30 mL Oral Q4H PRN Elmarie Shiley A, NP      . chlorproMAZINE (THORAZINE) injection 100 mg  100 mg Intramuscular TID PRN Izediuno, Laruth Bouchard, MD      . chlorproMAZINE (THORAZINE) tablet 100 mg  100 mg Oral TID PRN Izediuno, Laruth Bouchard, MD   100  mg at 06/11/17 2101  . cloZAPine (CLOZARIL) tablet 100 mg  100 mg Oral BID Pennelope Bracken, MD   100 mg at 06/15/17 1203  . [START ON 06/17/2017] cloZAPine (CLOZARIL) tablet 100 mg  100 mg Oral Rockne Menghini, MD       And  . Derrill Memo ON 06/17/2017] cloZAPine (CLOZARIL) tablet 200 mg  200 mg Oral QHS Pennelope Bracken, MD      . Derrill Memo ON 06/16/2017] cloZAPine (CLOZARIL) tablet 125 mg  125 mg Oral BID Pennelope Bracken, MD      . diltiazem (CARDIZEM CD) 24 hr capsule 120 mg  120 mg Oral Daily Elmarie Shiley A, NP   120 mg at 06/15/17 1204  . divalproex (DEPAKOTE) DR tablet 500 mg  500 mg Oral BID Elmarie Shiley A, NP   500 mg at 06/15/17 1204  . haloperidol (HALDOL) tablet 5 mg  5 mg Oral TID Pennelope Bracken, MD   5 mg at 06/15/17 1204  . haloperidol decanoate (HALDOL DECANOATE) 100 MG/ML injection 150 mg  150 mg Intramuscular Q30 days Pennelope Bracken, MD   150 mg at 05/31/17 1213  . LORazepam (ATIVAN) tablet 1 mg  1 mg Oral Q6H PRN Niel Hummer, NP   1 mg at 06/14/17 2056  . magnesium hydroxide (MILK OF MAGNESIA) suspension 30 mL  30 mL Oral Daily PRN Elmarie Shiley A, NP      . nicotine (NICODERM CQ - dosed in mg/24 hours) patch 14 mg  14 mg Transdermal Daily Derrill Center, NP   14 mg at 06/15/17 1204  . ondansetron (ZOFRAN) tablet 4 mg  4 mg Oral Q8H PRN Niel Hummer, NP        Lab Results: No results found for this or any previous visit (from the past 48 hour(s)).  Blood Alcohol level:  Lab Results  Component Value Date   [facility] <5 05/23/2017   ETH <5 02/12/4817    Metabolic Disorder Labs: Lab Results  Component Value Date   HGBA1C 4.6 (L) 04/05/2017   MPG 85.32 04/05/2017   Lab Results  Component Value Date   PROLACTIN 50.6 (H) 04/05/2017   Lab Results  Component Value Date   CHOL 130 04/05/2017   TRIG 51 04/05/2017   HDL 57 04/05/2017   CHOLHDL 2.3 04/05/2017   VLDL 10 04/05/2017   LDLCALC 63 04/05/2017   LDLCALC 89 05/03/2012    Physical Findings: AIMS: Facial and Oral Movements Muscles of Facial Expression: None, normal Lips and Perioral Area: None, normal Jaw: None, normal Tongue: None, normal,Extremity Movements Upper (arms, wrists, hands, fingers): None, normal Lower (legs, knees, ankles, toes): None, normal, Trunk Movements Neck, shoulders, hips: None, normal, Overall Severity Severity of abnormal movements (highest score from questions above): None, normal Incapacitation due to abnormal movements: None, normal Patient's awareness of abnormal movements (rate only patient's report): No Awareness, Dental Status Current problems with teeth  and/or dentures?: No Does patient usually wear dentures?: No  CIWA:  CIWA-Ar Total: 1 COWS:  COWS Total Score: 3  Musculoskeletal: Strength & Muscle Tone: within normal limits Gait & Station: normal Patient leans: N/A  Psychiatric Specialty Exam: Physical Exam  Nursing note and vitals reviewed. Constitutional: She is oriented to person, place, and time. She appears well-developed.  Cardiovascular: Normal rate.  Neurological: She is alert and oriented to person, place, and time.  Psychiatric: She has a normal mood and affect. Her behavior is normal.  Review of Systems  Psychiatric/Behavioral: Negative for depression, hallucinations and suicidal ideas. The patient is not nervous/anxious.   All other systems reviewed and are negative.   Blood pressure 131/90, pulse (!) 120, temperature 97.8 F (36.6 C), resp. rate 18, height 5\' 3"  (1.6 m), weight 73.9 kg (163 lb), SpO2 100 %.Body mass index is 28.87 kg/m.  General Appearance: Casual and Fairly Groomed  Eye Contact:  Good  Speech:  Clear and Coherent and Normal Rate  Volume:  Normal  Mood:  Euthymic  Affect:  Appropriate, Congruent and Flat  Thought Process:  Coherent, Goal Directed and Descriptions of Associations: Loose  Orientation:  Other:  oriented to self and place  Thought Content:  Delusions  Suicidal Thoughts:  No  Homicidal Thoughts:  No  Memory:  Immediate;   Poor Recent;   Poor Remote;   Poor  Judgement:  Fair  Insight:  Fair  Psychomotor Activity:  Normal  Concentration:  Concentration: Fair  Recall:  [employer] of Knowledge:  Fair  Language:  Fair  Akathisia:  No  Handed:    AIMS (if indicated):     Assets:  Armed forces logistics/support/administrative officer Physical Health Resilience Social Support  ADL's:  Intact  Cognition:  WNL  Sleep:  Number of Hours: 6.25     Treatment Plan Summary: Daily contact with patient to assess and evaluate symptoms and progress in treatment and Medication management.   - Continue with  current with treatment plan on 06/15/2017 except where noted    - Schizophrenia - Continuehaldol 5mg  TID - Continue clozapine 50 mg BID held triration due to reported side effects of dizziness.  (plan to titrate up by 50mg /day until total daily dose of 300mg  on 06/17/17) - Continue haldol decanoate 150mg  IM q30 Days (last given11/30/18) - Continue depakote 500mg  BID - depakote level =  50 on 06/09/17   - Dementia with behavioral disturbance r/o Alzhemier's versus cognitive disorder unspecified - SW teamresearching placement options  -encourage participation in groups and therapeutic milieu -Discharge planning will be ongoing   Derrill Center, NP 06/15/2017, 1:33 PM   Agree with NP Progress Note

## 2017-06-15 NOTE — Progress Notes (Signed)
Writer has observed patient up and restless tonight. She has attended group and participated. She came to Probation officer and requested something to help her rest. When Probation officer asked how her day had been she reported that "my  feelings have been all over the place." She asked for medication so that she could rest and wanted to lie down. She received ativan and returned to her room to rest. Her conversation is tangential and disorganized. Support given and safety maintained on unit with 15 min checks.

## 2017-06-15 NOTE — B[facility] Group Notes (Signed)
Adult Psychoeducational Group Note  Date:  06/15/2017 Time:  8:47 PM  Group Topic/Focus:  Wrap-Up Group:   The focus of this group is to help patients review their daily goal of treatment and discuss progress on daily workbooks.  Participation Level:  Active  Participation Quality:  Appropriate  Affect:  Appropriate  Cognitive:  Alert and Appropriate  Insight: Appropriate  Engagement in Group:  Engaged  Modes of Intervention:  discussion  Additional Comments:  Pt was attentive and appropriate during tonight's group discussion. Pt shared that she under the weather and talked about taking it easy and that she is better now due to medication.   Theodoro Grist D 06/15/2017, 8:47 PM

## 2017-06-16 LAB — VALPROIC ACID LEVEL: Valproic Acid Lvl: 58 ug/mL (ref 50.0–100.0)

## 2017-06-16 NOTE — Progress Notes (Signed)
Patient has been up and active on the unit, attended group this evening and participated.She c/o drooling tonight when she got medication to help with anxiety. Patient has been pleasant and cooperative. Support and encouragement offered, safety maintained on unit, will continue to monitor.

## 2017-06-16 NOTE — B[facility] Group Notes (Signed)
[facility] Group Notes: (Clinical Social Work)   06/16/2017      Type of Therapy:  Group Therapy   Participation Level:  Did Not Attend despite MHT prompting   Selmer Dominion, LCSW 06/16/2017, 12:26 PM

## 2017-06-16 NOTE — Progress Notes (Signed)
[facility] MD Progress Note  06/16/2017 1:40 PM [name] Ball  MRN:  [id number] Subjective:  [name] Ball reports " I am feeling alright"  Objective: [name] Ball seen resting in bed. Patient continues to isolate to room. Reports feeling better than yesterday. States taken medication as tolerated. CBC and Clozaril still pending results. Valproic level 58. Reports her dizziness has resolved. Denies suicidal or homicidal ideation. Denies auditory or visual hallucination.  Support, encouragement and reassurance was provided.    Principal Problem: Schizophrenia ([place]) Diagnosis:   Patient Active Problem List   Diagnosis Date Noted  . Dementia with behavioral disturbance [F03.91] 06/04/2017  . Schizophrenia ([place]) [F20.9] 05/29/2017  . Bipolar disorder, current episode manic, severe with psychotic features ([place]) [F31.2] 04/03/2017  . Hepatitis C virus infection without hepatic coma [B19.20]   . Bipolar disorder ([place]) [F31.9]   . GERD (gastroesophageal reflux disease) [K21.9]    Total Time spent with patient: 30 minutes  Past Psychiatric History: see H&P  Past Medical History:  Past Medical History:  Diagnosis Date  . Bipolar disorder ([name])   . GERD (gastroesophageal reflux disease)   . Hepatitis C virus infection without hepatic coma     Past Surgical History:  Procedure Laterality Date  . abdominal cyst removed    . CESAREAN SECTION    . COLON SURGERY     Family History: History reviewed. No pertinent family history. Family Psychiatric  History: see H&P Social History:  Social History   Substance and Sexual Activity  Alcohol Use No     Social History   Substance and Sexual Activity  Drug Use No    Social History   Socioeconomic History  . Marital status: Unknown    Spouse name: None  . Number of children: None  . Years of education: None  . Highest education level: None  Social Needs  . Financial resource strain: None  . Food insecurity - worry: None  . Food  insecurity - inability: None  . Transportation needs - medical: None  . Transportation needs - non-medical: None  Occupational History  . None  Tobacco Use  . Smoking status: Current Every Day Smoker    Packs/day: 1.00    Types: Cigarettes  . Smokeless tobacco: Never Used  Substance and Sexual Activity  . Alcohol use: No  . Drug use: No  . Sexual activity: None  Other Topics Concern  . None  Social History Narrative  . None   Additional Social History:                         Sleep: Good  Appetite:  Good  Current Medications: Current Facility-Administered Medications  Medication Dose Route Frequency Provider Last Rate Last Dose  . acetaminophen (TYLENOL) tablet 650 mg  650 mg Oral Q6H PRN Niel Hummer, NP   650 mg at 06/12/17 1907  . alum & mag hydroxide-simeth (MAALOX/MYLANTA) 200-200-20 MG/5ML suspension 30 mL  30 mL Oral Q4H PRN Elmarie Shiley A, NP      . chlorproMAZINE (THORAZINE) injection 100 mg  100 mg Intramuscular TID PRN Izediuno, Laruth Bouchard, MD      . chlorproMAZINE (THORAZINE) tablet 100 mg  100 mg Oral TID PRN Artist Beach, MD   100 mg at 06/11/17 2101  . [START ON 06/17/2017] cloZAPine (CLOZARIL) tablet 100 mg  100 mg Oral Rockne Menghini, MD       And  . Derrill Memo ON 06/17/2017] cloZAPine (CLOZARIL) tablet 200  mg  200 mg Oral QHS Pennelope Bracken, MD      . cloZAPine (CLOZARIL) tablet 50 mg  50 mg Oral BID Derrill Center, NP   50 mg at 06/16/17 0950  . diltiazem (CARDIZEM CD) 24 hr capsule 120 mg  120 mg Oral Daily Elmarie Shiley A, NP   120 mg at 06/16/17 0949  . divalproex (DEPAKOTE) DR tablet 500 mg  500 mg Oral BID Elmarie Shiley A, NP   500 mg at 06/16/17 8937  . haloperidol (HALDOL) tablet 5 mg  5 mg Oral TID Pennelope Bracken, MD   5 mg at 06/16/17 1319  . haloperidol decanoate (HALDOL DECANOATE) 100 MG/ML injection 150 mg  150 mg Intramuscular Q30 days Pennelope Bracken, MD   150 mg at 05/31/17 1213  .  LORazepam (ATIVAN) tablet 1 mg  1 mg Oral Q6H PRN Niel Hummer, NP   1 mg at 06/15/17 2036  . magnesium hydroxide (MILK OF MAGNESIA) suspension 30 mL  30 mL Oral Daily PRN Elmarie Shiley A, NP      . nicotine (NICODERM CQ - dosed in mg/24 hours) patch 14 mg  14 mg Transdermal Daily Derrill Center, NP   14 mg at 06/16/17 0948  . ondansetron (ZOFRAN) tablet 4 mg  4 mg Oral Q8H PRN Niel Hummer, NP        Lab Results:  Results for orders placed or performed during the hospital encounter of 05/29/17 (from the past 48 hour(s))  Valproic acid level     Status: None   Collection Time: 06/16/17  6:50 AM  Result Value Ref Range   Valproic Acid Lvl 58 50.0 - 100.0 ug/mL    Comment: Performed at [facility], [place] [address] [place], [place] [address]    Blood Alcohol level:  Lab Results  Component Value Date   [facility] <5 05/23/2017   ETH <5 68/05/5725    Metabolic Disorder Labs: Lab Results  Component Value Date   HGBA1C 4.6 (L) 04/05/2017   MPG 85.32 04/05/2017   Lab Results  Component Value Date   PROLACTIN 50.6 (H) 04/05/2017   Lab Results  Component Value Date   CHOL 130 04/05/2017   TRIG 51 04/05/2017   HDL 57 04/05/2017   CHOLHDL 2.3 04/05/2017   VLDL 10 04/05/2017   LDLCALC 63 04/05/2017   LDLCALC 89 05/03/2012    Physical Findings: AIMS: Facial and Oral Movements Muscles of Facial Expression: None, normal Lips and Perioral Area: None, normal Jaw: None, normal Tongue: None, normal,Extremity Movements Upper (arms, wrists, hands, fingers): None, normal Lower (legs, knees, ankles, toes): None, normal, Trunk Movements Neck, shoulders, hips: None, normal, Overall Severity Severity of abnormal movements (highest score from questions above): None, normal Incapacitation due to abnormal movements: None, normal Patient's awareness of abnormal movements (rate only patient's report): No Awareness, Dental Status Current problems with teeth and/or dentures?:  No Does patient usually wear dentures?: No  CIWA:  CIWA-Ar Total: 1 COWS:  COWS Total Score: 3  Musculoskeletal: Strength & Muscle Tone: within normal limits Gait & Station: normal Patient leans: N/A  Psychiatric Specialty Exam: Physical Exam  Nursing note and vitals reviewed. Constitutional: She is oriented to person, place, and time. She appears well-developed.  Cardiovascular: Normal rate.  Neurological: She is alert and oriented to person, place, and time.  Psychiatric: She has a normal mood and affect. Her behavior is normal.    Review of Systems  Psychiatric/Behavioral: Negative  for depression, hallucinations and suicidal ideas. The patient is not nervous/anxious.   All other systems reviewed and are negative.   Blood pressure 117/63, pulse 95, temperature 97.9 F (36.6 C), resp. rate 18, height 5\' 3"  (1.6 m), weight 73.9 kg (163 lb), SpO2 100 %.Body mass index is 28.87 kg/m.  General Appearance: Casual and Fairly Groomed  Eye Contact:  Good  Speech:  Clear and Coherent and Normal Rate  Volume:  Normal  Mood:  Euthymic  Affect:  Appropriate, Congruent and Flat  Thought Process:  Coherent, Goal Directed and Descriptions of Associations: Loose  Orientation:  Other:  oriented to self and place  Thought Content:  Delusions  Suicidal Thoughts:  No  Homicidal Thoughts:  No  Memory:  Immediate;   Poor Recent;   Poor Remote;   Poor  Judgement:  Fair  Insight:  Fair  Psychomotor Activity:  Normal  Concentration:  Concentration: Fair  Recall:  [employer] of Knowledge:  Fair  Language:  Fair  Akathisia:  No  Handed:    AIMS (if indicated):     Assets:  Armed forces logistics/support/administrative officer Physical Health Resilience Social Support  ADL's:  Intact  Cognition:  WNL  Sleep:  Number of Hours: 6.75     Treatment Plan Summary: Daily contact with patient to assess and evaluate symptoms and progress in treatment and Medication management.   - Continue with current with treatment plan  on 06/16/2017 except where noted    - Schizophrenia - Continuehaldol 5mg  TID - Continue clozapine 50 mg BID held triration due to reported side effects of dizziness.  (plan to titrate up by 50mg /day until total daily dose of 300mg  on 06/17/17) - Continue haldol decanoate 150mg  IM q30 Days (last given11/30/18) - Continue depakote 500mg  BID - depakote level =  50 on 06/09/17   - Dementia with behavioral disturbance r/o Alzhemier's versus cognitive disorder unspecified - SW teamresearching placement options  -encourage participation in groups and therapeutic milieu -Discharge planning will be ongoing   Derrill Center, NP 06/16/2017, 1:40 PM   Agree with NP Progress Note

## 2017-06-16 NOTE — Progress Notes (Signed)
Nursing Note 06/16/2017 [id number]  Data Did not complete self-inventory today. Minimal, in room all morning, out to meals for lunch and dinner. Oriented to year and self, disoriented to place.  Patient reports feeling "fine" today, denies complaints.  Denies SI HI and AVH.  Patient much more coherent than when RN last saw patient 2 weeks ago.  Blunted, minimal.  Talking to RN about "I want to go to assisted living, I don't want to go to a group home."  RN encouraged patient to discuss with SW tomorrow.  Action Spoke with patient 1:1, nurse offered support to patient throughout shift.  Continues to be monitored on 15 minute checks for safety.  Response Remains safe on unit, minimal and somewhat isolative.

## 2017-06-17 NOTE — Plan of Care (Signed)
Pt attended wrap-up group this evening.

## 2017-06-17 NOTE — Progress Notes (Signed)
Patient has been ioslative to her room other than group and snack time. She requested medication to help her rest which she received. Writer praised her for how well she is doing now and she smiled and replied thank you. Support given and safety maintained on unit with 15 min checks.

## 2017-06-17 NOTE — Progress Notes (Signed)
Recreation Therapy Notes  Date: 06/17/17 Time: 1000 Location: [address] Dayroom  Group Topic: Coping Skills  Goal Area(s) Addresses:  Patient will be able to identify positive coping skills. Patient will be able to identify benefits of using coping skills. Patient will be able to identify benefits of using coping skills post d/c.  Intervention: Mind map, pencils, white board, marker, eraser  Activity: Mind map.  Patients and LRT filled in the first eight boxes of the mind map together with depression, anxiety, hygiene, wellness, family, withdrawal, work and social interaction.  Patients were to then identify three coping skills for each of the situations identified.  The group would then reconvene and coping skills would be written on the board.   Education: Radiographer, therapeutic, Dentist.   Education Outcome: Acknowledges understanding/In group clarification offered/Needs additional education.   Clinical Observations/Feedback: Pt did not attend group.    Victorino Sparrow, LRT/CTRS         Victorino Sparrow A 06/17/2017 12:33 PM

## 2017-06-17 NOTE — Progress Notes (Signed)
Pt in bed in room.  Pt initially irritable with Probation officer, refusing medications.  After staff provided support to patient, he affect brightened and she agreed to take medications.  Pt then became more visible in the milieu with appropriate interactions with staff and peers.  Needs assessed.  Pt denied.  Fifteen minute checks initiated for patient safety.  Pt safe on unit.

## 2017-06-17 NOTE — Progress Notes (Signed)
[name] Ball  06/17/2017 4:45 PM [name] Ball  MRN:  [id number] Subjective:   [name] Ball is a 51 y/o F with history of schizophrenia vs bipolar with psychotic features who was admitted initially to ED with worsening symptoms of psychosis including inability to take care of herself,hallucinations,and wandering from her group home.She was started on home medications but had ongoing agitation and eventually she was changed to clozapine (in addition to previous home medication of haldol). Pt has been showing gradual improvement with titration of clozapine demonstrated by decreased episodes of agitation. She had some drowsiness over the weekend and her titration of clozapine was decreased yesterday, but she notes resolution of her fatigue.  Today upon evaluation, pt reports she is doing well overall. She has no specific complaints other than concern that she will be placed at a group home. Discussed with patient that she came to [facility] from a group home, so she will likely need to return to her previous residence as we have been examining placement at other groups homes specialized for memory care and mental health care, and pt has demonstrated significant improvement in the recent days that she may be able to return to her previous group home. Pt shares she will discuss her options with some of the staff from her group home when they come to visit her.  Pt denies SI/HI/AH/VH today. She is oriented to year "2018" and location as "[place]." She agrees to continue her current treatment regimen without changes at this time. We will plan to have her group home staff come to evaluate her progress as soon as possible. Pt had no further questions, comments, or concerns.     Principal Problem: Schizophrenia ([place]) Diagnosis:   Patient Active Problem List   Diagnosis Date Noted  . Dementia with behavioral disturbance [F03.91] 06/04/2017  . Schizophrenia ([place]) [F20.9] 05/29/2017  . Bipolar  disorder, current episode manic, severe with psychotic features ([place]) [F31.2] 04/03/2017  . Hepatitis C virus infection without hepatic coma [B19.20]   . Bipolar disorder ([place]) [F31.9]   . GERD (gastroesophageal reflux disease) [K21.9]    Total Time spent with patient: 30 minutes  Past Psychiatric History: see H&P  Past Medical History:  Past Medical History:  Diagnosis Date  . Bipolar disorder ([place])   . GERD (gastroesophageal reflux disease)   . Hepatitis C virus infection without hepatic coma     Past Surgical History:  Procedure Laterality Date  . abdominal cyst removed    . CESAREAN SECTION    . COLON SURGERY     Family History: History reviewed. No pertinent family history. Family Psychiatric  History: see H&P Social History:  Social History   Substance and Sexual Activity  Alcohol Use No     Social History   Substance and Sexual Activity  Drug Use No    Social History   Socioeconomic History  . Marital status: Unknown    Spouse name: None  . Number of children: None  . Years of education: None  . Highest education level: None  Social Needs  . Financial resource strain: None  . Food insecurity - worry: None  . Food insecurity - inability: None  . Transportation needs - medical: None  . Transportation needs - non-medical: None  Occupational History  . None  Tobacco Use  . Smoking status: Current Every Day Smoker    Packs/day: 1.00    Types: Cigarettes  . Smokeless tobacco: Never Used  Substance and Sexual Activity  .  Alcohol use: No  . Drug use: No  . Sexual activity: None  Other Topics Concern  . None  Social History Narrative  . None   Additional Social History:                         Sleep: Good  Appetite:  Good  Current Medications: Current Facility-Administered Medications  Medication Dose Route Frequency Provider Last Rate Last Dose  . acetaminophen (TYLENOL) tablet 650 mg  650 mg Oral Q6H PRN Elmarie Shiley A, NP   650 mg  at 06/17/17 1531  . alum & mag hydroxide-simeth (MAALOX/MYLANTA) 200-200-20 MG/5ML suspension 30 mL  30 mL Oral Q4H PRN Elmarie Shiley A, NP      . chlorproMAZINE (THORAZINE) injection 100 mg  100 mg Intramuscular TID PRN Izediuno, Laruth Bouchard, MD      . chlorproMAZINE (THORAZINE) tablet 100 mg  100 mg Oral TID PRN Artist Beach, MD   100 mg at 06/11/17 2101  . cloZAPine (CLOZARIL) tablet 100 mg  100 mg Oral Tana Conch T, MD   100 mg at 06/17/17 1013   And  . cloZAPine (CLOZARIL) tablet 200 mg  200 mg Oral QHS Pennelope Bracken, MD      . diltiazem (CARDIZEM CD) 24 hr capsule 120 mg  120 mg Oral Daily Elmarie Shiley A, NP   120 mg at 06/17/17 1012  . divalproex (DEPAKOTE) DR tablet 500 mg  500 mg Oral BID Elmarie Shiley A, NP   500 mg at 06/17/17 1013  . haloperidol (HALDOL) tablet 5 mg  5 mg Oral TID Pennelope Bracken, MD   5 mg at 06/17/17 1421  . haloperidol decanoate (HALDOL DECANOATE) 100 MG/ML injection 150 mg  150 mg Intramuscular Q30 days Pennelope Bracken, MD   150 mg at 05/31/17 1213  . LORazepam (ATIVAN) tablet 1 mg  1 mg Oral Q6H PRN Niel Hummer, NP   1 mg at 06/16/17 2140  . magnesium hydroxide (MILK OF MAGNESIA) suspension 30 mL  30 mL Oral Daily PRN Elmarie Shiley A, NP      . nicotine (NICODERM CQ - dosed in mg/24 hours) patch 14 mg  14 mg Transdermal Daily Derrill Center, NP   14 mg at 06/16/17 0948  . ondansetron (ZOFRAN) tablet 4 mg  4 mg Oral Q8H PRN Niel Hummer, NP        Lab Results:  Results for orders placed or performed during the hospital encounter of 05/29/17 (from the past 48 hour(s))  Valproic acid level     Status: None   Collection Time: 06/16/17  6:50 AM  Result Value Ref Range   Valproic Acid Lvl 58 50.0 - 100.0 ug/mL    Comment: Performed at [facility], [place] [address], [place], [place] [address]    Blood Alcohol level:  Lab Results  Component Value Date   [facility] <5 05/23/2017   ETH <5  81/07/7508    Metabolic Disorder Labs: Lab Results  Component Value Date   HGBA1C 4.6 (L) 04/05/2017   MPG 85.32 04/05/2017   Lab Results  Component Value Date   PROLACTIN 50.6 (H) 04/05/2017   Lab Results  Component Value Date   CHOL 130 04/05/2017   TRIG 51 04/05/2017   HDL 57 04/05/2017   CHOLHDL 2.3 04/05/2017   VLDL 10 04/05/2017   LDLCALC 63 04/05/2017   LDLCALC 89 05/03/2012    Physical  Findings: AIMS: Facial and Oral Movements Muscles of Facial Expression: None, normal Lips and Perioral Area: None, normal Jaw: None, normal Tongue: None, normal,Extremity Movements Upper (arms, wrists, hands, fingers): None, normal Lower (legs, knees, ankles, toes): None, normal, Trunk Movements Neck, shoulders, hips: None, normal, Overall Severity Severity of abnormal movements (highest score from questions above): None, normal Incapacitation due to abnormal movements: None, normal Patient's awareness of abnormal movements (rate only patient's report): No Awareness, Dental Status Current problems with teeth and/or dentures?: No Does patient usually wear dentures?: No  CIWA:  CIWA-Ar Total: 1 COWS:  COWS Total Score: 3  Musculoskeletal: Strength & Muscle Tone: within normal limits Gait & Station: normal Patient leans: N/A  Psychiatric Specialty Exam: Physical Exam  Nursing Ball and vitals reviewed.   Review of Systems  Constitutional: Negative for chills and fever.  Respiratory: Negative for cough.   Cardiovascular: Negative for chest pain and palpitations.  Gastrointestinal: Negative for abdominal pain, heartburn, nausea and vomiting.  Psychiatric/Behavioral: Negative for depression, hallucinations and suicidal ideas. The patient is not nervous/anxious.     Blood pressure 131/68, pulse 90, temperature 98.5 F (36.9 C), temperature source Oral, resp. rate 18, height 5\' 3"  (1.6 m), weight 73.9 kg (163 lb), SpO2 100 %.Body mass index is 28.87 kg/m.  General Appearance:  Casual and Fairly Groomed  Eye Contact:  Good  Speech:  Garbled and Normal Rate  Volume:  Normal  Mood:  Anxious  Affect:  Appropriate and Flat  Thought Process:  Coherent, Disorganized and Descriptions of Associations: Loose  Orientation:  Other:  oriented to self, year, and city only  Thought Content:  Logical  Suicidal Thoughts:  No  Homicidal Thoughts:  No  Memory:  Immediate;   Poor Recent;   Poor Remote;   Poor  Judgement:  Impaired  Insight:  Lacking  Psychomotor Activity:  Normal  Concentration:  Concentration: Fair  Recall:  [employer] of Knowledge:  Fair  Language:  Fair  Akathisia:  No  Handed:    AIMS (if indicated):     Assets:  Armed forces logistics/support/administrative officer Physical Health Resilience Social Support  ADL's:  Intact  Cognition:  WNL  Sleep:  Number of Hours: 6.75     Treatment Plan Summary: Daily contact with patient to assess and evaluate symptoms and progress in treatment and Medication management. Pt has shown improvement of her agitation episodes and she is tolerating titration of clozapine without difficulty or side effects. She remains somewhat disorganized, delusional , and disoriented. She agrees to speak with staff from her group home about possible discharge back to her previous home. She agrees to continue her current medications without changes today.   - Continueinpatient hospitalization  - Schizophrenia - Continuehaldol 5mg  TID - Continue clozapine 100mg  qAM + 200mg  qhs - Continue haldol decanoate 150mg  IM q30 Days (last given11/30/18) - Continue depakote 500mg  BID - depakote level =50 on 06/09/17   - Dementia with behavioral disturbance r/o Alzhemier's versus cognitive disorder unspecified - SW teamresearching placement options  -encourage participation in groups and therapeutic milieu -Discharge planning will be ongoing    Pennelope Bracken,  MD 06/17/2017, 4:45 PM

## 2017-06-17 NOTE — [facility] Group Notes (Signed)

## 2017-06-17 NOTE — Tx Team (Signed)
Interdisciplinary Treatment and Diagnostic Plan Update  06/17/2017 Time of Session: 8:28 AM  [name] Ball MRN: [id number]  Principal Diagnosis: Schizophrenia [facility])  Secondary Diagnoses: Principal Problem:   Schizophrenia ([place]) Active Problems:   Dementia with behavioral disturbance   Current Medications:  Current Facility-Administered Medications  Medication Dose Route Frequency Provider Last Rate Last Dose  . acetaminophen (TYLENOL) tablet 650 mg  650 mg Oral Q6H PRN Niel Hummer, NP   650 mg at 06/12/17 1907  . alum & mag hydroxide-simeth (MAALOX/MYLANTA) 200-200-20 MG/5ML suspension 30 mL  30 mL Oral Q4H PRN Elmarie Shiley A, NP      . chlorproMAZINE (THORAZINE) injection 100 mg  100 mg Intramuscular TID PRN Izediuno, Laruth Bouchard, MD      . chlorproMAZINE (THORAZINE) tablet 100 mg  100 mg Oral TID PRN Artist Beach, MD   100 mg at 06/11/17 2101  . cloZAPine (CLOZARIL) tablet 100 mg  100 mg Oral BH-q7a Pennelope Bracken, MD       And  . cloZAPine (CLOZARIL) tablet 200 mg  200 mg Oral QHS Pennelope Bracken, MD      . diltiazem (CARDIZEM CD) 24 hr capsule 120 mg  120 mg Oral Daily Elmarie Shiley A, NP   120 mg at 06/16/17 0949  . divalproex (DEPAKOTE) DR tablet 500 mg  500 mg Oral BID Elmarie Shiley A, NP   500 mg at 06/16/17 1712  . haloperidol (HALDOL) tablet 5 mg  5 mg Oral TID Pennelope Bracken, MD   5 mg at 06/16/17 1712  . haloperidol decanoate (HALDOL DECANOATE) 100 MG/ML injection 150 mg  150 mg Intramuscular Q30 days Pennelope Bracken, MD   150 mg at 05/31/17 1213  . LORazepam (ATIVAN) tablet 1 mg  1 mg Oral Q6H PRN Niel Hummer, NP   1 mg at 06/16/17 2140  . magnesium hydroxide (MILK OF MAGNESIA) suspension 30 mL  30 mL Oral Daily PRN Elmarie Shiley A, NP      . nicotine (NICODERM CQ - dosed in mg/24 hours) patch 14 mg  14 mg Transdermal Daily Derrill Center, NP   14 mg at 06/16/17 0948  . ondansetron (ZOFRAN) tablet 4 mg  4 mg Oral Q8H PRN  Niel Hummer, NP        PTA Medications: Medications Prior to Admission  Medication Sig Dispense Refill Last Dose  . ARIPiprazole (ABILIFY) 20 MG tablet Take 1 tablet (20 mg total) by mouth daily. For mood control (Patient not taking: Reported on 05/23/2017) 30 tablet 0 Not Taking at Unknown time  . benztropine (COGENTIN) 0.5 MG tablet Take 1 tablet (0.5 mg total) by mouth 2 (two) times daily. For prevention of drug induced tremors 60 tablet 0 05/23/2017 at Unknown time  . clonazePAM (KLONOPIN) 1 MG tablet Take 0.5-1 mg by mouth 3 (three) times daily. Take one-half tablet by mouth twice daily and take one tablet at bedtime   05/23/2017 at Unknown time  . diltiazem (DILACOR XR) 120 MG 24 hr capsule Take 1 capsule (120 mg total) by mouth daily. For high blood pressure 10 capsule 0 05/23/2017 at Unknown time  . divalproex (DEPAKOTE) 500 MG DR tablet Take 2 tablets (1,000 mg total) by mouth at bedtime. For mood stabilization 60 tablet 0 05/22/2017 at 2000  . haloperidol (HALDOL) 5 MG tablet Take 1 tablet (5 mg total) by mouth 2 (two) times daily. For mood control (Patient taking differently: Take 5 mg by mouth 3 (  three) times daily. For mood control) 30 tablet 0 05/23/2017 at 800a  . haloperidol decanoate (HALDOL DECANOATE) 100 MG/ML injection Inject 1.5 mLs (150 mg total) into the muscle every 30 (thirty) days. (Due on 05-06-17): For mood control 1.5 mL 0 05/07/2017 at Unknown time  . ibuprofen (ADVIL,MOTRIN) 200 MG tablet Take 1-2 tablets (200-400 mg total) by mouth every 4 (four) hours as needed for moderate pain. 1 tablet 0 unknown  . LORazepam (ATIVAN) 1 MG tablet Take 1 mg by mouth every 6 (six) hours as needed for anxiety.   unknown  . nicotine (NICODERM CQ - DOSED IN MG/24 HR) 7 mg/24hr patch Place 1 patch (7 mg total) onto the skin daily. (May purchase from over the counter): For smoking cessation 28 patch 0 unknown  . pantoprazole (PROTONIX) 20 MG tablet Take 1 tablet (20 mg total) by mouth  daily. For acid reflux   05/23/2017 at Unknown time  . predniSONE (DELTASONE) 10 MG tablet Take 2 tablets (20 mg total) by mouth daily. 10 tablet 0   . QUEtiapine (SEROQUEL) 25 MG tablet Take 25 mg by mouth 3 (three) times daily.   05/23/2017 at 800a  . zolpidem (AMBIEN) 5 MG tablet Take 1 tablet (5 mg total) by mouth at bedtime as needed for sleep. 7 tablet 0 unknown    Patient Stressors: Financial difficulties Medication change or noncompliance Other: ongoing psychosis  Patient Strengths: General fund of knowledge Motivation for treatment/growth Physical Health  Treatment Modalities: Medication Management, Group therapy, Case management,  1 to 1 session with clinician, Psychoeducation, Recreational therapy.   Physician Treatment Plan for Primary Diagnosis: Schizophrenia ([place]) Long Term Goal(s): Improvement in symptoms so as ready for discharge  Short Term Goals: Ability to maintain clinical measurements within normal limits will improve Ability to demonstrate self-control will improve Ability to identify and develop effective coping behaviors will improve  Medication Management: Evaluate patient's response, side effects, and tolerance of medication regimen.  Therapeutic Interventions: 1 to 1 sessions, Unit Group sessions and Medication administration.  Evaluation of Outcomes: Progressing   12/4: Pt continues to have delusions and episodes of agitation. She attempts to elope, but she is redirectable. She is only oriented to self and year, and she will require placement at facility with ability to care for those with cognitive disorders/dementia.  - Schizophrenia - Continuehaldol 69m TID                         - Start haldol 562mpo q8h prn agitation (or geodon 2072mM q12h prn agitation if pt refuses oral medications) - Continue haldol decanoate 150m61m q30 Days (last given11/30/18) - Continue abilify 20mg48my - continue  cogentin 0.5mg B37m- Continue sinequan 25mg q58mrn insomnia  - Continue depakote 500mg BI19m2/10: Patient has a long history of schizophrenia and SUD. She has been resistant to multiple agents over the years. She exhibits cognitive deficits which is a feature of chronic schizophrenia. CT head is equivocal as there is some gray matter loss in the frontal and temporal lobes. Very difficult to establish concurrent dementia. 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 Available options at this time is trial of Clozapine and probably ECT if she fails adequate trial.   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 Clozapine workup 2. Consent from legal guardian when we have results of work up 3. Emergency use of Chlorpromazine  until we initiate Clozapine 4. Continue close observation as patient is potentially dangerous.  12/13: Pt has some improvement in episodes of agitation and disorganized behaviors since being started on clozapine, but she remains disorganized and delusional overall. We will continue to titrate dose of clozapine.  - Continueinpatient hospitalization  - Schizophrenia - Continuehaldol 18m TID - Change clozapine 12.538mpo BID to clozapine 2557mID - Continue haldol decanoate 150m19m q30 Days (last given11/30/18) - Continue depakote 500mg38m - depakote level 50 on 06/09/17  - Dementia with behavioral disturbance r/o Alzhemier's versus cognitive disorder unspecified -ContinueAricept 5mg P21mHS - SW teamresearching placement options  12/17: Mr HumphrRaynelle Dicktake patient back if ACT team and guardian agree that she can return based on her improvement in behavior and thinking.  CSW to contact both today to arrange for observation, discussion    Physician Treatment  Plan for Secondary Diagnosis: Principal Problem:   Schizophrenia (HCC) [place] Problems:   Dementia with behavioral disturbance   Long Term Goal(s): Improvement in symptoms so as ready for discharge  Short Term Goals: Ability to maintain clinical measurements within normal limits will improve Ability to demonstrate self-control will improve Ability to identify and develop effective coping behaviors will improve  Medication Management: Evaluate patient's response, side effects, and tolerance of medication regimen.  Therapeutic Interventions: 1 to 1 sessions, Unit Group sessions and Medication administration.  Evaluation of Outcomes: Progressing   RN Treatment Plan for Primary Diagnosis: Schizophrenia (HCC) [place] Term Goal(s): Knowledge of disease and therapeutic regimen to maintain health will improve  Short Term Goals: Ability to identify and develop effective coping behaviors will improve and Compliance with prescribed medications will improve  Medication Management: RN will administer medications as ordered by provider, will assess and evaluate patient's response and provide education to patient for prescribed medication. RN will report any adverse and/or side effects to prescribing provider.  Therapeutic Interventions: 1 on 1 counseling sessions, Psychoeducation, Medication administration, Evaluate responses to treatment, Monitor vital signs and CBGs as ordered, Perform/monitor CIWA, COWS, AIMS and Fall Risk screenings as ordered, Perform wound care treatments as ordered.  Evaluation of Outcomes: Progressing   LCSW Treatment Plan for Primary Diagnosis: Schizophrenia (HCC) [place] Goal(s): Safe transition to appropriate next level of care at discharge, Engage patient in therapeutic group addressing interpersonal concerns.  Short Term Goals: Engage patient in aftercare planning with referrals and resources  Therapeutic Interventions: Assess for all discharge needs, 1 to 1 time  with Social worker, Explore available resources and support systems, Assess for adequacy in community support network, Educate family and significant other(s) on suicide prevention, Complete Psychosocial Assessment, Interpersonal group therapy.  Evaluation of Outcomes: Not Met  Unsure of where pt will go at d/c.  CSW to talk to Mr HumphrRaynelle Dick taking pt back 12/10:  Had been checking into locked memory care units based on dx of dementia, but current dr note suggests different course of treatment.  Dr R to make decision tomorrow based on notes of Dr IZ over weekend 12/13:  Have made 4 referrals to facilities that work with both mental; health and dementia-all denied-one because it was a SNF, 3 because of her behaviors.  Will continue to send information to potential facilities  Progress in Treatment: Attending groups: Yes Participating in groups: Yes Taking medication as prescribed: Yes Toleration medication: Yes, no side effects reported at this time Family/Significant other contact made: Yes  Guardian Patient understands diagnosis: No  Limited insight Discussing patient identified problems/goals  with staff: Yes Medical problems stabilized or resolved: Yes Denies suicidal/homicidal ideation: Yes Issues/concerns per patient self-inventory: None Other: N/A  New problem(s) identified: None identified at this time.   New Short Term/Long Term Goal(s): None identified at this time.   Discharge Plan or Barriers:   Reason for Continuation of Hospitalization: Disorganization Delusions  Medication stabilization   Estimated Length of Stay: 12/21  Attendees: Patient: 06/17/2017  8:28 AM  Physician: Maris Berger, MD 06/17/2017  8:28 AM  Nursing: Sena Hitch, RN 06/17/2017  8:28 AM  RN Care Manager: Lars Pinks, RN 06/17/2017  8:28 AM  Social Worker: Ripley Fraise 06/17/2017  8:28 AM  Recreational Therapist: Winfield Cunas 06/17/2017  8:28 AM  Other: Norberto Sorenson  06/17/2017  8:28 AM  Other:  06/17/2017  8:28 AM    Scribe for Treatment Team:  Roque Lias LCSW 06/17/2017 8:28 AM

## 2017-06-17 NOTE — Progress Notes (Signed)
Adult Psychoeducational Group Note  Date:  06/17/2017 Time:  9:23 PM  Group Topic/Focus:  Wrap-Up Group:   The focus of this group is to help patients review their daily goal of treatment and discuss progress on daily workbooks.  Participation Level:  Minimal  Participation Quality:  Appropriate  Affect:  Appropriate   Cognitive:  Lacking  Insight: Limited  Engagement in Group:  Engaged  Modes of Intervention:  Socialization and Support  Additional Comments:  Patient attended and participated in group tonight. She reports that her morning was slow. She was given some medication and that made her feel better.  Salley Scarlet [facility] 06/17/2017, 9:23 PM

## 2017-06-17 NOTE — Progress Notes (Signed)
DATA ACTION RESPONSE  Objective- Pt is visible in the dayroom, seen eating a snack. Isolative to room for majority of shift. Presents with a flat/anxious and mood.Brightens mildly on approach.Cooperative this evening. No abnormal s/s.  Subjective- Denies having any SI/HI/AVH/Pain at this time. Is cooperative and remains safe on the unit.  1:1 interaction in private to establish rapport. Encouragement, education, & support given from staff.  PRN Ativan requested and will re-eval accordingly.   Safety maintained with Q 15 checks. Continue with POC.

## 2017-06-17 NOTE — Progress Notes (Signed)
Adult Psychoeducational Group Note  Date:  06/17/2017 Time:  2:37 AM  Group Topic/Focus:  Wrap-Up Group:   The focus of this group is to help patients review their daily goal of treatment and discuss progress on daily workbooks.  Participation Level:  Minimal  Participation Quality:  Appropriate  Affect:  Appropriate  Cognitive:  Oriented  Insight: Appropriate  Engagement in Group:  Engaged  Modes of Intervention:  Socialization and Support  Additional Comments:  Patient attended and participated in group tonight. She reports having a good day. She stated that she put too much pressure on herself. She has to take it easy. Today she spoke with her family. She believed it was time to reach out to them.  Salley Scarlet [facility] 06/17/2017, 2:37 AM

## 2017-06-18 LAB — CLOZAPINE (CLOZARIL)
Clozapine Lvl: 186 ng/mL — ABNORMAL LOW (ref 350–650)
NORCLOZAPINE: 42 ng/mL
TOTAL(CLOZ+ NORCLOZ): 228 ng/mL

## 2017-06-18 NOTE — Progress Notes (Signed)
Data. Patient denies SI/HI. Visibly responding to internal stimulation. Patient mumbling, laughing and dancing/swaying inappropriately.  Verbally contracts for safety on the unit and to come to staff before acting of any self harm thoughts/feelings/voices.  Patient interacting minimally with peer and staff. Action. Emotional support and encouragement offered. Education provided on medication, indications and side effect. Q 15 minute checks done for safety. Response. Safety on the unit maintained through 15 minute checks.  Medications taken as prescribed. Attended groups. Remained calm through out shift.

## 2017-06-18 NOTE — Progress Notes (Signed)
Patient ID: [name] Ball, female   DOB: [birth year], 51 y.o.   MRN: [id number]   D: Patient pleasant and cooperative with care this shift but is noted to be paranoid and guarded in the milieu. A: Encourage staff/peer interaction, medication compliance, and group participation. Administer medications as ordered, maintain Q 15 minute safety checks. R: Pt compliant with medications but did not attended group session. Pt denies SI at this time and verbally contracts for safety. No signs/symptoms of distress noted.

## 2017-06-18 NOTE — Progress Notes (Signed)
Recreation Therapy Notes  Date: 06/18/17 Time: 0950 Location:  500 Hall Dayroom   Group Topic: Self-Esteem  Goal Area(s) Addresses:  Patient will successfully identify positive attributes about themselves.  Patient will successfully identify benefit of improved self-esteem.   Intervention: Magazines, scissors, glue sticks, Architect paper  Activity: Collage.  Patients were to create a collage that highlighted things about them such as what they like, new things they would like to try, things that mean something to them, etc.  Education:  Self-Esteem, Discharge Planning.   Education Outcome: Acknowledges education/In group clarification offered/Needs additional education  Clinical Observations/Feedback: Pt did not attend group.    Victorino Sparrow, LRT/CTRS         Victorino Sparrow A 06/18/2017 12:08 PM

## 2017-06-18 NOTE — Progress Notes (Signed)
[facility] MD Progress Note  06/18/2017 12:42 PM [name] Ball  MRN:  [id number] Subjective:   [name] Ball is a 51 y/o F with history of schizophrenia vs bipolar with psychotic features who was admitted initially to ED with worsening symptoms of psychosis including inability to take care of herself,hallucinations,and wandering from her group home.Shewas started on home medications but had ongoing agitation and she was started on clozapine (in addition to previous home medication ofhaldol). Pt has been demonstrating improvement of agitation with titration of clozapine. She remains delusional, disorganized, and disoriented. Today she was visited by staff from her ACT team to evaluate her progress since admission.   Today upon evaluation, pt reports she is feeling slightly drowsy but otherwise she has no complaints. She denies SI/HI/AH/VH but she remains delusional and mildly paranoid. She has ongoing delusions about her multiple children which pt indicates are as young as toddlers, and she believes they are in the building with her. She is not oriented to year or location as she was on previous days, as she is unable to name the year and she believes her current location is "[place]." Discussed with patient about potential plan to return to her group home, and she replied, "I don't want to go back there - there's snakes in the floor." Discussed with patient that her ACT team and group home staff will assure her safety, and pt was in agreement to meet with ACT team staff today. If ACT team and group home are in agreement, we will anticipate discharge back to group home when available in the coming days.     Principal Problem: Schizophrenia ([place]) Diagnosis:   Patient Active Problem List   Diagnosis Date Noted  . Dementia with behavioral disturbance [F03.91] 06/04/2017  . Schizophrenia ([place]) [F20.9] 05/29/2017  . Bipolar disorder, current episode manic, severe with psychotic features ([place]) [F31.2]  04/03/2017  . Hepatitis C virus infection without hepatic coma [B19.20]   . Bipolar disorder ([place]) [F31.9]   . GERD (gastroesophageal reflux disease) [K21.9]    Total Time spent with patient: 30 minutes  Past Psychiatric History: see H&P  Past Medical History:  Past Medical History:  Diagnosis Date  . Bipolar disorder ([place])   . GERD (gastroesophageal reflux disease)   . Hepatitis C virus infection without hepatic coma     Past Surgical History:  Procedure Laterality Date  . abdominal cyst removed    . CESAREAN SECTION    . COLON SURGERY     Family History: History reviewed. No pertinent family history. Family Psychiatric  History: see H&P Social History:  Social History   Substance and Sexual Activity  Alcohol Use No     Social History   Substance and Sexual Activity  Drug Use No    Social History   Socioeconomic History  . Marital status: Unknown    Spouse name: None  . Number of children: None  . Years of education: None  . Highest education level: None  Social Needs  . Financial resource strain: None  . Food insecurity - worry: None  . Food insecurity - inability: None  . Transportation needs - medical: None  . Transportation needs - non-medical: None  Occupational History  . None  Tobacco Use  . Smoking status: Current Every Day Smoker    Packs/day: 1.00    Types: Cigarettes  . Smokeless tobacco: Never Used  Substance and Sexual Activity  . Alcohol use: No  . Drug use: No  . Sexual activity: None  Other Topics Concern  . None  Social History Narrative  . None   Additional Social History:                         Sleep: Good  Appetite:  Good  Current Medications: Current Facility-Administered Medications  Medication Dose Route Frequency Provider Last Rate Last Dose  . acetaminophen (TYLENOL) tablet 650 mg  650 mg Oral Q6H PRN Elmarie Shiley A, NP   650 mg at 06/17/17 1531  . alum & mag hydroxide-simeth (MAALOX/MYLANTA) 200-200-20  MG/5ML suspension 30 mL  30 mL Oral Q4H PRN Elmarie Shiley A, NP      . chlorproMAZINE (THORAZINE) injection 100 mg  100 mg Intramuscular TID PRN Izediuno, Laruth Bouchard, MD      . chlorproMAZINE (THORAZINE) tablet 100 mg  100 mg Oral TID PRN Artist Beach, MD   100 mg at 06/11/17 2101  . cloZAPine (CLOZARIL) tablet 100 mg  100 mg Oral Tana Conch T, MD   100 mg at 06/18/17 8413   And  . cloZAPine (CLOZARIL) tablet 200 mg  200 mg Oral QHS Pennelope Bracken, MD   200 mg at 06/17/17 2104  . diltiazem (CARDIZEM CD) 24 hr capsule 120 mg  120 mg Oral Daily Elmarie Shiley A, NP   120 mg at 06/17/17 1012  . divalproex (DEPAKOTE) DR tablet 500 mg  500 mg Oral BID Elmarie Shiley A, NP   500 mg at 06/18/17 2440  . haloperidol (HALDOL) tablet 5 mg  5 mg Oral TID Pennelope Bracken, MD   5 mg at 06/18/17 1159  . haloperidol decanoate (HALDOL DECANOATE) 100 MG/ML injection 150 mg  150 mg Intramuscular Q30 days Pennelope Bracken, MD   150 mg at 05/31/17 1213  . LORazepam (ATIVAN) tablet 1 mg  1 mg Oral Q6H PRN Niel Hummer, NP   1 mg at 06/17/17 2104  . magnesium hydroxide (MILK OF MAGNESIA) suspension 30 mL  30 mL Oral Daily PRN Elmarie Shiley A, NP      . nicotine (NICODERM CQ - dosed in mg/24 hours) patch 14 mg  14 mg Transdermal Daily Derrill Center, NP   14 mg at 06/16/17 0948  . ondansetron (ZOFRAN) tablet 4 mg  4 mg Oral Q8H PRN Niel Hummer, NP        Lab Results: No results found for this or any previous visit (from the past 48 hour(s)).  Blood Alcohol level:  Lab Results  Component Value Date   [facility] <5 05/23/2017   ETH <5 04/28/2535    Metabolic Disorder Labs: Lab Results  Component Value Date   HGBA1C 4.6 (L) 04/05/2017   MPG 85.32 04/05/2017   Lab Results  Component Value Date   PROLACTIN 50.6 (H) 04/05/2017   Lab Results  Component Value Date   CHOL 130 04/05/2017   TRIG 51 04/05/2017   HDL 57 04/05/2017   CHOLHDL 2.3 04/05/2017   VLDL 10  04/05/2017   LDLCALC 63 04/05/2017   LDLCALC 89 05/03/2012    Physical Findings: AIMS: Facial and Oral Movements Muscles of Facial Expression: None, normal Lips and Perioral Area: None, normal Jaw: None, normal Tongue: None, normal,Extremity Movements Upper (arms, wrists, hands, fingers): None, normal Lower (legs, knees, ankles, toes): None, normal, Trunk Movements Neck, shoulders, hips: None, normal, Overall Severity Severity of abnormal movements (highest score from questions above): None, normal Incapacitation due to abnormal movements: None, normal Patient's  awareness of abnormal movements (rate only patient's report): No Awareness, Dental Status Current problems with teeth and/or dentures?: No Does patient usually wear dentures?: No  CIWA:  CIWA-Ar Total: 1 COWS:  COWS Total Score: 3  Musculoskeletal: Strength & Muscle Tone: within normal limits Gait & Station: normal Patient leans: N/A  Psychiatric Specialty Exam: Physical Exam  Nursing note and vitals reviewed.   Review of Systems  Constitutional: Negative for chills and fever.  Respiratory: Negative for cough and shortness of breath.   Cardiovascular: Negative for chest pain.  Psychiatric/Behavioral: Negative for depression and suicidal ideas. The patient is not nervous/anxious.     Blood pressure 131/81, pulse (!) 105, temperature 98.5 F (36.9 C), temperature source Oral, resp. rate 18, height 5\' 3"  (1.6 m), weight 73.9 kg (163 lb), SpO2 100 %.Body mass index is 28.87 kg/m.  General Appearance: Casual and Fairly Groomed  Eye Contact:  Good  Speech:  Clear and Coherent and Normal Rate  Volume:  Normal  Mood:  Euthymic  Affect:  Appropriate and Congruent  Thought Process:  Coherent and Goal Directed  Orientation:  Other:  not oriented to location or year  Thought Content:  Delusions and Rumination  Suicidal Thoughts:  No  Homicidal Thoughts:  No  Memory:  Immediate;   Poor Recent;   Poor Remote;   Poor   Judgement:  Impaired  Insight:  Lacking  Psychomotor Activity:  Normal  Concentration:  Concentration: Fair  Recall:  [employer] of Knowledge:  Fair  Language:  Fair  Akathisia:  No  Handed:    AIMS (if indicated):     Assets:  Armed forces logistics/support/administrative officer Physical Health Resilience Social Support  ADL's:  Intact  Cognition:  WNL  Sleep:  Number of Hours: 5.5     Treatment Plan Summary: Daily contact with patient to assess and evaluate symptoms and progress in treatment and Medication management. Pt has stability of agitation and she appears to be near baseline with her delusions. She remains disoriented and disorganized, but she has no concerns today. She will meet with ACT team staff regarding possible discharge back to her group home in the coming days.   - Continueinpatient hospitalization  - Schizophrenia - Continuehaldol 5mg  TID - Continue clozapine 100mg  qAM + 200mg  qhs - Continue haldol decanoate 150mg  IM q30 Days (last given11/30/18) - Continue depakote 500mg  BID - depakote level =50 on 06/09/17   - Dementia with behavioral disturbance r/o Alzhemier's versus cognitive disorder unspecified - see above treatment recommendations  -encourage participation in groups and therapeutic milieu -Discharge planning will be ongoing     Pennelope Bracken, MD 06/18/2017, 12:42 PM

## 2017-06-18 NOTE — Progress Notes (Signed)
Did not attend group 

## 2017-06-18 NOTE — [facility] Group Notes (Signed)

## 2017-06-19 DIAGNOSIS — F201 Disorganized schizophrenia: Secondary | ICD-10-CM

## 2017-06-19 LAB — CBC WITH DIFFERENTIAL/PLATELET
BASOS PCT: 0 %
Basophils Absolute: 0 10*3/uL (ref 0.0–0.1)
Eosinophils Absolute: 0.1 10*3/uL (ref 0.0–0.7)
Eosinophils Relative: 2 %
HEMATOCRIT: 39.4 % (ref 36.0–46.0)
HEMOGLOBIN: 12.7 g/dL (ref 12.0–15.0)
LYMPHS ABS: 1.5 10*3/uL (ref 0.7–4.0)
Lymphocytes Relative: 25 %
MCH: 29.7 pg (ref 26.0–34.0)
MCHC: 32.2 g/dL (ref 30.0–36.0)
MCV: 92.3 fL (ref 78.0–100.0)
MONOS PCT: 8 %
Monocytes Absolute: 0.5 10*3/uL (ref 0.1–1.0)
NEUTROS ABS: 4.1 10*3/uL (ref 1.7–7.7)
NEUTROS PCT: 65 %
Platelets: 262 10*3/uL (ref 150–400)
RBC: 4.27 MIL/uL (ref 3.87–5.11)
RDW: 13 % (ref 11.5–15.5)
WBC: 6.2 10*3/uL (ref 4.0–10.5)

## 2017-06-19 MED ORDER — LOPERAMIDE HCL 2 MG PO CAPS
4.0000 mg | ORAL_CAPSULE | Freq: Every evening | ORAL | Status: DC | PRN
  Administered 2017-06-19: 4 mg via ORAL
  Filled 2017-06-19: qty 2

## 2017-06-19 NOTE — Progress Notes (Signed)
Patient ID: [name] Ball, female   DOB: 01-Mar-1966, 51 y.o.   MRN: [id number]  Pt currently presents with a flat affect and baseline behavior. Pt chooses not to attend group, comes out of her room to get snack and ask for coffee. Pt reports ongoing bowel incontinence at night. Pt requests medication management for these symptoms. Pt reports "okay" sleep with current medication regimen.   Pt provided with medications per providers orders. Pt's labs and vitals were monitored throughout the night. Pt given a 1:1 about emotional and mental status. Pt supported and encouraged to express concerns and questions. Pt educated on medications. Provider notified of ongoing nighttime symptoms, see MAR.   Pt's safety ensured with 15 minute and environmental checks. Pt currently denies SI/HI and A/V hallucinations. Pt verbally agrees to seek staff if SI/HI or A/VH occurs and to consult with staff before acting on any harmful thoughts. Will continue POC.

## 2017-06-19 NOTE — Progress Notes (Addendum)
D: Patient observed resting in bed, sleeping on and off. Patient's sheets soaked, smells of feces though denies incontinence. Patient disheveled with strong body odor. Patient forwards minimal information, wakes only to take her meds however does awake easily. Patient's affect flat, mood preoccupied. Patient refusing to complete self inventory. Denies pain, physical complaints.   A: Medicated per orders, no prns requested or required. Level III obs in place for safety. Emotional support offered and self inventory reviewed. Encouraged patient to take a shower and change linens with offered assist. Also encouraged patient to participate in programming. Discussed POC with MD, SW.  Per NP, patient disclosed to her she is drooling and is stooling herself. NP will discuss with MD. Fall prevention plan in place and reviewed with patient as pt is a high fall risk due to hx of falls.   R: Patient verbalizes some understanding of POC, falls prevention education. Will continue to reinforce. Patient denies SI/HI/AVH and remains safe on level III obs. Will continue to monitor closely and make verbal contact frequently.

## 2017-06-19 NOTE — Plan of Care (Signed)
Patient has not had any aggressive episodes, no acting out.

## 2017-06-19 NOTE — Progress Notes (Signed)
[facility] MD Progress Note  06/19/2017 2:13 PM Mame Twombly  MRN:  [id number]  Subjective: Monserrate reports, "I'm not good. I don't know if the medicine or what is making me have accidents. My mood is okay. My bed is wet because I drool all the time"   Objective: Kisha Messman is a 51 y/o F with history of schizophrenia vs bipolar with psychotic features who was admitted initially to ED with worsening symptoms of psychosis including inability to take care of herself,hallucinations,and wandering from her group home.Shewas started on home medications but had ongoing agitation and she was started on clozapine (in addition to previous home medication ofhaldol). Pt has been demonstrating improvement of agitation with titration of clozapine. She remains delusional, disorganized, and disoriented. Today she was visited by staff from her ACT team to evaluate her progress since admission.  Today 06-19-17, upon evaluation, Larine is seen lying down in her bed, feeling slightly drowsy but arousable. She is verbally responsive. She states she did not know if her medications or what is making her have bowel & bladder accidents. She also states she is drooling quite much.  She denies SI/HI/AH/VH but she remains delusional and mildly paranoid. She has ongoing delusions about her multiple children which she indicates are as young as toddlers, and she believes they are in the building with her. She is not oriented to year or location as she was on previous days, as she is unable to name the year and she believes her current location is "[place]." Discussed with patient about potential plan to return to her group home, and she replied, "I don't want to go back there - there's snakes on the floor." Discussed with patient that her ACT team and group home staff will assure her safety, and pt was in agreement to meet with ACT team staff. If ACT team and group home are in agreement, we will anticipate discharge back to group  home when available in the coming days.   Principal Problem: Schizophrenia ([place])  Diagnosis:   Patient Active Problem List   Diagnosis Date Noted  . Dementia with behavioral disturbance [F03.91] 06/04/2017  . Schizophrenia ([place]) [F20.9] 05/29/2017  . Bipolar disorder, current episode manic, severe with psychotic features ([place]) [F31.2] 04/03/2017  . Hepatitis C virus infection without hepatic coma [B19.20]   . Bipolar disorder ([place]) [F31.9]   . GERD (gastroesophageal reflux disease) [K21.9]    Total Time spent with patient: 15 minutes  Past Psychiatric History: See H&P  Past Medical History:  Past Medical History:  Diagnosis Date  . Bipolar disorder ([place])   . GERD (gastroesophageal reflux disease)   . Hepatitis C virus infection without hepatic coma     Past Surgical History:  Procedure Laterality Date  . abdominal cyst removed    . CESAREAN SECTION    . COLON SURGERY     Family History: History reviewed. No pertinent family history.  Family Psychiatric  History: See H&P  Social History:  Social History   Substance and Sexual Activity  Alcohol Use No     Social History   Substance and Sexual Activity  Drug Use No    Social History   Socioeconomic History  . Marital status: Unknown    Spouse name: None  . Number of children: None  . Years of education: None  . Highest education level: None  Social Needs  . Financial resource strain: None  . Food insecurity - worry: None  . Food insecurity - inability: None  .  Transportation needs - medical: None  . Transportation needs - non-medical: None  Occupational History  . None  Tobacco Use  . Smoking status: Current Every Day Smoker    Packs/day: 1.00    Types: Cigarettes  . Smokeless tobacco: Never Used  Substance and Sexual Activity  . Alcohol use: No  . Drug use: No  . Sexual activity: None  Other Topics Concern  . None  Social History Narrative  . None   Additional Social History:   Sleep:  Good  Appetite:  Good  Current Medications: Current Facility-Administered Medications  Medication Dose Route Frequency Provider Last Rate Last Dose  . acetaminophen (TYLENOL) tablet 650 mg  650 mg Oral Q6H PRN Elmarie Shiley A, NP   650 mg at 06/17/17 1531  . alum & mag hydroxide-simeth (MAALOX/MYLANTA) 200-200-20 MG/5ML suspension 30 mL  30 mL Oral Q4H PRN Elmarie Shiley A, NP      . chlorproMAZINE (THORAZINE) injection 100 mg  100 mg Intramuscular TID PRN Izediuno, Laruth Bouchard, MD      . chlorproMAZINE (THORAZINE) tablet 100 mg  100 mg Oral TID PRN Artist Beach, MD   100 mg at 06/11/17 2101  . cloZAPine (CLOZARIL) tablet 100 mg  100 mg Oral Tana Conch T, MD   100 mg at 06/19/17 9323   And  . cloZAPine (CLOZARIL) tablet 200 mg  200 mg Oral QHS Pennelope Bracken, MD   200 mg at 06/18/17 2119  . diltiazem (CARDIZEM CD) 24 hr capsule 120 mg  120 mg Oral Daily Elmarie Shiley A, NP   120 mg at 06/19/17 1002  . divalproex (DEPAKOTE) DR tablet 500 mg  500 mg Oral BID Elmarie Shiley A, NP   500 mg at 06/19/17 5573  . haloperidol (HALDOL) tablet 5 mg  5 mg Oral TID Pennelope Bracken, MD   5 mg at 06/19/17 1151  . haloperidol decanoate (HALDOL DECANOATE) 100 MG/ML injection 150 mg  150 mg Intramuscular Q30 days Pennelope Bracken, MD   150 mg at 05/31/17 1213  . LORazepam (ATIVAN) tablet 1 mg  1 mg Oral Q6H PRN Niel Hummer, NP   1 mg at 06/18/17 2119  . magnesium hydroxide (MILK OF MAGNESIA) suspension 30 mL  30 mL Oral Daily PRN Elmarie Shiley A, NP      . nicotine (NICODERM CQ - dosed in mg/24 hours) patch 14 mg  14 mg Transdermal Daily Derrill Center, NP   14 mg at 06/16/17 0948  . ondansetron (ZOFRAN) tablet 4 mg  4 mg Oral Q8H PRN Niel Hummer, NP       Lab Results: No results found for this or any previous visit (from the past 48 hour(s)).  Blood Alcohol level:  Lab Results  Component Value Date   [facility] <5 05/23/2017   ETH <5 22/08/5425   Metabolic  Disorder Labs: Lab Results  Component Value Date   HGBA1C 4.6 (L) 04/05/2017   MPG 85.32 04/05/2017   Lab Results  Component Value Date   PROLACTIN 50.6 (H) 04/05/2017   Lab Results  Component Value Date   CHOL 130 04/05/2017   TRIG 51 04/05/2017   HDL 57 04/05/2017   CHOLHDL 2.3 04/05/2017   VLDL 10 04/05/2017   LDLCALC 63 04/05/2017   LDLCALC 89 05/03/2012   Physical Findings: AIMS: Facial and Oral Movements Muscles of Facial Expression: None, normal Lips and Perioral Area: None, normal Jaw: None, normal Tongue: None, normal,Extremity Movements Upper (  arms, wrists, hands, fingers): None, normal Lower (legs, knees, ankles, toes): None, normal, Trunk Movements Neck, shoulders, hips: None, normal, Overall Severity Severity of abnormal movements (highest score from questions above): None, normal Incapacitation due to abnormal movements: None, normal Patient's awareness of abnormal movements (rate only patient's report): No Awareness, Dental Status Current problems with teeth and/or dentures?: No Does patient usually wear dentures?: No  CIWA:  CIWA-Ar Total: 1 COWS:  COWS Total Score: 3  Musculoskeletal: Strength & Muscle Tone: within normal limits Gait & Station: normal Patient leans: N/A  Psychiatric Specialty Exam: Physical Exam  Nursing note and vitals reviewed.   Review of Systems  Constitutional: Negative for chills and fever.  Respiratory: Negative for cough and shortness of breath.   Cardiovascular: Negative for chest pain.  Psychiatric/Behavioral: Negative for depression and suicidal ideas. The patient is not nervous/anxious.     Blood pressure (!) 130/59, pulse 98, temperature 98.7 F (37.1 C), resp. rate 18, height 5\' 3"  (1.6 m), weight 73.9 kg (163 lb), SpO2 100 %.Body mass index is 28.87 kg/m.  General Appearance: Casual and Fairly Groomed  Eye Contact:  Good  Speech:  Clear and Coherent and Normal Rate  Volume:  Normal  Mood:  Euthymic  Affect:   Appropriate and Congruent  Thought Process:  Coherent and Goal Directed  Orientation:  Other:  not oriented to location or year  Thought Content:  Delusions and Rumination  Suicidal Thoughts:  No  Homicidal Thoughts:  No  Memory:  Immediate;   Poor Recent;   Poor Remote;   Poor  Judgement:  Impaired  Insight:  Lacking  Psychomotor Activity:  Normal  Concentration:  Concentration: Fair  Recall:  [employer] of Knowledge:  Fair  Language:  Fair  Akathisia:  No  Handed:    AIMS (if indicated):     Assets:  Armed forces logistics/support/administrative officer Physical Health Resilience Social Support  ADL's:  Intact  Cognition:  WNL  Sleep:  Number of Hours: 5.5   Treatment Plan Summary: Daily contact with patient to assess and evaluate symptoms and progress in treatment and Medication management. Pt has stability of agitation and she appears to be near baseline with her delusions. She remains disoriented and disorganized, but she has no concerns today. She will meet with ACT team staff regarding possible discharge back to her group home in the coming days.   - Continueinpatient hospitalization.  Will continue today 06/19/2017 plan as below except where it is noted.  - Schizophrenia - Continuehaldol 5mg  TID - Continue clozapine 100 mg qAM + 200 mg qhs - Continue haldol decanoate 150mg  IM q30 Days (last given11/30/18) - Continue depakote 500mg  BID - depakote level =50 on 06/09/17, therapeutic.              - Clozaril level drawn on 06-16-17, 186, subtherapeutic.      - Dementia with behavioral disturbance r/o Alzhemier's versus cognitive disorder unspecified - see above treatment recommendations  -encourage participation in groups and therapeutic milieu -Discharge planning will be ongoing  Lindell Spar, NP, PMHNP, FNP-BC. 06/19/2017, 2:13 PMPatient ID: [name], female   DOB: 23-Jul-1965, 51 y.o.   MRN:  [id number]

## 2017-06-19 NOTE — Progress Notes (Signed)
Soiled linens (with feces) found in patient's bathroom. Patient smells of feces and states, "I had a BM last night." Assisted with linen change, bathroom cleaned. Patient strongly encouraged to shower. Toiletries provided along with clean scrubs. Patient refuses to bathe and states, "I will later. Leave me alone." Will continue to encourage.

## 2017-06-19 NOTE — Progress Notes (Signed)
Recreation Therapy Notes  Date: 06/19/17 Time: 1000 Location: 500 Hall Dayroom  Group Topic: Leisure Education  Goal Area(s) Addresses:  Patient will identify positive leisure activities.  Patient will identify one positive benefit of participation in leisure activities.   Intervention: Various words, dry erase marker, white board, eraser  Activity: [place].  Patients were divided into two teams.  One person from a team would pick a word from the container and draw it on the board.  That team gets one minute to guess the picture drawn on the board.  If the team doesn't guess the picture, the other team can steal the point.  If they don't guess the picture, no one gets the point.  The other team would then go.  Education:  Leisure Education, Dentist  Education Outcome: Acknowledges education/In group clarification offered/Needs additional education  Clinical Observations/Feedback: Pt did not attend group.   Victorino Sparrow, LRT/CTRS         Ria Comment, Zein Helbing A 06/19/2017 1:11 PM

## 2017-06-19 NOTE — [facility] Group Notes (Signed)
LCSW Group Therapy Note  06/19/2017 1:15pm  Type of Therapy/Topic:  Group Therapy:  Balance in Life  Participation Level:  Did Not Attend  Description of Group:    This group will address the concept of balance and how it feels and looks when one is unbalanced. Patients will be encouraged to process areas in their lives that are out of balance and identify reasons for remaining unbalanced. Facilitators will guide patients in utilizing problem-solving interventions to address and correct the stressor making their life unbalanced. Understanding and applying boundaries will be explored and addressed for obtaining and maintaining a balanced life. Patients will be encouraged to explore ways to assertively make their unbalanced needs known to significant others in their lives, using other group members and facilitator for support and feedback.  Therapeutic Goals: 1. Patient will identify two or more emotions or situations they have that consume much of in their lives. 2. Patient will identify signs/triggers that life has become out of balance:  3. Patient will identify two ways to set boundaries in order to achieve balance in their lives:  4. Patient will demonstrate ability to communicate their needs through discussion and/or role plays  Summary of Patient Progress:      Therapeutic Modalities:   Cognitive Behavioral Therapy Solution-Focused Therapy Assertiveness Training  Trish Mage, [place] 06/19/2017 3:39 PM

## 2017-06-20 MED ORDER — CLOZAPINE 100 MG PO TABS: ORAL_TABLET | ORAL | 0 refills | Status: AC

## 2017-06-20 MED ORDER — HALOPERIDOL 5 MG PO TABS: 5.0000 mg | ORAL_TABLET | Freq: Three times a day (TID) | ORAL | 0 refills | Status: AC

## 2017-06-20 MED ORDER — DILTIAZEM HCL ER COATED BEADS 120 MG PO CP24: 120.0000 mg | ORAL_CAPSULE | Freq: Every day | ORAL | 0 refills | Status: AC

## 2017-06-20 MED ORDER — LORAZEPAM 1 MG PO TABS: 1.0000 mg | ORAL_TABLET | Freq: Four times a day (QID) | ORAL | 0 refills | Status: AC | PRN

## 2017-06-20 MED ORDER — HALOPERIDOL DECANOATE 100 MG/ML IM SOLN: 150.0000 mg | INTRAMUSCULAR | 0 refills | Status: AC

## 2017-06-20 MED ORDER — DIVALPROEX SODIUM 500 MG PO DR TAB: 500.0000 mg | DELAYED_RELEASE_TABLET | Freq: Two times a day (BID) | ORAL | 0 refills | Status: AC

## 2017-06-20 MED ORDER — NICOTINE 14 MG/24HR TD PT24: 14.0000 mg | MEDICATED_PATCH | Freq: Every day | TRANSDERMAL | 0 refills | Status: AC

## 2017-06-20 NOTE — Plan of Care (Signed)
Pt attended leisure education recreation therapy session.   Victorino Sparrow, LRT/CTRS

## 2017-06-20 NOTE — Progress Notes (Signed)
  [facility] Case Management Discharge Plan :  Will you be returning to the same living situation after discharge:  Yes,  [name] Ball At discharge, do you have transportation home?: Yes,  cab Do you have the ability to pay for your medications: Yes,  MCD  Release of information consent forms completed and in the chart;  Patient's signature needed at discharge.  Patient to Follow up at: Follow-up Information    [facility] Follow up on 06/20/2017.   Why:  Somone from the ACT team will pick you up the day of d/c. Contact information: [place] [address] [phone]           Next level of care provider has access to [name] and Suicide Prevention discussed: Yes,  yes  Have you used any form of tobacco in the last 30 days? (Cigarettes, Smokeless Tobacco, Cigars, and/or Pipes): Yes  Has patient been referred to the Quitline?: Patient refused referral  Patient has been referred for addiction treatment: [place], LCSW 06/20/2017, 1:35 PM

## 2017-06-20 NOTE — [facility] Suicide Risk Assessment (Signed)
[facility] Discharge Suicide Risk Assessment   Principal Problem: Schizophrenia [facility]) Discharge Diagnoses:  Patient Active Problem List   Diagnosis Date Noted  . Dementia with behavioral disturbance [F03.91] 06/04/2017  . Schizophrenia ([place]) [F20.9] 05/29/2017  . Bipolar disorder, current episode manic, severe with psychotic features ([place]) [F31.2] 04/03/2017  . Hepatitis C virus infection without hepatic coma [B19.20]   . Bipolar disorder ([place]) [F31.9]   . GERD (gastroesophageal reflux disease) [K21.9]     Total Time spent with patient: 30 minutes  Musculoskeletal: Strength & Muscle Tone: within normal limits Gait & Station: normal Patient leans: N/A  Psychiatric Specialty Exam: Review of Systems  Constitutional: Negative for chills and fever.  Respiratory: Negative for cough and shortness of breath.   Cardiovascular: Negative for chest pain.  Gastrointestinal: Negative for heartburn, nausea and vomiting.  Psychiatric/Behavioral: Positive for memory loss. Negative for depression, hallucinations, substance abuse and suicidal ideas. The patient is not nervous/anxious and does not have insomnia.     Blood pressure (!) 105/56, pulse 95, temperature 98.3 F (36.8 C), temperature source Oral, resp. rate 20, height 5\' 3"  (1.6 m), weight 73.9 kg (163 lb), SpO2 100 %.Body mass index is 28.87 kg/m.  General Appearance: Casual  Eye Contact::  Good  Speech:  Clear and Coherent and Normal Rate  Volume:  Normal  Mood:  Euthymic  Affect:  Appropriate, Blunt and Flat  Thought Process:  Coherent, Goal Directed and Descriptions of Associations: Loose  Orientation:  Other:  oriented to year "eighteen," states location is "[place]" but then corrects herself to [employer]"  Thought Content:  Delusions and Ideas of Reference:   Delusions  Suicidal Thoughts:  No  Homicidal Thoughts:  No  Memory:  Immediate;   Poor Recent;   Poor Remote;   Poor  Judgement:  Impaired  Insight:  Lacking   Psychomotor Activity:  Normal  Concentration:  Fair  Recall:  [place] of Knowledge:Fair  Language: Good  Akathisia:  No  Handed:    AIMS (if indicated):     Assets:  Communication Skills Physical Health Resilience Social Support  Sleep:  Number of Hours: 6.75  Cognition: WNL  ADL's:  Intact   Mental Status Per Nursing Assessment::   On Admission:  NA  Demographic Factors:  Low socioeconomic status and Unemployed  Loss Factors: Financial problems/change in socioeconomic status  Historical Factors: Impulsivity  Risk Reduction Factors:   Living with another person, especially a relative, Positive social support, Positive therapeutic relationship and Positive coping skills or problem solving skills  Continued Clinical Symptoms:  Schizophrenia:   Paranoid or undifferentiated type More than one psychiatric diagnosis Medical Diagnoses and Treatments/Surgeries  Cognitive Features That Contribute To Risk:  None    Suicide Risk:  Minimal: No identifiable suicidal ideation.  Patients presenting with no risk factors but with morbid ruminations; may be classified as minimal risk based on the severity of the depressive symptoms  Follow-up Information    [facility] Follow up on 06/20/2017.   Why:  Somone from the ACT team will pick you up the day of d/c. Contact information: [address] [place] [address] [phone]         Subjective Data: [name] Ball is a 51 y/o F with history of schizophrenia vs bipolar with psychotic features who was admitted initially to ED with worsening symptoms of psychosis including inability to take care of herself,hallucinations,and wandering from her group home.Shewas started on home medications but had ongoing agitation, so  she was transitioned to clozapine (in addition to previous home medication ofhaldol). Pt had improvement of agitation symptoms, but she continued to have baseline delusions that she  has multiple young children in the building and that she has a job with the [employer] or in the sheriff's department. She has additional cognitive difficulties and has remained disoriented to year and location, despite multiple promptings, but in the last few days she has been able to correctly state the year and correct herself to state she is in [place] despite initially answering that she is in "[place]."  She was visited by staff from her ACT team and medical team was in agreement to discharge her back to her previous group home due to improvement of sleep and agitation symptoms during her hospitalization.  Today upon evaluation, pt is doing well. She is evaluated in her room, where she was resting, but easily awakened for interview. She is pleasant, calm, and cooperative. She denies SI/HI/AH/VH. She has no specific concerns today other than asking, "Do I get to see my kids when I go?" Pt is in agreement to return to her group home. She is tolerating her medications without difficulty or side effects. She is oriented to year today, stating "eighteen." She initially states she is in [place] but then later corrects herself that she is in "[place]." She is able to engage in safety planning and agrees to return to [facility] if she feels unable to maintain her own safety. Pt had no further questions, comments, or concerns.  Plan Of Care/Follow-up recommendations:   - Schizophrenia - Continuehaldol 5mg  TID - Continue clozapine 100 mg qAM + 200 mg qhs - Continue haldol decanoate 150mg  IM q30 Days (last given11/30/18) - Continue depakote 500mg  BID - depakote level =50 on 06/09/17, therapeutic.              - Clozaril level drawn on 06-16-17, 186, subtherapeutic.      - Dementia with behavioral disturbance r/o Alzhemier's versus cognitive disorder unspecified - see above treatment recommendations   Activity:  as  tolerated Diet:  normal Tests:  clozapine level as outpatient Other:  see above for [place], MD 06/20/2017, 10:21 AM

## 2017-06-20 NOTE — Discharge Summary (Signed)
Physician Discharge Summary Note  Patient:  [name] Ball is an 51 y.o., female  MRN:  [id number]  DOB:  1966/03/01  Patient phone:  [phone] (home)   Patient address:   [address] [address],   Total Time spent with patient: Greater than 30 minutes  Date of Admission:  05/29/2017 Date of Discharge: 06-20-17  Reason for Admission: Worsening psychosis  manic episodes & disorganized behavior.  Principal Problem: Schizophrenia [facility])  Discharge Diagnoses: Patient Active Problem List   Diagnosis Date Noted  . Dementia with behavioral disturbance [F03.91] 06/04/2017  . Schizophrenia ([place]) [F20.9] 05/29/2017  . Bipolar disorder, current episode manic, severe with psychotic features ([place]) [F31.2] 04/03/2017  . Hepatitis C virus infection without hepatic coma [B19.20]   . Bipolar disorder ([place]) [F31.9]   . GERD (gastroesophageal reflux disease) [K21.9]    Past Psychiatric History: Bipolar disorder, current episode, manic severe with psychotic features  Past Medical History:  Past Medical History:  Diagnosis Date  . Bipolar disorder ([place])   . GERD (gastroesophageal reflux disease)   . Hepatitis C virus infection without hepatic coma     Past Surgical History:  Procedure Laterality Date  . abdominal cyst removed    . CESAREAN SECTION    . COLON SURGERY     Family History: History reviewed. No pertinent family history.  Family Psychiatric  History: See H&P  Social History:  Social History   Substance and Sexual Activity  Alcohol Use No     Social History   Substance and Sexual Activity  Drug Use No    Social History   Socioeconomic History  . Marital status: Unknown    Spouse name: None  . Number of children: None  . Years of education: None  . Highest education level: None  Social Needs  . Financial resource strain: None  . Food insecurity - worry: None  . Food insecurity - inability: None  . Transportation needs -  medical: None  . Transportation needs - non-medical: None  Occupational History  . None  Tobacco Use  . Smoking status: Current Every Day Smoker    Packs/day: 1.00    Types: Cigarettes  . Smokeless tobacco: Never Used  Substance and Sexual Activity  . Alcohol use: No  . Drug use: No  . Sexual activity: None  Other Topics Concern  . None  Social History Narrative  . None   Hospital Course: [name] Ball is a 51 y/o F with history of schizophrenia vs bipolar with psychotic features who was admitted initially to ED with worsening symptoms of psychosis including inability to take care of herself, speaking about things that were not occurring, rambling, and wandering off from her group home. Pt has a guardian and an ACT team. She was restarted on her medications and transferred to [facility] for additional evaluation. Pt has recent relevant history of discharge from [facility] on 04/26/17 to her group home. Since that time her medications were continued with the addition of clonazepam to her discharge regimen.  [name] Ball was recent discharged from this hospital after receiving mood stabilization treatments due to worsening psychosis, manic episodes & disorganized behavior. Her stay in this hospital was lengthy due to severity of her symptoms. It required quite a lot of medication changes & adjustment to meet her needs at the time. After her symptoms improved, [name] Ball was discharged to the [facility]. She was recommended to continue mental health care on outpatient basis. However, she was re-admitted  to the [facility] with similar symptoms requiring further mood stabilization treatments.  After evaluation of her symptoms, it was determined based on her presenting symptoms that [name] Ball will need medication regimen to re-stabilize her worsening mood symptoms. Chart review has indicated a patient with chronic mental illness & has been receiving mental health treatment for a long time. She also resides in a group  home setting & has an Act Team that over seas her mental health care & treatment.   During the early course of this present hospitalization, [name] Ball presented daily with pressured speech, tangential/circumstantial stories, delusional thinking, hallucinations & fixation on military issues. It took quite some time with medication cahnges & adjustments, redirections & support from staff to get her symptoms under control again. She was evaluated daily by the treatment team for her response to her treatment regimen. She was medicated & discharged on; Clozaril 100 mg in am & 200 mg Q bedtime for mood control, Depakote DR 500 mg bid mg for mood stabilization, Haldol tablet 5 mg tid, Haldol Decanoate 100 mg Q 30 days (due on 06-30-17) for mood control, Nicotine patch 14 mg for nicotine withdrawal symptoms. She no other medication regimen as she presented no other medical issues that required treatment.[name] Ball was enrolled & seldom participated in the group counseling sessions being offered and held on this unit. She learned some coping skills that should help her cope better and maintain mood stability after discharge.   As it was listed on her discharge medication regimen, [name] Ball is currently being discharged to her place of resident as her mood has stabilized. However, she is going home on 2 separate antipsychotic medications (Clozaril tablets, Haldol tablets & Haldol Decanoate monthly injectable). This is because her symptoms did not respond effectively under an antipsychotic monotherapy.The failed antipsychotic monotherapies include; Haldol, Risperdal & Abilify. However, the combination therapy of haldol & Clozaril seem to have helped improve her symptoms. As a result, it is necessary for [name] Ball to continue on these combination therapies as recommended to maintain maximum symptom control after discharge. However, in the event that her symptoms improve, she can then be titrated down to an antipsychotic monotherapy  to avoid risks of metabolic syndrome and other related adverse effects associated with use multiple antipsychotic medications.This has to be done within the proper evaluation & judgement of her outpatient provider.   After discharge & going forward, [name] Ball will follow-up care for routine psychiatric care & medication management as noted below on an outpatient basis with her ACT Team. She is provided with all the necessary information required to make these appointments without problems. Upon discharge, she adamantly denies any SIHI, AVH, delusional thoughts and or paranoia.  She left [facility] with all personal belongings in no distress. Transportation per her Cab. [facility] assisted with cab fare.Marland Kitchen   Physical Findings: AIMS: Facial and Oral Movements Muscles of Facial Expression: None, normal Lips and Perioral Area: None, normal Jaw: None, normal Tongue: None, normal,Extremity Movements Upper (arms, wrists, hands, fingers): None, normal Lower (legs, knees, ankles, toes): None, normal, Trunk Movements Neck, shoulders, hips: None, normal, Overall Severity Severity of abnormal movements (highest score from questions above): None, normal Incapacitation due to abnormal movements: None, normal Patient's awareness of abnormal movements (rate only patient's report): No Awareness, Dental Status Current problems with teeth and/or dentures?: No Does patient usually wear dentures?: No  CIWA:  CIWA-Ar Total: 1 COWS:  COWS Total Score: 3  Musculoskeletal: Strength & Muscle Tone: within normal limits Gait & Station: normal Patient leans:  N/A  Psychiatric Specialty Exam: Physical Exam  Constitutional: She appears well-developed.  HENT:  Head: Normocephalic.  Eyes: Pupils are equal, round, and reactive to light.  Neck: Normal range of motion.  Cardiovascular:  Elevated pulse rate  Respiratory: Effort normal.  GI: Soft.  Genitourinary:  Genitourinary Comments: Deferred  Musculoskeletal: Normal range of  motion.  Skin: Skin is warm.    Review of Systems  Constitutional: Negative.   HENT: Negative.   Eyes: Negative.   Respiratory: Negative.   Cardiovascular: Negative.   Gastrointestinal: Negative.   Genitourinary: Negative.   Musculoskeletal: Negative.   Skin: Negative.   Neurological: Negative.   Endo/Heme/Allergies: Negative.   Psychiatric/Behavioral: Positive for depression (Stabilized with medication prior to discharge), hallucinations (Hx. Psychosis, delusional thoughts) and substance abuse (Hx. Benzodiazepine use ). Negative for memory loss and suicidal ideas. The patient has insomnia (Stabilized with medication prior to discharge). The patient is not nervous/anxious.     Blood pressure (!) 105/56, pulse 95, temperature 98.3 F (36.8 C), temperature source Oral, resp. rate 20, height 5\' 3"  (1.6 m), weight 73.9 kg (163 lb), SpO2 100 %.Body mass index is 28.87 kg/m.  See Md's SRA   Have you used any form of tobacco in the last 30 days? (Cigarettes, Smokeless Tobacco, Cigars, and/or Pipes): Yes  Has this patient used any form of tobacco in the last 30 days? (Cigarettes, Smokeless Tobacco, Cigars, and/or Pipes):Yes, recommended nicotine prescription 7 mg for smoking cessation.  Blood Alcohol level:  Lab Results  Component Value Date   [facility] <5 05/23/2017   ETH <5 65/78/4696   Metabolic Disorder Labs:  Lab Results  Component Value Date   HGBA1C 4.6 (L) 04/05/2017   MPG 85.32 04/05/2017   Lab Results  Component Value Date   PROLACTIN 50.6 (H) 04/05/2017   Lab Results  Component Value Date   CHOL 130 04/05/2017   TRIG 51 04/05/2017   HDL 57 04/05/2017   CHOLHDL 2.3 04/05/2017   VLDL 10 04/05/2017   LDLCALC 63 04/05/2017   LDLCALC 89 05/03/2012   See Psychiatric Specialty Exam and Suicide Risk Assessment completed by Attending Physician prior to discharge.  Discharge destination:  Other:  Group Home: [name] Ball  Is patient on multiple antipsychotic  therapies at discharge:  Yes,   Do you recommend tapering to monotherapy for antipsychotics?  Yes   Has Patient had three or more failed trials of antipsychotic monotherapy by history:  Yes,   Antipsychotic medications that previously failed include:   1.  Haldol., 2.  Risperdal. and 3.  Abilify.  Recommended Plan for Multiple Antipsychotic Therapies: And because [name] Ball symptoms has not been able to achieve symptoms control under an antipsychotic monotherapy, she is currently receiving and being discharged on 2 separate antipsychotic medications Haldol decoanate & Abilify which seem effective in controlling his symptoms at this time. It will benefit patient to continue on these combination antipsychotic therapies as recommended. However, as his symptoms continue to improve, patient may be titrated down to an antipsychotic monotherapy. This is to decrease the chances for development of metabolic syndrome associated with use of multiple antipsychotic therapies. This has to be done within the discretion and proper judgement of her outpatient psychiatric provider.   Allergies as of 06/20/2017      Reactions   Trazodone And Nefazodone    Pt stated doesn't make her feel good      Medication List    STOP taking these medications   ARIPiprazole 20 MG tablet  Commonly known as:  ABILIFY   benztropine 0.5 MG tablet Commonly known as:  COGENTIN   clonazePAM 1 MG tablet Commonly known as:  KLONOPIN   diltiazem 120 MG 24 hr capsule Commonly known as:  DILACOR XR Replaced by:  diltiazem 120 MG 24 hr capsule   ibuprofen 200 MG tablet Commonly known as:  ADVIL,MOTRIN   nicotine 7 mg/24hr patch Commonly known as:  NICODERM CQ - dosed in mg/24 hr Replaced by:  nicotine 14 mg/24hr patch   pantoprazole 20 MG tablet Commonly known as:  PROTONIX   predniSONE 10 MG tablet Commonly known as:  DELTASONE   QUEtiapine 25 MG tablet Commonly known as:  SEROQUEL   zolpidem 5 MG tablet Commonly  known as:  AMBIEN     TAKE these medications     Indication  cloZAPine 100 MG tablet Commonly known as:  CLOZARIL Take 1 tablet (100 mg) by mouth in the morning & 2 tablets (200 mg) at bedtime: For mood control  Indication:  Mood control   diltiazem 120 MG 24 hr capsule Commonly known as:  CARDIZEM CD Take 1 capsule (120 mg total) by mouth daily. For high blood pressure Start taking on:  06/21/2017 Replaces:  diltiazem 120 MG 24 hr capsule  Indication:  High Blood Pressure Disorder   divalproex 500 MG DR tablet Commonly known as:  DEPAKOTE Take 1 tablet (500 mg total) by mouth 2 (two) times daily. For mood stabilization What changed:    how much to take  when to take this  Indication:  Mood stabilization   haloperidol 5 MG tablet Commonly known as:  HALDOL Take 1 tablet (5 mg total) by mouth 3 (three) times daily. For mood control What changed:  when to take this  Indication:  Mood control   haloperidol decanoate 100 MG/ML injection Commonly known as:  HALDOL DECANOATE Inject 1.5 mLs (150 mg total) into the muscle every 30 (thirty) days. (Due on 06-30-17): For mood control Start taking on:  06/30/2017 What changed:  additional instructions  Indication:  Mood control   LORazepam 1 MG tablet Commonly known as:  ATIVAN Take 1 tablet (1 mg total) by mouth every 6 (six) hours as needed for anxiety.  Indication:  Agitation   nicotine 14 mg/24hr patch Commonly known as:  NICODERM CQ - dosed in mg/24 hours Place 1 patch (14 mg total) onto the skin daily. For smoking cessation Start taking on:  06/21/2017 Replaces:  nicotine 7 mg/24hr patch  Indication:  Nicotine Addiction      Follow-up Information    [facility] Follow up on 06/20/2017.   Why:  Somone from the ACT team will pick you up the day of d/c. Contact information: [name] Ball [place] [address] [phone]          Follow-up recommendations: Activity:  As  tolerated Diet: As recommended by your primary care doctor. Keep all scheduled follow-up appointments as recommended.    Comments: Patient is instructed prior to discharge to: Take all medications as prescribed by his/her mental healthcare provider. Report any adverse effects and or reactions from the medicines to his/her outpatient provider promptly. Patient has been instructed & cautioned: To not engage in alcohol and or illegal drug use while on prescription medicines. In the event of worsening symptoms, patient is instructed to call the crisis hotline, 911 and or go to the nearest ED for appropriate evaluation and treatment of symptoms. To follow-up with his/her primary care provider  for your other medical issues, concerns and or health care needs.   Signed: Lindell Spar, NP, PMHNP, FNP-BC 06/20/2017, 2:12 PM    Patient seen, Suicide Assessment Completed.  Disposition Plan Reviewed   [name] Ball is a 51 y/o F with history of schizophrenia vs bipolar with psychotic features who was admitted initially to ED with worsening symptoms of psychosis including inability to take care of herself,hallucinations,and wandering from her group home.Shewas started on home medications but had ongoing agitation, so she was transitioned toclozapine (in addition to previous home medication ofhaldol). 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 She has additional cognitive difficulties and has remained disoriented to year and location, despite multiple promptings, but in the last few days she has been able to correctly state the year and correct herself to state she is in [place] despite initially answering that she is in "[place]."  She was visited by staff from her ACT team and medical team was in agreement to discharge her back to her  previous group home due to improvement of sleep and agitation symptoms during her hospitalization.  Today upon evaluation, pt is doing well. She is evaluated in her room, where she was resting, but easily awakened for interview. She is pleasant, calm, and cooperative. She denies SI/HI/AH/VH. She has no specific concerns today other than asking, "Do I get to see my kids when I go?" Pt is in agreement to return to her group home. She is tolerating her medications without difficulty or side effects. She is oriented to year today, stating "eighteen." She initially states she is in [place] but then later corrects herself that she is in "[place]." She is able to engage in safety planning and agrees to return to [facility] if she feels unable to maintain her own safety. Pt had no further questions, comments, or concerns.  Plan Of Care/Follow-up recommendations:   - Schizophrenia - Continuehaldol 5mg  TID - Continue clozapine 100 mg qAM + 200 mg qhs - Continue haldol decanoate 150mg  IM q30 Days (last given11/30/18) - Continue depakote 500mg  BID - depakote level =50 on 06/09/17, therapeutic. - Clozaril level drawn on 06-16-17, 186, subtherapeutic.  - Dementia with behavioral disturbance r/o Alzhemier's versus cognitive disorder unspecified - see above treatment recommendations   Activity:  as tolerated Diet:  normal Tests:  clozapine level as outpatient Other:  see above for DC plan  Pennelope Bracken, MD

## 2017-06-20 NOTE — Progress Notes (Signed)
Recreation Therapy Notes  INPATIENT RECREATION TR PLAN  Patient Details Name: [name] Ball MRN: [id number] DOB: Jan 24, 1966 Today's Date: 06/20/2017  Rec Therapy Plan Is patient appropriate for Therapeutic Recreation?: Yes Treatment times per week: about 3 days Estimated Length of Stay: 5-7 days TR Treatment/Interventions: Group participation (Comment)  Discharge Criteria Pt will be discharged from therapy if:: Discharged Treatment plan/goals/alternatives discussed and agreed upon by:: Patient/family  Discharge Summary Short term goals set: Pt will attend and participate in Recreation Therapy Group Sessions. Short term goals met: Adequate for discharge Progress toward goals comments: Groups attended Which groups?: Leisure education Reason goals not met: Pt attended one group but did not participate. Therapeutic equipment acquired: N/A Reason patient discharged from therapy: Discharge from hospital Pt/family agrees with progress & goals achieved: Yes Date patient discharged from therapy: 06/20/17     Victorino Sparrow, LRT/CTRS   Ria Comment, Belicia Difatta A 06/20/2017, 1:06 PM

## 2017-06-20 NOTE — Progress Notes (Signed)
Recreation Therapy Notes  Date: 06/20/17 Time: 1000 Location: [address] Dayroom   Group Topic: Communication, Team Building, Problem Solving  Goal Area(s) Addresses:  Patient will effectively work with peer towards shared goal.  Patient will identify skill used to make activity successful.  Patient will identify how skills used during activity can be used to reach post d/c goals.   Intervention: STEM Activity   Activity: [employer]. Patients were provided the following materials: 5 drinking straws, 5 rubber bands, 5 paper clips, 2 index cards, 2 drinking cups, and 2 toilet paper rolls. Using the provided materials patients were asked to build a launching mechanisms to launch a ping pong ball approximately 12 feet. Patients were divided into teams of 3-5.   Education: Education officer, community, Dentist.   Education Outcome: Acknowledges education/In group clarification offered/Needs additional education.   Clinical Observations/Feedback:  Pt did not attend group.    Victorino Sparrow, LRT/CTRS         Victorino Sparrow A 06/20/2017 12:35 PM

## 2017-06-20 NOTE — Progress Notes (Addendum)
Patient verbalizes readiness for discharge. Follow up plan explained, AVS, transition record and SRA given along with prescriptions.  All belongings returned. Refused to complete Suicide Safety Plan. Patient verbalizes understanding. Denies SI/HI and assures this Probation officer she will notify [facility] staff should that change. Patient discharged ambulatory and in stable condition to [facility] (voucher given.) Driver given instructions to drive directly to [facility]. [place] notified of patient's pending arrival ([phone].)

## 2017-06-20 NOTE — Progress Notes (Addendum)
D: Patient observed resting in bed, awakens easily. Requests AM meds be brought to her. Patient denies any incontinence, loose stools. Dressed in scrubs and hygiene improved though patient remains disheleved. Patient's affect animated, mood pleasant.  Denies pain, physical complaints.   A: Medicated per orders, no prns requested or required. Level III obs in place for safety. Emotional support offered. Encouraged completion of Suicide Safety Plan, self inventory and programming participation. Discussed POC with MD, SW.  Fall prevention plan in place and reviewed with patient as pt is a high fall risk due to hx of falls.   R: Patient verbalizes understanding of POC, falls prevention education. Patient denies SI/HI/AVH and remains safe on level III obs. Will continue to monitor closely and make verbal contact frequently.  Identified for discharge today.

## 2020-01-30 ENCOUNTER — Emergency Department (HOSPITAL_COMMUNITY)
Admission: EM | Admit: 2020-01-30 | Discharge: 2020-01-30 | Disposition: A | Discharge status: Home / Self Care | Payer: Medicaid Other | Attending: Emergency Medicine | Admitting: Emergency Medicine

## 2020-01-30 ENCOUNTER — Other Ambulatory Visit: Payer: Self-pay

## 2020-01-30 ENCOUNTER — Encounter (HOSPITAL_COMMUNITY): Payer: Self-pay | Admitting: Emergency Medicine

## 2020-01-30 ENCOUNTER — Emergency Department (HOSPITAL_COMMUNITY): Payer: Medicaid Other

## 2020-01-30 DIAGNOSIS — R05 Cough: Secondary | ICD-10-CM | POA: Diagnosis not present

## 2020-01-30 DIAGNOSIS — R079 Chest pain, unspecified: Secondary | ICD-10-CM | POA: Diagnosis not present

## 2020-01-30 DIAGNOSIS — R0602 Shortness of breath: Secondary | ICD-10-CM | POA: Diagnosis not present

## 2020-01-30 DIAGNOSIS — F17210 Nicotine dependence, cigarettes, uncomplicated: Secondary | ICD-10-CM | POA: Diagnosis not present

## 2020-01-30 LAB — COMPREHENSIVE METABOLIC PANEL
ALT: 15 U/L (ref 0–44)
AST: 21 U/L (ref 15–41)
Albumin: 4.1 g/dL (ref 3.5–5.0)
Alkaline Phosphatase: 87 U/L (ref 38–126)
Anion gap: 11 (ref 5–15)
BUN: 14 mg/dL (ref 6–20)
CO2: 25 mmol/L (ref 22–32)
Calcium: 9.3 mg/dL (ref 8.9–10.3)
Chloride: 105 mmol/L (ref 98–111)
Creatinine, Ser: 0.7 mg/dL (ref 0.44–1.00)
GFR calc Af Amer: >60 mL/min (ref >60)
GFR calc non Af Amer: >60 mL/min (ref >60)
Glucose, Bld: 97 mg/dL (ref 70–99)
Potassium: 3.9 mmol/L (ref 3.5–5.1)
Sodium: 141 mmol/L (ref 135–145)
Total Bilirubin: 0.5 mg/dL (ref 0.3–1.2)
Total Protein: 7.2 g/dL (ref 6.5–8.1)

## 2020-01-30 LAB — CBC
HCT: 42.6 % (ref 36.0–46.0)
Hemoglobin: 13.5 g/dL (ref 12.0–15.0)
MCH: 29 pg (ref 26.0–34.0)
MCHC: 31.7 g/dL (ref 30.0–36.0)
MCV: 91.6 fL (ref 80.0–100.0)
Platelets: 231 10*3/uL (ref 150–400)
RBC: 4.65 MIL/uL (ref 3.87–5.11)
RDW: 13.8 % (ref 11.5–15.5)
WBC: 6.8 10*3/uL (ref 4.0–10.5)
nRBC: 0 % (ref 0.0–0.2)

## 2020-01-30 LAB — MAGNESIUM: Magnesium: 2 mg/dL (ref 1.7–2.4)

## 2020-01-30 LAB — BRAIN NATRIURETIC PEPTIDE: B Natriuretic Peptide: 43 pg/mL (ref 0.0–100.0)

## 2020-01-30 LAB — TROPONIN I (HIGH SENSITIVITY): Troponin I (High Sensitivity): 3 ng/L (ref <18)

## 2020-01-30 NOTE — ED Provider Notes (Signed)
[facility] EMERGENCY DEPARTMENT Provider Note   CSN: [id number] Arrival date & time: 01/30/20  1933     History Chief Complaint  Patient presents with  . Shortness of Breath    [name] Ball is a 54 y.o. female.  HPI 54 year old female presents with chest pain.  Patient is an overall poor historian.  She describes as spasms.  Ongoing for 2 or 3 weeks, usually happens every other day for several hours.  She also endorses shortness of breath and states has been going on for about 1 year or more.  Some cough with white sputum for a year.  No fevers.  No leg swelling. Some orthopnea.    Past Medical History:  Diagnosis Date  . Bipolar disorder ([place])   . GERD (gastroesophageal reflux disease)   . Hepatitis C virus infection without hepatic coma     Patient Active Problem List   Diagnosis Date Noted  . Dementia with behavioral disturbance ([place]) 06/04/2017  . Schizophrenia ([place]) 05/29/2017  . Bipolar disorder, current episode manic, severe with psychotic features ([place]) 04/03/2017  . Hepatitis C virus infection without hepatic coma   . Bipolar disorder ([place])   . GERD (gastroesophageal reflux disease)     Past Surgical History:  Procedure Laterality Date  . abdominal cyst removed    . CESAREAN SECTION    . COLON SURGERY       OB History   No obstetric history on file.     No family history on file.  Social History   Tobacco Use  . Smoking status: Current Every Day Smoker    Packs/day: 1.00    Types: Cigarettes  . Smokeless tobacco: Never Used  Substance Use Topics  . Alcohol use: No  . Drug use: No    Home Medications Prior to Admission medications   Medication Sig Start Date End Date Taking? Authorizing Provider  cloZAPine (CLOZARIL) 100 MG tablet Take 1 tablet (100 mg) by mouth in the morning & 2 tablets (200 mg) at bedtime: For mood control 06/20/17   Lindell Spar I, NP  diltiazem (CARDIZEM CD) 120 MG 24 hr capsule Take 1 capsule (120 mg total) by mouth  daily. For high blood pressure 06/21/17   Lindell Spar I, NP  divalproex (DEPAKOTE) 500 MG DR tablet Take 1 tablet (500 mg total) by mouth 2 (two) times daily. For mood stabilization 06/20/17   Lindell Spar I, NP  haloperidol (HALDOL) 5 MG tablet Take 1 tablet (5 mg total) by mouth 3 (three) times daily. For mood control 06/20/17   Lindell Spar I, NP  haloperidol decanoate (HALDOL DECANOATE) 100 MG/ML injection Inject 1.5 mLs (150 mg total) into the muscle every 30 (thirty) days. (Due on 06-30-17): For mood control 06/30/17   Nwoko, Herbert Pun I, NP  LORazepam (ATIVAN) 1 MG tablet Take 1 tablet (1 mg total) by mouth every 6 (six) hours as needed for anxiety. 06/20/17   Lindell Spar I, NP  nicotine (NICODERM CQ - DOSED IN MG/24 HOURS) 14 mg/24hr patch Place 1 patch (14 mg total) onto the skin daily. For smoking cessation 06/21/17   Lindell Spar I, NP    Allergies    Trazodone and nefazodone  Review of Systems   Review of Systems  Constitutional: Negative for fever.  Respiratory: Positive for cough and shortness of breath.   Cardiovascular: Positive for chest pain. Negative for leg swelling.  All other systems reviewed and are negative.   Physical Exam Updated Vital Signs BP Marland Kitchen)  145/70   Pulse 86   Temp 98.2 F (36.8 C) (Oral)   Resp 22   Ht 5\' 4"  (1.626 m)   Wt 90.7 kg   SpO2 97%   BMI 34.33 kg/m   Physical Exam Vitals and nursing note reviewed.  Constitutional:      General: She is not in acute distress.    Appearance: She is well-developed. She is not ill-appearing or diaphoretic.  HENT:     Head: Normocephalic and atraumatic.     Right Ear: External ear normal.     Left Ear: External ear normal.     Nose: Nose normal.  Eyes:     General:        Right eye: No discharge.        Left eye: No discharge.  Cardiovascular:     Rate and Rhythm: Regular rhythm. Tachycardia present.     Heart sounds: Normal heart sounds.     Comments: HR~100, occasional paired PVCs Pulmonary:      Effort: Pulmonary effort is normal.     Breath sounds: Examination of the right-lower field reveals rales. Examination of the left-lower field reveals rales. Rales (mild at the bases) present.  Abdominal:     Palpations: Abdomen is soft.     Tenderness: There is no abdominal tenderness.  Musculoskeletal:     Right lower leg: No edema.     Left lower leg: No edema.  Skin:    General: Skin is warm and dry.  Neurological:     Mental Status: She is alert.  Psychiatric:        Mood and Affect: Mood is not anxious.     ED Results / Procedures / Treatments   Labs (all labs ordered are listed, but only abnormal results are displayed) Labs Reviewed  CBC  COMPREHENSIVE METABOLIC PANEL  BRAIN NATRIURETIC PEPTIDE  MAGNESIUM  TROPONIN I (HIGH SENSITIVITY)    EKG EKG Interpretation  Date/Time:  Saturday January 30 2020 19:44:50 EDT Ventricular Rate:  90 PR Interval:    QRS Duration: 84 QT Interval:  375 QTC Calculation: 459 R Axis:   -17 Text Interpretation: Sinus tachycardia Ventricular trigeminy Consider right atrial enlargement Borderline left axis deviation Low voltage, precordial leads Nonspecific T abnormalities, lateral leads Confirmed by Sherwood Gambler [id number]) on 01/30/2020 8:04:27 PM   Radiology DG Chest 2 View  Result Date: 01/30/2020 CLINICAL DATA:  Shortness of breath. Chest pain. Patient reports symptoms for 1 month. EXAM: CHEST - 2 VIEW COMPARISON:  None. FINDINGS: Upper normal heart size with normal mediastinal contours. Mild vascular congestion without pulmonary edema. Subsegmental opacity in the left mid lung favor scarring or atelectasis. No confluent airspace disease. No pleural effusion. No pneumothorax. No osseous abnormalities are seen. IMPRESSION: Mild vascular congestion. Electronically Signed   By: Keith Rake M.D.   On: 01/30/2020 20:27    Procedures Procedures (including critical care time)  Medications Ordered in ED Medications - No data to  display  ED Course  I have reviewed the triage vital signs and the nursing notes.  Pertinent labs & imaging results that were available during my care of the patient were reviewed by me and considered in my medical decision making (see chart for details).    MDM Rules/Calculators/A&P                          Patient's symptoms sound chronic. Has some occasional PVCs. Otherwise, workup is pretty benign besides some  vascular congestion without edema. No other findings to be concerned with CHF, ACS, etc. I highly doubt PE given chronicity of symptoms. Appears to not have an emergent condition. Stable for discharge and outpatient follow up.  Final Clinical Impression(s) / ED Diagnoses Final diagnoses:  SOB (shortness of breath)    Rx / DC Orders ED Discharge Orders    None       Sherwood Gambler, MD 01/31/20 [phone]

## 2020-01-30 NOTE — ED Notes (Signed)
Ambulated pt. 100 ft. Pts. O2 sats stayed at 97 on room air.

## 2020-01-30 NOTE — ED Triage Notes (Signed)
Pt arrives via [place] from [facility]. Pt C/o SOB and CP x 1 month. Pt reports using an inhaler with no relief. Pt states the SOB is worse with lying.

## 2020-01-30 NOTE — Discharge Instructions (Signed)
If you develop recurrent, continued, or worsening chest pain, shortness of breath, fever, vomiting, abdominal or back pain, or any other new/concerning symptoms then return to the ER for evaluation.  

## 2020-12-06 ENCOUNTER — Ambulatory Visit: Payer: Medicaid Other | Admitting: Gastroenterology

## 2021-02-07 ENCOUNTER — Other Ambulatory Visit: Payer: Self-pay

## 2021-02-07 ENCOUNTER — Encounter: Payer: Self-pay | Admitting: Gastroenterology

## 2021-02-07 ENCOUNTER — Ambulatory Visit (INDEPENDENT_AMBULATORY_CARE_PROVIDER_SITE_OTHER): Payer: Medicaid Other | Admitting: Gastroenterology

## 2021-02-07 VITALS — BP 120/83 | HR 98 | Temp 98.7°F | Ht 64.0 in | Wt 194.0 lb

## 2021-02-07 DIAGNOSIS — K56609 Unspecified intestinal obstruction, unspecified as to partial versus complete obstruction: Secondary | ICD-10-CM | POA: Diagnosis not present

## 2021-02-07 DIAGNOSIS — B1920 Unspecified viral hepatitis C without hepatic coma: Secondary | ICD-10-CM | POA: Diagnosis not present

## 2021-02-07 DIAGNOSIS — B182 Chronic viral hepatitis C: Secondary | ICD-10-CM

## 2021-02-07 MED ORDER — NA SULFATE-K SULFATE-MG SULF 17.5-3.13-1.6 GM/177ML PO SOLN: 1.0000 | Freq: Once | ORAL | 0 refills | Status: AC

## 2021-02-07 NOTE — Progress Notes (Addendum)
Gastroenterology Consultation  Referring Provider:     Burnadette Peter, MD Primary Care Physician:  Duffy Rhody, FNP Primary Gastroenterologist:  Dr. Servando Snare     Reason for Consultation:     Small bowel obstruction        HPI:   [name] Ball is a 55 y.o. y/o female referred for consultation & management of Small bowel obstruction by Dr. Duffy Rhody, FNP.  This patient comes in today with her daughter for a history of small bowel obstruction.  This patient saw me a few years ago with a diagnosis of hepatitis C for which she never seems to have been treated for.  The patient comes with the daughter who states that the patient lives in a group home.  The daughter reports that the mother was in [place] for a small bowel obstruction that was treated conservatively with a NG tube.  She was then told to follow up with GI.  The daughter states there are multiple people the family who have had colon cancer and that her mother has never had a colonoscopy.  There is no report of any unexplained weight loss fevers chills nausea vomiting black stools or bloody stools.  There is also no report of any abdominal pain.  The patient also reports that she had colonic surgery after having colon damage during a GYN procedure.  Past Medical History:  Diagnosis Date   Bipolar disorder ([facility])    GERD (gastroesophageal reflux disease)    Hepatitis C virus infection without hepatic coma     Past Surgical History:  Procedure Laterality Date   abdominal cyst removed     CESAREAN SECTION     COLON SURGERY      Prior to Admission medications   Medication Sig Start Date End Date Taking? Authorizing Provider  divalproex (DEPAKOTE) 500 MG DR tablet Take 1 tablet (500 mg total) by mouth 2 (two) times daily. For mood stabilization 06/20/17  Yes Nwoko, Nicole Kindred I, NP  donepezil (ARICEPT) 5 MG tablet Take 5 mg by mouth at bedtime.   Yes [provider]  haloperidol (HALDOL) 5 MG tablet Take 1  tablet (5 mg total) by mouth 3 (three) times daily. For mood control 06/20/17  Yes Nwoko, Nicole Kindred I, NP  haloperidol decanoate (HALDOL DECANOATE) 100 MG/ML injection Inject 1.5 mLs (150 mg total) into the muscle every 30 (thirty) days. (Due on 06-30-17): For mood control 06/30/17  Yes Nwoko, Nicole Kindred I, NP  meclizine (ANTIVERT) 25 MG tablet Take 25 mg by mouth 2 (two) times daily.   Yes [provider]  pantoprazole (PROTONIX) 20 MG tablet Take 20 mg by mouth daily. 01/30/21  Yes [provider]  benztropine (COGENTIN) 1 MG tablet Take 1 mg by mouth 2 (two) times daily. Patient not taking: Reported on 02/07/2021 01/30/21   [provider]  cloZAPine (CLOZARIL) 100 MG tablet Take 1 tablet (100 mg) by mouth in the morning & 2 tablets (200 mg) at bedtime: For mood control Patient not taking: Reported on 02/07/2021 06/20/17   Armandina Stammer I, NP  diltiazem (CARDIZEM CD) 120 MG 24 hr capsule Take 1 capsule (120 mg total) by mouth daily. For high blood pressure Patient not taking: Reported on 02/07/2021 06/21/17   Armandina Stammer I, NP  LORazepam (ATIVAN) 1 MG tablet Take 1 tablet (1 mg total) by mouth every 6 (six) hours as needed for anxiety. Patient not taking: Reported on 02/07/2021 06/20/17   Sanjuana Kava, NP  Na Sulfate-K  Sulfate-Mg Sulf 17.5-3.13-1.6 GM/177ML SOLN Take 1 kit by mouth once for 1 dose. 02/07/21 02/07/21  Lucilla Lame, MD  nicotine (NICODERM CQ - DOSED IN MG/24 HOURS) 14 mg/24hr patch Place 1 patch (14 mg total) onto the skin daily. For smoking cessation Patient not taking: Reported on 02/07/2021 06/21/17   Encarnacion Slates, NP  PROAIR HFA [phone] Base) MCG/ACT inhaler Inhale into the lungs. Patient not taking: Reported on 02/07/2021 01/27/21   [provider]  SYMBICORT 160-4.5 MCG/ACT inhaler Inhale into the lungs. Patient not taking: Reported on 02/07/2021 01/27/21   [provider]  Vitamin D, Ergocalciferol, (DRISDOL) 1.25 MG (50000 UNIT) CAPS capsule Take by  mouth. Patient not taking: Reported on 02/07/2021 01/30/21   [provider]    History reviewed. No pertinent family history.   Social History   Tobacco Use   Smoking status: Every Day    Packs/day: 1.00    Types: Cigarettes   Smokeless tobacco: Never  Substance Use Topics   Alcohol use: No   Drug use: No    Allergies as of 02/07/2021 - Review Complete 02/07/2021  Allergen Reaction Noted   Trazodone and nefazodone  03/15/2016    Review of Systems:    All systems reviewed and negative except where noted in HPI.   Physical Exam:  BP 120/83 (BP Location: Left Arm, Patient Position: Sitting)   Pulse 98   Temp 98.7 F (37.1 C)   Ht $R'5\' 4"'FY$  (1.626 m)   Wt 194 lb (88 kg)   BMI 33.30 kg/m  No LMP recorded. Patient is postmenopausal. General:   Alert,  Well-developed, well-nourished, pleasant and cooperative in NAD Head:  Normocephalic and atraumatic. Eyes:  Sclera clear, no icterus.   Conjunctiva pink. Ears:  Normal auditory acuity. Neck:  Supple; no masses or thyromegaly. Lungs:  Respirations even and unlabored.  Clear throughout to auscultation.   No wheezes, crackles, or rhonchi. No acute distress. Heart:  Regular rate and rhythm; no murmurs, clicks, rubs, or gallops. Abdomen:  Normal bowel sounds.  No bruits.  Soft, non-tender and non-distended without masses, hepatosplenomegaly or hernias noted.  No guarding or rebound tenderness.  Negative Carnett sign.   Rectal:  Deferred.  Pulses:  Normal pulses noted. Extremities:  No clubbing or edema.  No cyanosis. Neurologic:  Alert and oriented x3;  grossly normal neurologically. Skin:  Intact without significant lesions or rashes.  No jaundice. Lymph Nodes:  No significant cervical adenopathy. Psych:  Alert and cooperative. Normal mood and affect.  Imaging Studies: No results found.  Assessment and Plan:   [name] Ball is a 55 y.o. y/o female who comes in with what sounds like a small bowel obstruction from  adhesions that was treated conservatively.  The patient has not had a colonoscopy and will be set up for colonoscopy for screening purposes.  The patient also does not appear to have been treated for her hepatitis C.  I will send off labs to see where she stands in regards to her hepatitis C and she will be treated accordingly.  The patient will follow up at the time of the colonoscopy.  The patient has been explained the plan and agrees with it.    Lucilla Lame, MD. Marval Regal    Note: This dictation was prepared with Dragon dictation along with smaller phrase technology. Any transcriptional errors that result from this process are unintentional.

## 2021-03-22 ENCOUNTER — Encounter: Payer: Self-pay | Admitting: Gastroenterology

## 2021-03-23 ENCOUNTER — Ambulatory Visit: Payer: Medicaid Other | Admitting: Certified Registered"

## 2021-03-23 ENCOUNTER — Encounter: Payer: Self-pay | Admitting: Gastroenterology

## 2021-03-23 ENCOUNTER — Encounter
Admission: RE | Disposition: A | Discharge status: Home / Self Care | Payer: Self-pay | Source: Home / Self Care | Attending: Gastroenterology

## 2021-03-23 ENCOUNTER — Ambulatory Visit
Admission: RE | Admit: 2021-03-23 | Discharge: 2021-03-23 | Disposition: A | Discharge status: Home / Self Care | Payer: Medicaid Other | Attending: Gastroenterology | Admitting: Gastroenterology

## 2021-03-23 DIAGNOSIS — K219 Gastro-esophageal reflux disease without esophagitis: Secondary | ICD-10-CM | POA: Insufficient documentation

## 2021-03-23 DIAGNOSIS — F17210 Nicotine dependence, cigarettes, uncomplicated: Secondary | ICD-10-CM | POA: Insufficient documentation

## 2021-03-23 DIAGNOSIS — D128 Benign neoplasm of rectum: Secondary | ICD-10-CM | POA: Diagnosis not present

## 2021-03-23 DIAGNOSIS — Z1211 Encounter for screening for malignant neoplasm of colon: Secondary | ICD-10-CM | POA: Diagnosis present

## 2021-03-23 DIAGNOSIS — K635 Polyp of colon: Secondary | ICD-10-CM

## 2021-03-23 DIAGNOSIS — K6389 Other specified diseases of intestine: Secondary | ICD-10-CM | POA: Diagnosis not present

## 2021-03-23 DIAGNOSIS — K56609 Unspecified intestinal obstruction, unspecified as to partial versus complete obstruction: Secondary | ICD-10-CM

## 2021-03-23 DIAGNOSIS — D1339 Benign neoplasm of other parts of small intestine: Secondary | ICD-10-CM | POA: Diagnosis not present

## 2021-03-23 HISTORY — PX: COLONOSCOPY WITH PROPOFOL: SHX5780

## 2021-03-23 SURGERY — COLONOSCOPY WITH PROPOFOL
Anesthesia: General

## 2021-03-23 MED ORDER — PROPOFOL 500 MG/50ML IV EMUL
INTRAVENOUS | Status: AC
  Filled 2021-03-23: qty 50

## 2021-03-23 MED ORDER — PROPOFOL 500 MG/50ML IV EMUL
INTRAVENOUS | Status: DC | PRN
  Administered 2021-03-23: 100 ug/kg/min via INTRAVENOUS

## 2021-03-23 MED ORDER — LIDOCAINE HCL (PF) 2 % IJ SOLN
INTRAMUSCULAR | Status: AC
  Filled 2021-03-23: qty 5

## 2021-03-23 MED ORDER — LIDOCAINE HCL (CARDIAC) PF 100 MG/5ML IV SOSY
PREFILLED_SYRINGE | INTRAVENOUS | Status: DC | PRN
  Administered 2021-03-23: 40 mg via INTRAVENOUS

## 2021-03-23 MED ORDER — PROPOFOL 10 MG/ML IV BOLUS
INTRAVENOUS | Status: DC | PRN
  Administered 2021-03-23: 70 mg via INTRAVENOUS

## 2021-03-23 MED ORDER — SODIUM CHLORIDE 0.9 % IV SOLN: INTRAVENOUS | Status: DC

## 2021-03-23 NOTE — Anesthesia Preprocedure Evaluation (Addendum)
Anesthesia Evaluation  Patient identified by MRN, date of birth, ID band Patient awake    Reviewed: Allergy & Precautions, NPO status , Patient's Chart, lab work & pertinent test results  History of Anesthesia Complications Negative for: history of anesthetic complications  Airway Mallampati: II  TM Distance: >3 FB Neck ROM: Full    Dental  (+) Edentulous Upper, Edentulous Lower   Pulmonary Current Smoker and Patient abstained from smoking.,    Pulmonary exam normal        Cardiovascular negative cardio ROS   - Systolic murmurs Tachycardic, regular   Neuro/Psych Bipolar Disorder Schizophrenia Dementia negative neurological ROS     GI/Hepatic GERD  ,(+) Hepatitis -, C  Endo/Other  negative endocrine ROS  Renal/GU negative Renal ROS  negative genitourinary   Musculoskeletal negative musculoskeletal ROS (+)   Abdominal   Peds  Hematology negative hematology ROS (+)   Anesthesia Other Findings EKG 01/30/20 Sinus tachycardia Ventricular trigeminy Consider right atrial enlargement Borderline left axis deviation Low voltage, precordial leads Nonspecific T abnormalities, lateral leads  Reproductive/Obstetrics                            Anesthesia Physical Anesthesia Plan  ASA: 3  Anesthesia Plan: General   Post-op Pain Management:    Induction:   PONV Risk Score and Plan:   Airway Management Planned: Natural Airway  Additional Equipment:   Intra-op Plan:   Post-operative Plan:   Informed Consent: I have reviewed the patients History and Physical, chart, labs and discussed the procedure including the risks, benefits and alternatives for the proposed anesthesia with the patient or authorized representative who has indicated his/her understanding and acceptance.     Consent reviewed with POA (consent obtained from legal guardian)  Plan Discussed with: CRNA  Anesthesia Plan  Comments:         Anesthesia Quick Evaluation

## 2021-03-23 NOTE — OR Nursing (Signed)
RN UNABLE TO LOCATE [place] GUARD[facility]  . LAST GUARDIAN [facility] NO LONGER IN THAT DEPARTEMNT  PER [employer] AT [phone]. LEFT SEVERAL MESSAGE AT [employer] AND TALK TO OFFICE WHO STATED THEY DID NOT KNOW GUARDIA. SPOKE WITH OLD GUARDIAN OFFICE AT [place]. BUT THAT OFFICE UNABLE TO LOCATE PATIENT IN THERE DATA BASE. AWAITING DTR  VERNESIA TO DRIVE IN FROM [place] TO PICKUP PATIENT. IF GUARDIAN DOES NOT CALL BACK AFTER [phone].Marland Kitchen THEN CASE WILL HAVE TO BE RE-SCHEDULED. SPOKE [place] OWNER ERICA AT [place]  AT [phone]

## 2021-03-23 NOTE — Op Note (Addendum)
[facility] Gastroenterology Patient Name: [name] Ball Procedure Date: 03/23/2021 9:57 AM MRN: [id number] Account #: [id number] Date of Birth: 1965-11-18 Admit Type: Outpatient Age: 55 Room: [facility] ENDO ROOM 4 Gender: Female Note Status: Finalized Instrument Name: Jasper Riling [id number] Procedure:             Colonoscopy Indications:           Screening for colorectal malignant neoplasm Providers:             Lucilla Lame MD, MD Referring MD:          No Local Md, MD (Referring MD) Medicines:             Propofol per Anesthesia Complications:         No immediate complications. Procedure:             Pre-Anesthesia Assessment:                        - Prior to the procedure, a History and Physical was                         performed, and patient medications and allergies were                         reviewed. The patient's tolerance of previous                         anesthesia was also reviewed. The risks and benefits                         of the procedure and the sedation options and risks                         were discussed with the patient. All questions were                         answered, and informed consent was obtained. Prior                         Anticoagulants: The patient has taken no previous                         anticoagulant or antiplatelet agents. ASA Grade                         Assessment: III - A patient with severe systemic                         disease. After reviewing the risks and benefits, the                         patient was deemed in satisfactory condition to                         undergo the procedure.                        After obtaining informed consent, the colonoscope was  passed under direct vision. Throughout the procedure,                         the patient's blood pressure, pulse, and oxygen                         saturations were monitored continuously. The                          Colonoscope was introduced through the anus and                         advanced to the the ileocolonic anastomosis. The                         colonoscopy was performed without difficulty. The                         patient tolerated the procedure well. The quality of                         the bowel preparation was good. Findings:      The perianal and digital rectal examinations were normal.      There was evidence of a prior end-to-end colo-colonic anastomosis in the       rectum. The anastomosis was traversed.      The entire examined ileum contained multiple sessile, non-bleeding       polyps. Biopsies were taken with a cold forceps for histology.      A frond-like/villous partially obstructing large mass was found at the       anastomosis. The mass was circumferential. No bleeding was present.       Mucosa was biopsied with a cold forceps for histology.      Many polyps were found in the rectum. Impression:            - End-to-end colo-colonic anastomosis.                        - Multiple polyps in the entire examined ileum.                        - Partially obstructing tumor at the colonic                         anastomosis.                        - Many polyps in the rectum.                        - No specimens collected. Recommendation:        - Discharge patient to home.                        - Resume previous diet.                        - Continue present medications.                        - Await pathology results.                        -  Return to my office in 3 weeks. Lucilla Lame MD, MD 03/23/2021 10:23:30 AM This report has been signed electronically. Number of Addenda: 0 Note Initiated On: 03/23/2021 9:57 AM Total Procedure Duration: 0 hours 14 minutes 2 seconds  Estimated Blood Loss:  Estimated blood loss: none.      [facility]

## 2021-03-23 NOTE — Transfer of Care (Signed)
Immediate Anesthesia Transfer of Care Note  Patient: [name] Ball  Procedure(s) Performed: COLONOSCOPY WITH PROPOFOL  Patient Location: PACU  Anesthesia Type:MAC  Level of Consciousness: awake and drowsy  Airway & Oxygen Therapy: Patient Spontanous Breathing  Post-op Assessment: Report given to RN and Post -op Vital signs reviewed and stable  Post vital signs: stable  Last Vitals:  Vitals Value Taken Time  BP 122/79 03/23/21 1024  Temp    Pulse 85 03/23/21 1024  Resp 20 03/23/21 1024  SpO2 93 % 03/23/21 1024  Vitals shown include unvalidated device data.  Last Pain:  Vitals:   03/23/21 1024  TempSrc:   PainSc: Asleep         Complications: No notable events documented.

## 2021-03-23 NOTE — H&P (Signed)
Lucilla Lame, MD [address] [place], [place] [address] Phone: [phone] Fax : [phone]  Primary Care Physician:  Carlos American, FNP Primary Gastroenterologist:  Dr. Allen Norris  Pre-Procedure History & Physical: HPI:  [name] Ball is a 55 y.o. female is here for a screening colonoscopy.   Past Medical History:  Diagnosis Date   Bipolar disorder ([place])    GERD (gastroesophageal reflux disease)    Hepatitis C virus infection without hepatic coma     Past Surgical History:  Procedure Laterality Date   abdominal cyst removed     CESAREAN SECTION     COLON SURGERY      Prior to Admission medications   Medication Sig Start Date End Date Taking? Authorizing Provider  benztropine (COGENTIN) 1 MG tablet Take 1 mg by mouth 2 (two) times daily. 01/30/21  Yes [provider]  cloZAPine (CLOZARIL) 100 MG tablet Take 1 tablet (100 mg) by mouth in the morning & 2 tablets (200 mg) at bedtime: For mood control Patient not taking: Reported on 02/07/2021 06/20/17   Lindell Spar I, NP  diltiazem (CARDIZEM CD) 120 MG 24 hr capsule Take 1 capsule (120 mg total) by mouth daily. For high blood pressure Patient not taking: Reported on 02/07/2021 06/21/17   Lindell Spar I, NP  divalproex (DEPAKOTE) 500 MG DR tablet Take 1 tablet (500 mg total) by mouth 2 (two) times daily. For mood stabilization 06/20/17   Lindell Spar I, NP  donepezil (ARICEPT) 5 MG tablet Take 5 mg by mouth at bedtime.    [provider]  haloperidol (HALDOL) 5 MG tablet Take 1 tablet (5 mg total) by mouth 3 (three) times daily. For mood control 06/20/17   Lindell Spar I, NP  haloperidol decanoate (HALDOL DECANOATE) 100 MG/ML injection Inject 1.5 mLs (150 mg total) into the muscle every 30 (thirty) days. (Due on 06-30-17): For mood control 06/30/17   Nwoko, Herbert Pun I, NP  LORazepam (ATIVAN) 1 MG tablet Take 1 tablet (1 mg total) by mouth every 6 (six) hours as needed for anxiety. Patient not taking:  Reported on 02/07/2021 06/20/17   Lindell Spar I, NP  meclizine (ANTIVERT) 25 MG tablet Take 25 mg by mouth 2 (two) times daily.    [provider]  nicotine (NICODERM CQ - DOSED IN MG/24 HOURS) 14 mg/24hr patch Place 1 patch (14 mg total) onto the skin daily. For smoking cessation Patient not taking: Reported on 02/07/2021 06/21/17   Lindell Spar I, NP  pantoprazole (PROTONIX) 20 MG tablet Take 20 mg by mouth daily. 01/30/21   [provider]  PROAIR HFA 108 (90 Base) MCG/ACT inhaler Inhale into the lungs. Patient not taking: Reported on 02/07/2021 01/27/21   [provider]  SYMBICORT 160-4.5 MCG/ACT inhaler Inhale into the lungs. Patient not taking: Reported on 02/07/2021 01/27/21   [provider]  Vitamin D, Ergocalciferol, (DRISDOL) 1.25 MG (50000 UNIT) CAPS capsule Take by mouth. Patient not taking: Reported on 02/07/2021 01/30/21   [provider]    Allergies as of 02/08/2021 - Review Complete 02/07/2021  Allergen Reaction Noted   Trazodone and nefazodone  03/15/2016    History reviewed. No pertinent family history.  Social History   Socioeconomic History   Marital status: Unknown    Spouse name: Not on file   Number of children: Not on file   Years of education: Not on file   Highest education level: Not on file  Occupational History   Not on  file  Tobacco Use   Smoking status: Every Day    Packs/day: 1.00    Types: Cigarettes   Smokeless tobacco: Never  Substance and Sexual Activity   Alcohol use: No   Drug use: No   Sexual activity: Not on file  Other Topics Concern   Not on file  Social History Narrative   Not on file   Social Determinants of Health   Financial Resource Strain: Not on file  Food Insecurity: Not on file  Transportation Needs: Not on file  Physical Activity: Not on file  Stress: Not on file  Social Connections: Not on file  Intimate Partner Violence: Not on file    Review of Systems: See HPI, otherwise  negative ROS  Physical Exam: There were no vitals taken for this visit. General:   Alert,  pleasant and cooperative in NAD Head:  Normocephalic and atraumatic. Neck:  Supple; no masses or thyromegaly. Lungs:  Clear throughout to auscultation.    Heart:  Regular rate and rhythm. Abdomen:  Soft, nontender and nondistended. Normal bowel sounds, without guarding, and without rebound.   Neurologic:  Alert and  oriented x4;  grossly normal neurologically.  Impression/Plan: [name] Ball is now here to undergo a screening colonoscopy.  Risks, benefits, and alternatives regarding colonoscopy have been reviewed with the patient.  Questions have been answered.  All parties agreeable.

## 2021-03-23 NOTE — Anesthesia Postprocedure Evaluation (Signed)
Anesthesia Post Note  Patient: Therapist, music  Procedure(s) Performed: COLONOSCOPY WITH PROPOFOL  Patient location during evaluation: PACU Anesthesia Type: General Level of consciousness: awake and alert Pain management: pain level controlled Vital Signs Assessment: post-procedure vital signs reviewed and stable Respiratory status: spontaneous breathing, nonlabored ventilation, respiratory function stable and patient connected to nasal cannula oxygen Cardiovascular status: blood pressure returned to baseline and stable Postop Assessment: no apparent nausea or vomiting Anesthetic complications: no   No notable events documented.   Last Vitals:  Vitals:   03/23/21 1044 03/23/21 1054  BP: 125/74 103/87  Pulse: 86 86  Resp: 20 19  Temp: (!) 35.3 C   SpO2: 99% 100%    Last Pain:  Vitals:   03/23/21 1054  TempSrc:   PainSc: 0-No pain                 Ferdinand

## 2021-03-24 ENCOUNTER — Encounter: Payer: Self-pay | Admitting: Gastroenterology

## 2021-03-24 LAB — SURGICAL PATHOLOGY

## 2021-03-30 ENCOUNTER — Telehealth: Payer: Self-pay

## 2021-03-30 NOTE — Telephone Encounter (Signed)
Left a detailed message with DSS, Ms. Nicki Reaper to return my call to schedule a follow up appt with Dr. Allen Norris to discuss results of recent colonoscopy.

## 2021-05-01 ENCOUNTER — Ambulatory Visit (INDEPENDENT_AMBULATORY_CARE_PROVIDER_SITE_OTHER): Payer: Medicaid Other | Admitting: Gastroenterology

## 2021-05-01 ENCOUNTER — Other Ambulatory Visit: Payer: Self-pay

## 2021-05-01 ENCOUNTER — Encounter: Payer: Self-pay | Admitting: Gastroenterology

## 2021-05-01 VITALS — BP 111/65 | HR 102 | Temp 97.4°F | Ht 64.0 in | Wt 190.4 lb

## 2021-05-01 DIAGNOSIS — K56609 Unspecified intestinal obstruction, unspecified as to partial versus complete obstruction: Secondary | ICD-10-CM | POA: Diagnosis not present

## 2021-05-01 DIAGNOSIS — D126 Benign neoplasm of colon, unspecified: Secondary | ICD-10-CM

## 2021-05-01 NOTE — Progress Notes (Signed)
Primary Care Physician: Carlos American, FNP  Primary Gastroenterologist:  Dr. Lucilla Lame  Chief Complaint  Patient presents with   Follow up procedure results    HPI: [name] Ball is a 55 y.o. female here who is here for follow-up after having a colonoscopy.  The patient had multiple small bowel polyps and a mass at the anastomosis.  The polyps and the mass all were shown to have adenomatous changes without any dysplasia. The patient comes in with her sister and her daughter.  The family reports that the patient had most of her colon taken out due to numerous polyps and her sister states that she also carries a gene for polyps.  Past Medical History:  Diagnosis Date   Bipolar disorder ([place])    GERD (gastroesophageal reflux disease)    Hepatitis C virus infection without hepatic coma     Current Outpatient Medications  Medication Sig Dispense Refill   benztropine (COGENTIN) 1 MG tablet Take 1 mg by mouth 2 (two) times daily.     divalproex (DEPAKOTE) 500 MG DR tablet Take 1 tablet (500 mg total) by mouth 2 (two) times daily. For mood stabilization (Patient taking differently: Take 1,000 mg by mouth at bedtime. For mood stabilization) 60 tablet 0   donepezil (ARICEPT) 5 MG tablet Take 5 mg by mouth at bedtime.     haloperidol (HALDOL) 5 MG tablet Take 1 tablet (5 mg total) by mouth 3 (three) times daily. For mood control 90 tablet 0   haloperidol decanoate (HALDOL DECANOATE) 100 MG/ML injection Inject 1.5 mLs (150 mg total) into the muscle every 30 (thirty) days. (Due on 06-30-17): For mood control 1 mL 0   meclizine (ANTIVERT) 25 MG tablet Take 25 mg by mouth 2 (two) times daily.     pantoprazole (PROTONIX) 20 MG tablet Take 20 mg by mouth daily.     cloZAPine (CLOZARIL) 100 MG tablet Take 1 tablet (100 mg) by mouth in the morning & 2 tablets (200 mg) at bedtime: For mood control (Patient not taking: No sig reported) 84 tablet 0   diltiazem (CARDIZEM CD) 120 MG 24 hr  capsule Take 1 capsule (120 mg total) by mouth daily. For high blood pressure (Patient not taking: No sig reported) 30 capsule 0   LORazepam (ATIVAN) 1 MG tablet Take 1 tablet (1 mg total) by mouth every 6 (six) hours as needed for anxiety. (Patient not taking: No sig reported) 30 tablet 0   nicotine (NICODERM CQ - DOSED IN MG/24 HOURS) 14 mg/24hr patch Place 1 patch (14 mg total) onto the skin daily. For smoking cessation (Patient not taking: No sig reported) 28 patch 0   PROAIR HFA 108 (90 Base) MCG/ACT inhaler Inhale into the lungs. (Patient not taking: No sig reported)     SYMBICORT 160-4.5 MCG/ACT inhaler Inhale into the lungs. (Patient not taking: No sig reported)     Vitamin D, Ergocalciferol, (DRISDOL) 1.25 MG (50000 UNIT) CAPS capsule Take by mouth. (Patient not taking: No sig reported)     No current facility-administered medications for this visit.    Allergies as of 05/01/2021 - Review Complete 05/01/2021  Allergen Reaction Noted   Trazodone and nefazodone  03/15/2016    ROS:  General: Negative for anorexia, weight loss, fever, chills, fatigue, weakness. ENT: Negative for hoarseness, difficulty swallowing , nasal congestion. CV: Negative for chest pain, angina, palpitations, dyspnea on exertion, peripheral edema.  Respiratory: Negative for dyspnea at rest, dyspnea on exertion, cough, sputum, wheezing.  GI: See history of present illness. GU:  Negative for dysuria, hematuria, urinary incontinence, urinary frequency, nocturnal urination.  Endo: Negative for unusual weight change.    Physical Examination:   BP 111/65 (BP Location: Right Arm, Patient Position: Sitting, Cuff Size: Large)   Pulse (!) 102   Temp (!) 97.4 F (36.3 C) (Temporal)   Ht 5\' 4"  (1.626 m)   Wt 190 lb 6.4 oz (86.4 kg)   BMI 32.68 kg/m   General: Well-nourished, well-developed in no acute distress.  Eyes: No icterus. Conjunctivae pink. Neuro: Alert and oriented x 3.  Grossly intact. Skin: Warm and  dry, no jaundice.   Psych: Alert and cooperative, normal mood and affect.  Labs:    Imaging Studies: No results found.  Assessment and Plan:   [name] Ball is a 55 y.o. y/o female Who comes in today for follow-up after having a colonoscopy.  The patient had multiple polyps in the rectum and at the anastomosis with a large mass at the anastomosis.  Patient also had multiple polyps in the small bowel that were biopsied.  All of these came back as adenomas.  The patient has been told the results.  The patient will be set up for a Upper GI with small bowel follow-through and she'll also be sent to [facility] for referral to a colorectal surgeon. The patient has been told that this will not address the multiple adenomatous in the small bowel but should have this large lesion removed before she has an obstruction.  She was also told that she will likely end up with a ileostomy.  The patient has been explained the plan and agrees with it.     Lucilla Lame, MD. Marval Regal    Note: This dictation was prepared with Dragon dictation along with smaller phrase technology. Any transcriptional errors that result from this process are unintentional.

## 2021-05-09 ENCOUNTER — Ambulatory Visit: Payer: Medicaid Other

## 2021-05-22 ENCOUNTER — Ambulatory Visit
Admission: RE | Admit: 2021-05-22 | Discharge: 2021-05-22 | Disposition: A | Discharge status: Home / Self Care | Payer: Medicaid Other | Source: Ambulatory Visit | Attending: Gastroenterology | Admitting: Gastroenterology

## 2021-05-22 DIAGNOSIS — K56609 Unspecified intestinal obstruction, unspecified as to partial versus complete obstruction: Secondary | ICD-10-CM | POA: Diagnosis present

## 2021-05-23 ENCOUNTER — Other Ambulatory Visit: Payer: Self-pay

## 2021-06-01 ENCOUNTER — Telehealth: Payer: Self-pay

## 2021-06-01 NOTE — Telephone Encounter (Signed)
-----   Message from Lucilla Lame, MD sent at 05/23/2021  8:14 AM EST ----- Please let the patient know that the small bowel follow-through did not show any sign of obstruction with normal transit time of the contrast to the colon.

## 2021-06-01 NOTE — Telephone Encounter (Signed)
Pt's guardian has been notified of small bowel follow through results.

## 2021-06-13 ENCOUNTER — Telehealth: Payer: Self-pay | Admitting: Genetic Counselor

## 2021-06-13 NOTE — Telephone Encounter (Signed)
Scheduled appt per 12/12 referral. Spoke to [facility] who is aware of appt date and time.

## 2021-07-12 ENCOUNTER — Inpatient Hospital Stay: Payer: Medicaid Other | Attending: Genetic Counselor | Admitting: Genetic Counselor

## 2021-07-12 ENCOUNTER — Encounter: Payer: Self-pay | Admitting: Genetic Counselor

## 2021-07-12 ENCOUNTER — Other Ambulatory Visit: Payer: Self-pay | Admitting: Genetic Counselor

## 2021-07-12 ENCOUNTER — Inpatient Hospital Stay: Payer: Medicaid Other

## 2021-07-12 ENCOUNTER — Other Ambulatory Visit: Payer: Self-pay

## 2021-07-12 DIAGNOSIS — Z800 Family history of malignant neoplasm of digestive organs: Secondary | ICD-10-CM | POA: Diagnosis not present

## 2021-07-12 DIAGNOSIS — D126 Benign neoplasm of colon, unspecified: Secondary | ICD-10-CM

## 2021-07-12 DIAGNOSIS — C189 Malignant neoplasm of colon, unspecified: Secondary | ICD-10-CM

## 2021-07-12 LAB — GENETIC SCREENING ORDER

## 2021-07-12 NOTE — Progress Notes (Signed)
REFERRING PROVIDER: Ileana Roup, MD [address],  [place] [address]  PRIMARY PROVIDER:  Carlos American, FNP  PRIMARY REASON FOR VISIT:  1. Family history of colon cancer   2. Adenomatous polyp of colon, unspecified part of colon      HISTORY OF PRESENT ILLNESS:   [name] Ball, a 56 y.o. female, was seen for a [facility] cancer genetics consultation at the request of Dr. Dema Severin due to a personal history of polyposis and family history of colon cancer.  [name] Ball presents to clinic today to discuss the possibility of a hereditary predisposition to cancer, genetic testing, and to further clarify her future cancer risks, as well as potential cancer risks for family members.   [name] Ball is a 56 y.o. female with no personal history of cancer.  She is reported to had polyposis in her 20's that resulted in her having a colectomy.  She currently has several polyps. [name] Ball had help with her family history by her daughter.  CANCER HISTORY:  Oncology History   No history exists.     Past Medical History:  Diagnosis Date   Bipolar disorder ([place])    Family history of colon cancer    GERD (gastroesophageal reflux disease)    Hepatitis C virus infection without hepatic coma     Past Surgical History:  Procedure Laterality Date   abdominal cyst removed     CESAREAN SECTION     COLON SURGERY     COLONOSCOPY WITH PROPOFOL N/A 03/23/2021   Procedure: COLONOSCOPY WITH PROPOFOL;  Surgeon: Lucilla Lame, MD;  Location: [facility] ENDOSCOPY;  Service: Endoscopy;  Laterality: N/A;    Social History   Socioeconomic History   Marital status: Married    Spouse name: Not on file   Number of children: Not on file   Years of education: Not on file   Highest education level: Not on file  Occupational History   Not on file  Tobacco Use   Smoking status: Every Day    Packs/day: 1.00    Types: Cigarettes   Smokeless tobacco: Never  Vaping Use   Vaping Use: Never used   Substance and Sexual Activity   Alcohol use: No   Drug use: No   Sexual activity: Not on file  Other Topics Concern   Not on file  Social History Narrative   Not on file   Social Determinants of Health   Financial Resource Strain: Not on file  Food Insecurity: Not on file  Transportation Needs: Not on file  Physical Activity: Not on file  Stress: Not on file  Social Connections: Not on file     FAMILY HISTORY:  We obtained a detailed, 4-generation family history.  Significant diagnoses are listed below: Family History  Problem Relation Age of Onset   Colon cancer Mother        dx in her 66s   Colon polyps Sister    Colon polyps Sister    Colon cancer Maternal Aunt        several   Colon cancer Maternal Uncle        several   Colon cancer Cousin        mat first cousin    The patient has a daughter and two sons who are cancer free.  She has two full sisters and 1-2 paternal half brothers.  Her sisters have a history of colon polyps.  Her parents are both deceased.  The patient's father is deceased.  He had siblings that were not reported to have cancer.  There is no other reported family history of cancer.  The patient's mother was diagnosed with colon cancer in her 71's and died in his 19's.  She had several siblings who had colon cancer and at least one niece/nephew had colon cancer.  There was no known cancer in the maternal grandparents.  [name] Ball is unaware of previous family history of genetic testing for hereditary cancer risks. Patient's maternal ancestors are of African American descent, and paternal ancestors are of African American descent. There is no reported Ashkenazi Jewish ancestry. There is no known consanguinity.  GENETIC COUNSELING ASSESSMENT: [name] Ball is a 56 y.o. female with a personal history of colon polyps and family history of colon cancer which is somewhat suggestive of a hereditary polyposis syndrome and predisposition to cancer given her  colectomy due to polyps in her 98's and her family history of cancer. We, therefore, discussed and recommended the following at today's visit.   DISCUSSION: We discussed that, in general, most cancer is not inherited in families, but instead is sporadic or familial. Sporadic cancers occur by chance and typically happen at older ages (>50 years) as this type of cancer is caused by genetic changes acquired during an individuals lifetime. Some families have more cancers than would be expected by chance; however, the ages or types of cancer are not consistent with a known genetic mutation or known genetic mutations have been ruled out. This type of familial cancer is thought to be due to a combination of multiple genetic, environmental, hormonal, and lifestyle factors. While this combination of factors likely increases the risk of cancer, the exact source of this risk is not currently identifiable or testable.  We discussed many people will have polyps, but when an individual has enough polyps to necessitate a colectomy, there is an increased risk for that to be due to a hereditary polyposis syndrome.  Additionally, it is estimated that 5 - 7% of colon cancer is hereditary, with most polyposis cases associated with Familial Polyposis syndrome, or FAP.  There are genes that can be associated with hereditary polyposis cancer syndromes.  These include APC, MUTYH, BMPR1A and others.  We discussed that testing is beneficial for several reasons including knowing how to follow individuals after completing their treatment, identifying whether potential treatment options would be beneficial, and understand if other family members could be at risk for cancer and allow them to undergo genetic testing.   We reviewed the characteristics, features and inheritance patterns of hereditary cancer syndromes. We also discussed genetic testing, including the appropriate family members to test, the process of testing, insurance  coverage and turn-around-time for results. We discussed the implications of a negative, positive, carrier and/or variant of uncertain significant result. We recommended [name] Ball pursue genetic testing for the CancerNext-Expanded+RNAinsight gene panel.   The CancerNext-Expanded gene panel offered by [facility] and includes sequencing and rearrangement analysis for the following 77 genes: AIP, ALK, APC*, ATM*, AXIN2, BAP1, BARD1, BLM, BMPR1A, BRCA1*, BRCA2*, BRIP1*, CDC73, CDH1*, CDK4, CDKN1B, CDKN2A, CHEK2*, CTNNA1, DICER1, FANCC, FH, FLCN, GALNT12, KIF1B, LZTR1, MAX, MEN1, MET, MLH1*, MSH2*, MSH3, MSH6*, MUTYH*, NBN, NF1*, NF2, NTHL1, PALB2*, PHOX2B, PMS2*, POT1, PRKAR1A, PTCH1, PTEN*, RAD51C*, RAD51D*, RB1, RECQL, RET, SDHA, SDHAF2, SDHB, SDHC, SDHD, SMAD4, SMARCA4, SMARCB1, SMARCE1, STK11, SUFU, TMEM127, TP53*, TSC1, TSC2, VHL and XRCC2 (sequencing and deletion/duplication); EGFR, EGLN1, HOXB13, KIT, MITF, PDGFRA, POLD1, and POLE (sequencing only); EPCAM and GREM1 (deletion/duplication only). DNA and RNA  analyses performed for * genes.  Based on [name] Ball's personal history of polyposis and family history of cancer, she meets medical criteria for genetic testing. Despite that she meets criteria, she may still have an out of pocket cost. We discussed that if her out of pocket cost for testing is over $100, the laboratory will call and confirm whether she wants to proceed with testing.  If the out of pocket cost of testing is less than $100 she will be billed by the genetic testing laboratory.   PLAN: After considering the risks, benefits, and limitations, [name] Ball provided informed consent to pursue genetic testing and the blood sample was sent to [facility] for analysis of the CancerNext-Expanded+RNAinsight panel. Results should be available within approximately 2-3 weeks' time, at which point they will be disclosed by telephone to [name] Ball, as will any additional  recommendations warranted by these results. [name] Ball will receive a summary of her genetic counseling visit and a copy of her results once available. This information will also be available in Epic.   Lastly, we encouraged [name] Ball to remain in contact with cancer genetics annually so that we can continuously update the family history and inform her of any changes in cancer genetics and testing that may be of benefit for this family.   [name] Ball questions were answered to her satisfaction today. Our contact information was provided should additional questions or concerns arise. Thank you for the referral and allowing [place] to share in the care of your patient.   Coda Filler P. Florene Glen, [place], [facility] Licensed, Insurance risk surveyor [email or web address] phone: [phone]  The patient was seen for a total of 47 minutes in face-to-face genetic counseling.  The patient brought a worker at her facility named [name] Ball and her daughter, [name] Ball, was on the phone. This patient was discussed with Drs. Magrinat, Lindi Adie and/or Burr Medico who agrees with the above.    _______________________________________________________________________ For Office Staff:  Number of people involved in session: 2 Was an Intern/ student involved with case: no

## 2021-08-02 ENCOUNTER — Encounter: Payer: Self-pay | Admitting: Genetic Counselor

## 2021-08-02 ENCOUNTER — Telehealth: Payer: Self-pay | Admitting: Genetic Counselor

## 2021-08-02 DIAGNOSIS — Z1379 Encounter for other screening for genetic and chromosomal anomalies: Secondary | ICD-10-CM | POA: Insufficient documentation

## 2021-08-02 NOTE — Telephone Encounter (Signed)
LM on VM that results are back on her mother and please call back.

## 2021-08-03 NOTE — Telephone Encounter (Signed)
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  This is consistent with having a colectomy in her 20's for polyps and the family history of colon cancer.  Briefly discussed the up to 100% risk for colon cancer in individuals affected with the classic (not attenuated) form of FAP if intervention is not performed.  Caryn Section will discuss with her brothers and find a time when they all can be on a virtual visit for a results discussion.

## 2021-08-17 ENCOUNTER — Inpatient Hospital Stay: Payer: Medicaid Other | Attending: Genetic Counselor | Admitting: Genetic Counselor

## 2021-08-17 ENCOUNTER — Other Ambulatory Visit: Payer: Self-pay

## 2021-08-17 DIAGNOSIS — D126 Benign neoplasm of colon, unspecified: Secondary | ICD-10-CM | POA: Diagnosis not present

## 2021-08-17 DIAGNOSIS — Z1379 Encounter for other screening for genetic and chromosomal anomalies: Secondary | ICD-10-CM | POA: Diagnosis not present

## 2021-08-17 NOTE — Progress Notes (Signed)
GENETIC TEST RESULTS   Patient Name: [name] Ball Patient Age: 56 y.o. Encounter Date: 08/17/2021  Referring Provider: Nadeen Landau, MD    [name] Ball was seen in the [facility] clinic on August 17, 2021 due to a personal history of polyposis and family history of colon cancer and concern regarding a hereditary predisposition to cancer in the family. Please refer to the prior Genetics clinic note for more information regarding [name] Ball medical and family histories and our assessment at the time.   FAMILY HISTORY:  We obtained a detailed, 4-generation family history.  Significant diagnoses are listed below: Family History  Problem Relation Age of Onset   Colon cancer Mother        dx in her 20s   Colon polyps Sister    Colon polyps Sister    Colon cancer Maternal Aunt        several   Colon cancer Maternal Uncle        several   Colon cancer Cousin        mat first cousin     The patient has a daughter and two sons who are cancer free.  She has two full sisters and 1-2 paternal half brothers.  Her sisters have a history of colon polyps.  Her parents are both deceased.   The patient's father is deceased.  He had siblings that were not reported to have cancer.  There is no other reported family history of cancer.   The patient's mother was diagnosed with colon cancer in her 30's and died in his 19's.  She had several siblings who had colon cancer and at least one niece/nephew had colon cancer.  There was no known cancer in the maternal grandparents.   [name] Ball is unaware of previous family history of genetic testing for hereditary cancer risks. Patient's maternal ancestors are of African American descent, and paternal ancestors are of African American descent. There is no reported Ashkenazi Jewish ancestry. There is no known consanguinity.  GENETIC TESTING:  At the time of [name] Ball's visit, we recommended she pursue genetic testing. 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 There were no deleterious mutations in  AIP, ALK, ATM*, AXIN2, BAP1, BARD1, BLM, BMPR1A, BRCA1*, BRCA2*, BRIP1*, CDC73, CDH1*, CDK4, CDKN1B, CDKN2A, CHEK2*, CTNNA1, DICER1, FANCC, FH, FLCN, GALNT12, KIF1B, LZTR1, MAX, MEN1, MET, MLH1*, MSH2*, MSH3, MSH6*, MUTYH*, NBN, NF1*, NF2, NTHL1, PALB2*, PHOX2B, PMS2*, POT1, PRKAR1A, PTCH1, PTEN*, RAD51C*, RAD51D*, RB1, RECQL, RET, SDHA, SDHAF2, SDHB, SDHC, SDHD, SMAD4, SMARCA4, SMARCB1, SMARCE1, STK11, SUFU, TMEM127, TP53*, TSC1, TSC2, VHL and XRCC2 (sequencing and deletion/duplication); EGFR, EGLN1, HOXB13, KIT, MITF, PDGFRA, POLD1, and POLE (sequencing only); EPCAM and GREM1 (deletion/duplication only). DNA and RNA analyses performed for * genes. .     Genetic testing did identify a variant of uncertain significance (VUS) was identified in the MAX gene called c.171+4A>G.  At this time, it is unknown if this variant is associated with increased cancer risk or if this is a normal finding, but most variants such as this get reclassified to being inconsequential. It should not be used to make medical management decisions. With time, we suspect the lab will determine the significance of this variant, if any. If we do learn more about it, we will try to contact [name] Ball to discuss it further. However, it is important to stay in touch with  [place] periodically and keep the address and phone number up to date.  Clinical condition Familial adenomatous polyposis (FAP) is a genetic condition that is characterized by the presence of greater than 100 adenomatous colon polyps. On average, individuals with FAP develop polyps at 57 years of age, with 95% of individuals developing polyps by age 67. The risk of developing colon cancer is from 43% to theoretically 100%, if colectomy is not  performed (PMID: 26378588, 50277412, 8786767). The average age of colon cancer diagnosis is 53 years, with 93% of untreated individuals diagnosed by age 41 (PMID: 20947096).  FAP may increase the risk of developing other types of non-colonic cancers: Up to 10% risk of cancer of the duodenum (PMID: 28366294, 76546503) 1-12% risk of papillary thyroid cancer (PMID: 54656812) Up to 7% risk of stomach cancer (PMID: 75170017) <1% risk of brain/central nervous system malignancies (PMID: 49449675) Up to 2.5% risk of hepatoblastoma in children (PMID: 9163846, 6599357) 1-2% risk of pancreatic cancer (PMID: 01779390, 30092330, 0762263)  Extra-colonic findings can include extra or missing teeth, desmoid tumors, osteomas, epidermoid cysts, fibromas, and congenital hyperplasia of the retinal pigment epithelium (CHRPE), which is a specific finding that requires a specialized eye examination to identify (PMID: 33545625).  In addition to classic FAP, three subtypes of FAP have been described: Gardner syndrome, Turcot syndrome, and attenuated FAP (AFAP). Gardner syndrome has the same disease progression and risk of colon cancer as classic FAP but is also associated with the development of other extracolonic findings including desmoid tumors, sebaceous cysts, osteomas, supernumerary teeth, and cancer of the duodenum, exocrine pancreas, thyroid (papillary adenocarcinoma), hepatoblastomas, and central nervous system (medulloblastomas). Gardner syndrome was once thought to be a distinct clinical entity; however, like FAP, it is now known to arise from pathogenic variants in APC. Furthermore, with sufficient investigation, subtle extraintestinal manifestations can be found in almost all individuals with FAP (PMID: 63893734).  AFAP has a later age of onset than classic FAP (approximately 57 years of age), presents with fewer adenomatous polyps (<100) that are primarily proximal (right-sided), and has an overall lower lifetime  risk of developing cancer of approximately 70% (PMID: 28768115, 72620355, 9741638, 45364680). It can be difficult to determine which form, attenuated or classic, is running in the family.  Based on the age of [name] Ball's colectomy, it appears that this could lean towards the classic form.  However, the location of the mutation (the end of the gene) and ages of onset of cancer (in the 24's), this leans towards attenuated form.  We discussed that we should follow the family as if this is the classic form, and if we learn that people are developing polyps later, and that screening may be able to be delayed, then we can follow the family as if it is attenuated.  Inheritance FAP has autosomal dominant inheritance. This means that an individual with a pathogenic variant has a 50% chance of passing the condition on to their offspring. It is now possible to identify at-risk relatives who can pursue testing for this specific familial variant. While many cases are inherited from a parent, some occur spontaneously (PMID: [phone]). This means that an individual with a pathogenic variant has parents who do not have it. However, that individual now has a 50% risk of passing it on to future offspring.  Management Medical management and surveillance protocols have been developed by the [place] (NCCN) for individuals with FAP and AFAP ([place], Clinical practice guidelines in oncology. Genetic/Familial High  Risk Assessment: Colorectal). These recommendations apply to individuals diagnosed with FAP and AFAP based on clinical findings or genetic test results, as well as individuals at an increased risk based on family history who have not had negative genetic testing:  FAP  Colon: Annual sigmoidoscopy/colonoscopy beginning at 12-55 years of age. A colectomy or proctocolectomy is recommended after numerous polyps are detected, due to their high malignant potential.   Total proctocolectomy with ileal pouch-anal anastomosis (IPAA) is generally the recommended surgical approach for individuals with FAP. If colectomy with ileorectal anastomosis (IRA) is performed, endoscopic evaluation of the remaining rectum is recommended every 6-12 months depending on polyp burden. If total proctocolectomy with IPAA is performed, endoscopic evaluation of the ileal pouch or ileostomy is recommended every 1-3 years depending on polyp burden.  If large, flat polyps with villous histology or polyps with high-grade dysplasia are identified, then surveillance frequency should be every 6 months. Chemoprevention (e.g., sulindac) can aid in management of the remaining rectum; however, there are no medications currently approved by the FDA for this indication.  There are data to suggest that sulindac showed the most significant polyp regression, but it is unclear if the decrease in polyp burden equates to reduction in colorectal cancer risk. Extracolonic: Upper endoscopy with side-viewing examination beginning at age 67-25 years. Consider upper endoscopy at an earlier age if colectomy is performed prior to age 41 years. Frequency of upper endoscopy surveillance is dependent on polyp burden. It is important to note that fundic gland polyps are common in individuals with FAP and while focal low-grade dysplasia can be identified, it is typically non-progressive. Non-fundic gland polyps should be managed endoscopically if possible. Annual thyroid examination beginning in the late teenage years.  Annual thyroid ultrasound may be considered, but data are lacking to support this recommendation. Annual physical examination for CNS cancers. Annual abdominal palpation for desmoids. If family history of symptomatic desmoids: consider abdominal MRI or CT  1-3 years post-colectomy, then every 5-10 years. For small bowel polyps and cancer, consider adding small bowel visualization to MRI or CT for desmoids  especially if duodenal polyposis is advanced. Consider liver palpation, abdominal ultrasound, and measurement of AFP every 3-6 months during the first 5 years of life. AFAP Colon: Colonoscopy beginning in the late teens, then every 2-3 years. If a small polyp burden is found, repeat colonoscopy with polypectomy every 1-2 years. If polyp burden is too great, consider colectomy with ileorectal anastomosis (IRA) or proctocolectomy with ileal pouch-anal anastomosis (IPAA) if rectal polyposis is not manageable with polypectomy. Following colectomy with IRA, endoscopic evaluation of the remaining rectum is recommended every 6-12 months depending on polyp burden.  Chemoprevention (e.g., sulindac) can aid in management of the remaining rectum; however, there are no medications currently approved by the FDA for this indication.  There are data to suggest that sulindac showed the most significant polyp regression, but it is unclear if the decrease in polyp burden equates to reduction in colorectal cancer risk. Extracolonic: Annual physical examination. Annual thyroid examination. Upper endoscopy with side-viewing examination beginning at age 48-25 years. Consider upper endoscopy at an earlier age if colectomy is performed prior to age 65 years. Frequency of upper endoscopy surveillance is dependent on polyp burden.  An individuals cancer risk and medical management are not determined by genetic test results alone. Overall cancer risk assessment incorporates additional factors, including personal medical history, family history, and any available genetic information that may result in a personalized plan for cancer prevention and surveillance.  FAMILY MEMBERS: It is advantageous to know if an individual has a pathogenic variant in APC as medical management recommendations can be implemented. At-risk relatives can be identified, allowing pursuit of a diagnostic evaluation. In addition, the available information  regarding hereditary cancer susceptibility genes is constantly evolving and more clinically relevant APC data are likely to become available in the near future. Awareness of this cancer predisposition encourages patients and their providers to inform at-risk family members, to diligently follow standard screening protocols, and to be vigilant in maintaining close and regular contact with their local genetics clinic in anticipation of new information.  It is important that all of Ms. Bonadonna's relatives (both men and women) know of the presence of this gene mutation. Site-specific genetic testing can sort out who in the family is at risk and who is not. It is advantageous to know if a APC pathogenic variant is present as medical management recommendations can be implemented. At-risk relatives can be identified, allowing pursuit of a diagnostic evaluation. In addition, the available information regarding hereditary cancer susceptibility genes is constantly evolving and more clinically relevant APC data is likely to become available in the near future. Awareness of this cancer predisposition allows patients and their providers to be vigilant in maintaining close and regular contact with their local genetics clinic in anticipation of new information, inform at-risk family members, and diligently follow condition-specific screening protocols.  [name] Ball children and siblings have a 50% chance to have inherited this mutation. We recommend they have genetic testing for this same mutation, as identifying the presence of this mutation would allow them to also take advantage of risk-reducing measures. [name] Ball daughter is with her today and is pursuing genetic testing.    SUPPORT AND RESOURCES: We provided [name] Ball with fact sheets and a booklet about APC.  If she would like to speak with other individuals from high-risk families, she may contact the [place] support group at  www.YourCloudFront.fr. To locate genetic counselors in other cities, visit the website of the [employer] (ArtistMovie.se) and Secretary/administrator for a Social worker by zip code.  We encouraged [name] Ball to remain in contact with [place] on an annual basis so we can update her personal and family histories, and let her know of advances in cancer genetics that may benefit the family. Our contact number was provided. [name] Ball questions were answered to her satisfaction today, and she knows she is welcome to call anytime with additional questions.   Aarilyn Dye P. Florene Glen, [place], [facility] Licensed, Insurance risk surveyor [email or web address] phone: [phone]  The patient was seen for a total of 30 minutes in face-to-face genetic counseling.

## 2021-09-05 ENCOUNTER — Ambulatory Visit: Payer: Medicaid Other | Admitting: Podiatry

## 2023-11-17 IMAGING — CR DG SMALL BOWEL
6 series · 6 of 6 positions shown · non-contrast
Comparison: None.

CLINICAL DATA: Small bowel obstruction

EXAM:
SMALL BOWEL SERIES
TECHNIQUE: Following ingestion of thin barium, serial small bowel images were
obtained including spot views of the terminal ileum.
FLUOROSCOPY TIME:  Fluoroscopy Time:  None.
Number of Acquired Spot Images: 6

[t abdomen supine (1 of 6)]
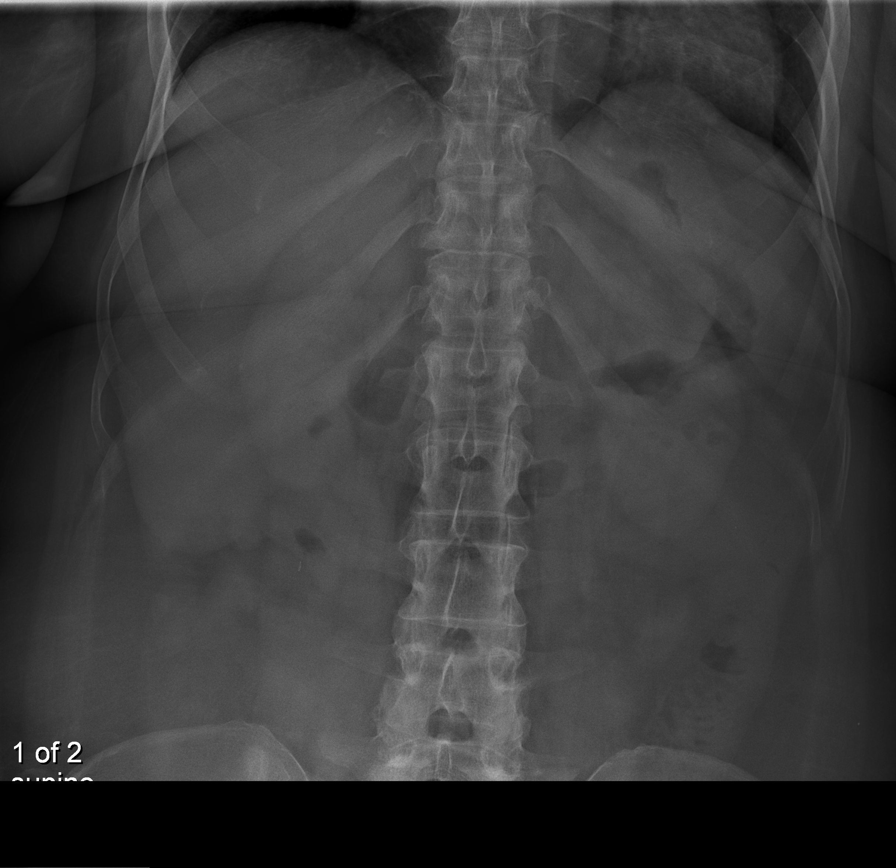

[t abdomen supine (2 of 6)]
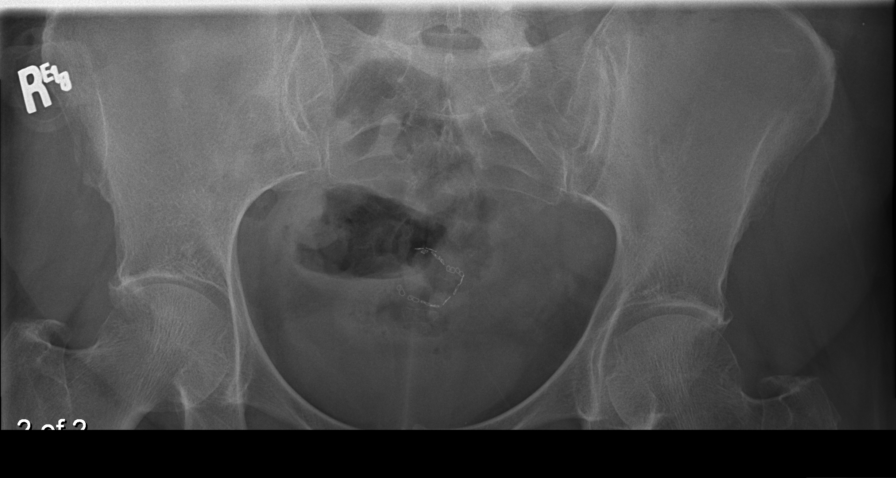

[t abdomen supine (3 of 6)]
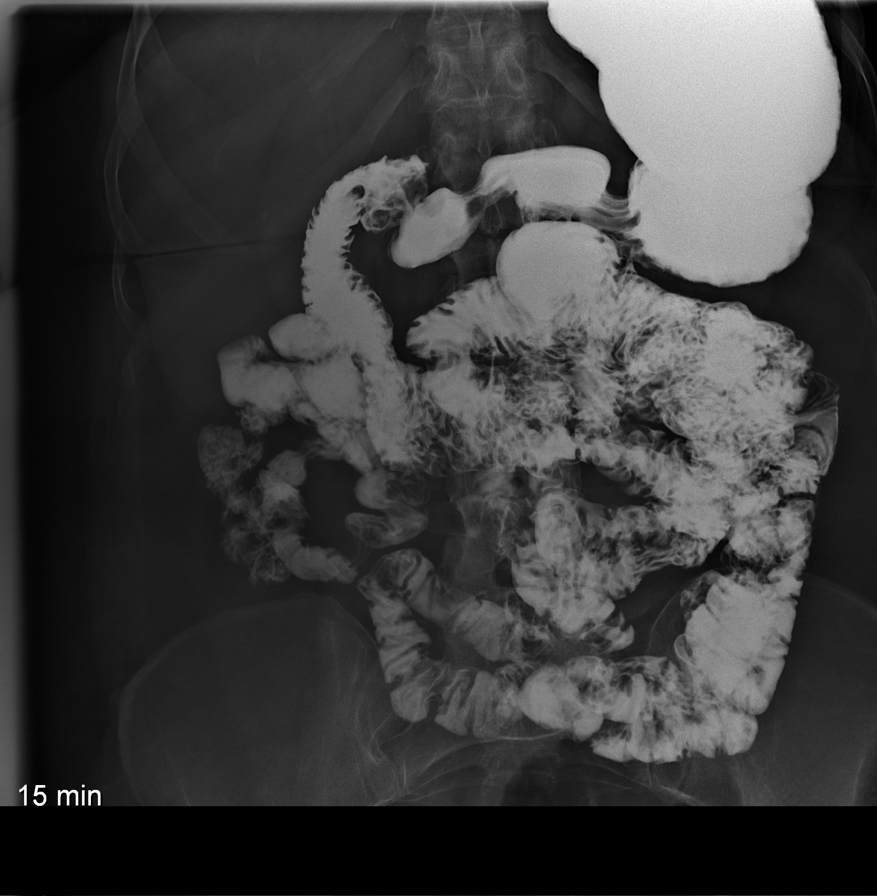

[t abdomen supine (4 of 6)]
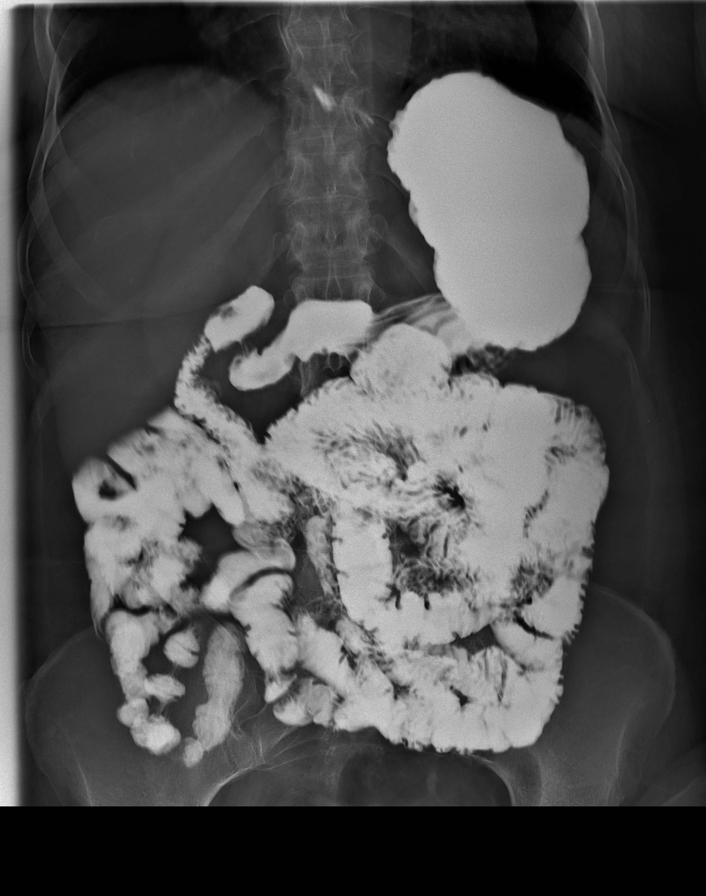

[t abdomen supine (5 of 6)]
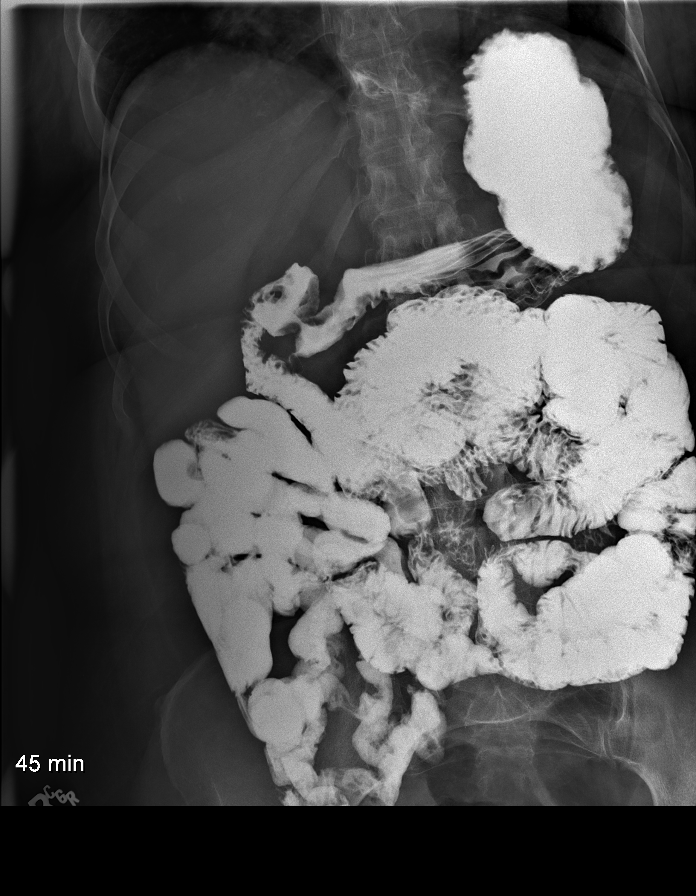

[t abdomen supine (6 of 6)]
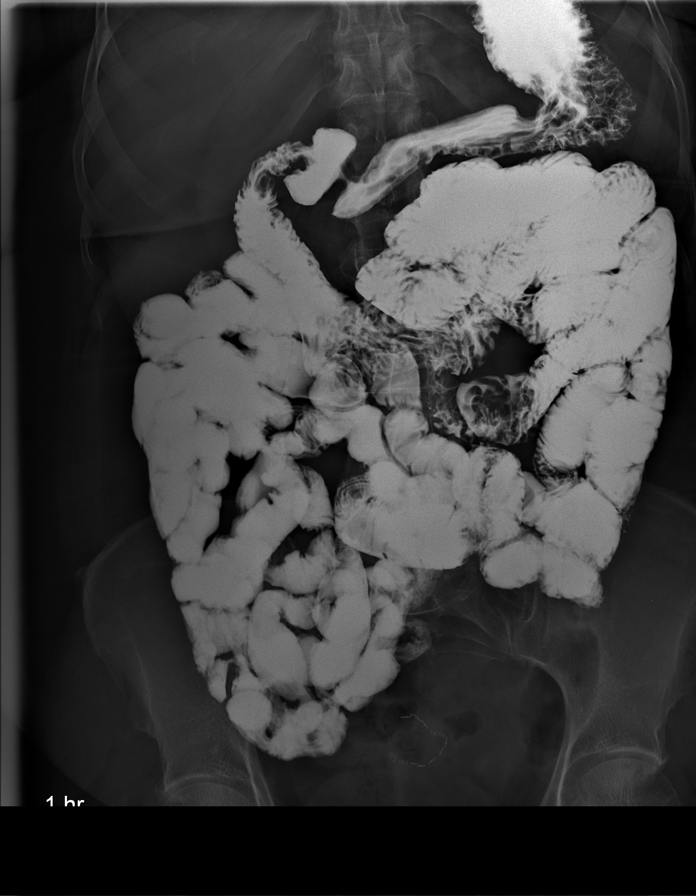

[6 of 6 positions shown; findings below may reference images not displayed]

FINDINGS: Normal transit of the small bowel to the proximal colon in
approximately 1 hour. Normal appearance of the small bowel without
evidence of distention. Bowel suture in the low pelvis.
IMPRESSION: No evidence of small-bowel obstruction. Normal transit of contrast
to the colon.

## 2024-05-20 NOTE — Progress Notes (Signed)
 Update: MAX c.171+4A>G VUS reclassified to benign. Report date is 05/11/2024.
